# Patient Record
Sex: Female | Born: 1960 | Race: Black or African American | Hispanic: No | Marital: Married | Smoking: Never smoker
Health system: Southern US, Community
[De-identification: ages and names within clinical notes are randomized; demographics above are authoritative.]

## PROBLEM LIST (undated history)

## (undated) DIAGNOSIS — E119 Type 2 diabetes mellitus without complications: Secondary | ICD-10-CM

## (undated) DIAGNOSIS — G4730 Sleep apnea, unspecified: Secondary | ICD-10-CM

## (undated) DIAGNOSIS — F1910 Other psychoactive substance abuse, uncomplicated: Secondary | ICD-10-CM

## (undated) DIAGNOSIS — M797 Fibromyalgia: Secondary | ICD-10-CM

## (undated) DIAGNOSIS — G8929 Other chronic pain: Secondary | ICD-10-CM

## (undated) DIAGNOSIS — M7918 Myalgia, other site: Secondary | ICD-10-CM

## (undated) DIAGNOSIS — R202 Paresthesia of skin: Secondary | ICD-10-CM

## (undated) DIAGNOSIS — M7060 Trochanteric bursitis, unspecified hip: Secondary | ICD-10-CM

## (undated) DIAGNOSIS — I10 Essential (primary) hypertension: Secondary | ICD-10-CM

## (undated) DIAGNOSIS — R51 Headache: Secondary | ICD-10-CM

## (undated) DIAGNOSIS — F419 Anxiety disorder, unspecified: Secondary | ICD-10-CM

## (undated) DIAGNOSIS — M47816 Spondylosis without myelopathy or radiculopathy, lumbar region: Secondary | ICD-10-CM

## (undated) DIAGNOSIS — M461 Sacroiliitis, not elsewhere classified: Secondary | ICD-10-CM

## (undated) DIAGNOSIS — M5417 Radiculopathy, lumbosacral region: Secondary | ICD-10-CM

## (undated) DIAGNOSIS — E7800 Pure hypercholesterolemia, unspecified: Secondary | ICD-10-CM

## (undated) DIAGNOSIS — M1990 Unspecified osteoarthritis, unspecified site: Secondary | ICD-10-CM

## (undated) DIAGNOSIS — T7840XA Allergy, unspecified, initial encounter: Secondary | ICD-10-CM

## (undated) HISTORY — DX: Other chronic pain: G89.29

## (undated) HISTORY — DX: Other psychoactive substance abuse, uncomplicated: F19.10

## (undated) HISTORY — DX: Trochanteric bursitis, unspecified hip: M70.60

## (undated) HISTORY — DX: Allergy, unspecified, initial encounter: T78.40XA

## (undated) HISTORY — PX: COLONOSCOPY: SHX174

## (undated) HISTORY — PX: OVARY SURGERY: SHX727

## (undated) HISTORY — DX: Sacroiliitis, not elsewhere classified: M46.1

## (undated) HISTORY — DX: Pure hypercholesterolemia, unspecified: E78.00

## (undated) HISTORY — PX: ABDOMINAL HYSTERECTOMY: SHX81

## (undated) HISTORY — DX: Radiculopathy, lumbosacral region: M54.17

## (undated) HISTORY — DX: Paresthesia of skin: R20.2

## (undated) HISTORY — PX: JOINT REPLACEMENT: SHX530

## (undated) HISTORY — DX: Spondylosis without myelopathy or radiculopathy, lumbar region: M47.816

## (undated) HISTORY — DX: Myalgia, other site: M79.18

---

## 1995-01-22 HISTORY — PX: ABDOMINAL HYSTERECTOMY: SHX81

## 1998-05-31 ENCOUNTER — Other Ambulatory Visit: Admission: RE | Admit: 1998-05-31 | Discharge: 1998-05-31 | Payer: Self-pay | Admitting: Family Medicine

## 1999-02-25 ENCOUNTER — Encounter: Payer: Self-pay | Admitting: Emergency Medicine

## 1999-02-25 ENCOUNTER — Emergency Department (HOSPITAL_COMMUNITY): Admission: EM | Admit: 1999-02-25 | Discharge: 1999-02-25 | Payer: Self-pay | Admitting: Emergency Medicine

## 2001-03-26 ENCOUNTER — Other Ambulatory Visit: Admission: RE | Admit: 2001-03-26 | Discharge: 2001-03-26 | Payer: Self-pay | Admitting: Family Medicine

## 2001-08-07 ENCOUNTER — Emergency Department (HOSPITAL_COMMUNITY): Admission: EM | Admit: 2001-08-07 | Discharge: 2001-08-07 | Payer: Self-pay | Admitting: Emergency Medicine

## 2001-08-07 ENCOUNTER — Encounter: Payer: Self-pay | Admitting: Emergency Medicine

## 2002-12-20 ENCOUNTER — Encounter: Admission: RE | Admit: 2002-12-20 | Discharge: 2002-12-20 | Payer: Self-pay | Admitting: Family Medicine

## 2004-01-22 HISTORY — PX: GASTRIC BYPASS: SHX52

## 2004-04-18 ENCOUNTER — Ambulatory Visit: Payer: Self-pay | Admitting: Internal Medicine

## 2004-04-20 ENCOUNTER — Ambulatory Visit (HOSPITAL_COMMUNITY): Admission: RE | Admit: 2004-04-20 | Discharge: 2004-04-20 | Payer: Self-pay | Admitting: Internal Medicine

## 2004-05-08 ENCOUNTER — Encounter: Admission: RE | Admit: 2004-05-08 | Discharge: 2004-05-08 | Payer: Self-pay | Admitting: Family Medicine

## 2004-05-17 ENCOUNTER — Ambulatory Visit: Payer: Self-pay | Admitting: Pulmonary Disease

## 2004-05-31 ENCOUNTER — Ambulatory Visit (HOSPITAL_BASED_OUTPATIENT_CLINIC_OR_DEPARTMENT_OTHER): Admission: RE | Admit: 2004-05-31 | Discharge: 2004-05-31 | Payer: Self-pay | Admitting: Pulmonary Disease

## 2004-06-12 ENCOUNTER — Ambulatory Visit: Payer: Self-pay | Admitting: Pulmonary Disease

## 2004-06-19 ENCOUNTER — Ambulatory Visit: Payer: Self-pay | Admitting: Pulmonary Disease

## 2004-09-28 ENCOUNTER — Ambulatory Visit (HOSPITAL_COMMUNITY): Admission: RE | Admit: 2004-09-28 | Discharge: 2004-09-28 | Payer: Self-pay | Admitting: *Deleted

## 2004-10-03 ENCOUNTER — Encounter: Admission: RE | Admit: 2004-10-03 | Discharge: 2005-01-01 | Payer: Self-pay | Admitting: *Deleted

## 2004-10-09 ENCOUNTER — Ambulatory Visit (HOSPITAL_COMMUNITY): Admission: RE | Admit: 2004-10-09 | Discharge: 2004-10-09 | Payer: Self-pay | Admitting: *Deleted

## 2004-10-16 ENCOUNTER — Ambulatory Visit: Payer: Self-pay | Admitting: Pulmonary Disease

## 2004-11-20 ENCOUNTER — Ambulatory Visit (HOSPITAL_COMMUNITY): Admission: RE | Admit: 2004-11-20 | Discharge: 2004-11-20 | Payer: Self-pay | Admitting: *Deleted

## 2005-01-01 ENCOUNTER — Inpatient Hospital Stay (HOSPITAL_COMMUNITY): Admission: RE | Admit: 2005-01-01 | Discharge: 2005-01-04 | Payer: Self-pay | Admitting: *Deleted

## 2005-01-10 ENCOUNTER — Encounter: Admission: RE | Admit: 2005-01-10 | Discharge: 2005-04-10 | Payer: Self-pay | Admitting: *Deleted

## 2005-05-14 ENCOUNTER — Encounter: Admission: RE | Admit: 2005-05-14 | Discharge: 2005-05-14 | Payer: Self-pay | Admitting: Family Medicine

## 2006-05-19 ENCOUNTER — Encounter: Admission: RE | Admit: 2006-05-19 | Discharge: 2006-05-19 | Payer: Self-pay | Admitting: Family Medicine

## 2007-05-20 ENCOUNTER — Encounter: Admission: RE | Admit: 2007-05-20 | Discharge: 2007-05-20 | Payer: Self-pay | Admitting: Family Medicine

## 2007-12-25 ENCOUNTER — Other Ambulatory Visit: Admission: RE | Admit: 2007-12-25 | Discharge: 2007-12-25 | Payer: Self-pay | Admitting: Family Medicine

## 2008-05-23 ENCOUNTER — Encounter: Admission: RE | Admit: 2008-05-23 | Discharge: 2008-05-23 | Payer: Self-pay | Admitting: Family Medicine

## 2009-02-14 ENCOUNTER — Encounter: Admission: RE | Admit: 2009-02-14 | Discharge: 2009-02-14 | Payer: Self-pay | Admitting: Family Medicine

## 2009-06-15 ENCOUNTER — Encounter: Admission: RE | Admit: 2009-06-15 | Discharge: 2009-06-15 | Payer: Self-pay | Admitting: Family Medicine

## 2009-08-03 ENCOUNTER — Encounter: Admission: RE | Admit: 2009-08-03 | Discharge: 2009-08-03 | Payer: Self-pay | Admitting: Surgery

## 2010-01-21 HISTORY — PX: BUNIONECTOMY: SHX129

## 2010-02-10 ENCOUNTER — Encounter: Payer: Self-pay | Admitting: Family Medicine

## 2010-06-08 NOTE — Discharge Summary (Signed)
Kelsey Simmons, Kelsey Simmons                ACCOUNT NO.:  [id number]   MEDICAL RECORD NO.:  [id number]          PATIENT TYPE:  INP   LOCATION:  1510                         FACILITY:  [facility]   PHYSICIAN:  Alfonse Ras, MD   DATE OF BIRTH:  15-Dec-1960   DATE OF ADMISSION:  01/01/2005  DATE OF DISCHARGE:  01/04/2005                                 DISCHARGE SUMMARY   ADMISSION DIAGNOSES:  1.  Morbid obesity.  2.  Sleep apnea.  3.  Fibromyalgia.  4.  Mild hypertension.   PROCEDURES:  Laparoscopic Roux-en-Y gastric bypass.   CONDITION ON DISCHARGE:  Stable, good, and improved.   FOLLOW-UP:  With me 1 week after discharge for staple removal.   DISPOSITION:  Discharged to home.   MEDICATIONS:  1.  Roxicet elixir.  2.  __________ 2 mg a day.  3.  Atenolol 100 mg day.  4.  Imitrex p.r.n..   HOSPITAL COURSE:  The patient was admitted after home bowel prep. She  underwent laparoscopic Roux-en-Y gastric bypass, did well. On postoperative  day #1 she underwent upper GI which showed no evidence of leak. She was  followed in the intensive care unit 1 day for her sleep apnea. She did well,  was transferred to the floor, was started on clear liquids and then to her  protein diet. She did well over the next 2 days and was discharged to home  on postoperative day #3 doing well.      Alfonse Ras, MD  Electronically Signed     KRE/MEDQ  D:  01/23/2005  T:  01/23/2005  Job:  [id number]

## 2010-06-08 NOTE — Procedures (Signed)
NAMEKAMEREN, BAADE NO.:  [id number]   MEDICAL RECORD NO.:  [id number]          PATIENT TYPE:  OUT   LOCATION:  SLEEP CENTER                 FACILITY:  [facility]   PHYSICIAN:  Marcelyn Bruins, M.D. [facility] DATE OF BIRTH:  1960/09/20   DATE OF STUDY:  05/31/2004                              NOCTURNAL POLYSOMNOGRAM   REFERRING PHYSICIAN:  Marcelyn Bruins, M.D.   LOCATION:  Sleep lab.   INDICATION FOR THE STUDY:  Hypersomnia with sleep apnea. Epworth score: 22.   SLEEP ARCHITECTURE:  The patient had a total sleep time of 389 minutes with  a sleep efficiency of 91%. There was very decreased slow wave sleep and  borderline REM quantity. Sleep onset latency was normal and REM onset was  somewhat prolonged.   IMPRESSION:  1.  Mild to moderate obstructive sleep apnea/hypopnea syndrome with a      respiratory disturbance index of 16 events per hour and O2 desaturation      as low as 77%. The events were not positional, but occurred primarily      during REM.  Treatment for this degree of sleep apnea can include weight      loss, upper airway surgery, oral appliance or possibly CPAP. Clinical      correlation is suggested.  2.  Mild to moderate snoring noted throughout.  3.  No clinically significant cardiac arrhythmias.      KC/MEDQ  D:  06/12/2004 16:46:52  T:  06/12/2004 18:58:39  Job:  [id number]

## 2010-06-08 NOTE — Op Note (Signed)
Kelsey Simmons, Kelsey Simmons                ACCOUNT NO.:  [id number]   MEDICAL RECORD NO.:  [id number]          PATIENT TYPE:  INP   LOCATION:  0154                         FAC[facility]   PHYSICIAN:  Vikki Ports, MDDATE OF BIRTH:  December 23, 1960   DATE OF PROCEDURE:  01/01/2005  DATE OF DISCHARGE:                                 OPERATIVE REPORT   PREOPERATIVE DIAGNOSIS:  Morbid obesity.   POSTOPERATIVE DIAGNOSIS:  Morbid obesity.   PROCEDURE:  Laparoscopic Roux-en-Y gastric bypass, left facing roux limb.   SURGEON:  Vikki Ports, MD.   ASSISTANT:  Glenna Fellows, MD.   ANESTHESIA:  General.   DESCRIPTION OF PROCEDURE:  The patient was taken to the operating room,  placed in a supine position and after adequate general anesthesia was  induced using the endotracheal tube, the abdomen was prepped and draped in  the normal sterile fashion. Using a left upper quadrant incision, a 12 mm  Optiview trocar was inserted and using direct vision, peritoneal access was  obtained without difficulty. Pneumoperitoneum was obtained. Under direct  vision, additional 12 mm trocars were placed in the right upper quadrant and  in the mid abdomen. The ligament of Treitz was identified and the small  bowel was transected using a 60 mm echelon white load stapling device 40 cm  distal to the ligament of Treitz. The distal end was marked with a Penrose  drain. Marching 100 cm distal to this, a side-to-side jejunojejunostomy was  performed in the standard fashion using a 45-mm white load GIA stapling  device. The defect was closed with running 2-0 Vicryl sutures. I was  satisfied with the anastomosis. The mesenteric defect was closed with a  running 2-0 silk suture. The anastomosis was inspected and reinforced using  Tisseel. I then turned my attention to the upper abdomen. The patient was  placed in steep head-up position. A Nathanson liver retractor was placed and  the left lateral  segment of liver was retracted anteriorly. The angle of His  was sharply and bluntly dissected. An area on the lesser curve was  identified 4 cm distal to the EG junction. Everything was removed from the  stomach and the lesser sac was entered. The 60 mm echelon stapler was used  to create the stomach pouch with serial firings of the echelon stapler. A  very nice small pouch was created up to the angle of His. The Ewald tube was  placed transorally to ensure patency of the EG junction. After a nice pouch  was created and adequate hemostasis was assured, the remnant was oversewn  with a running interlocking 2-0 silk suture. The Roux limb was then brought  up in a left facing position and laid next to the gastric pouch. The  posterior layer was then run with a 2-0 Vicryl suture. Gastrotomy and  enterotomy were then made and a 45 white load GIA stapler was then inserted  into the stomach and into the small bowel and anastomosis was created. This  laid nicely. The defect was closed with running 2-0 Vicryl suture. The Maricela Curet  was  then placed down through the anastomosis and the anterior serosal layer  was run with a 2-0 Vicryl suture. The anastomosis was inspected after the  Roux limb was clamped with a DeBakey. Dr. Johna Sheriff performed the endoscopy  which showed no evidence of leak and patency of the anastomosis. Peterson's  defect was closed with running 2-0 silk suture. The [facility] liver  retractor was removed. Of note, there  was a little bit of bleeding from the staple line prior to closure of the  gastrojejunostomy and this was controlled with clips. Pneumoperitoneum was  released after the [facility] liver retractor was removed. Trocars __________  incisions were closed with staples. The patient tolerated the procedure well  and went to PACU in good condition.      Vikki Ports, MD  Electronically Signed     KRH/MEDQ  D:  01/01/2005  T:  01/02/2005  Job:  [id number]

## 2010-06-08 NOTE — Op Note (Signed)
NAMEBLASA, RAISCH                ACCOUNT NO.:  [id number]   MEDICAL RECORD NO.:  [id number]          PATIENT TYPE:  INP   LOCATION:  0154                         FACILITY:  [facility]   PHYSICIAN:  Sharlet Salina T. Hoxworth, M.D.DATE OF BIRTH:  Jul 29, 1960   DATE OF PROCEDURE:  01/01/2005  DATE OF DISCHARGE:                                 OPERATIVE REPORT   a   PROCEDURE:  Upper GI endoscopy.   DESCRIPTION OF PROCEDURE:  Upper GI endoscopy is performed at the completion  of laparoscopic roux-en-Y gastric bypass by Dr. Luan Pulling. The Olympus  video endoscope was inserted into the upper esophagus and passed under  direct vision to the EG junction at approximately 40 cm. The small gastric  pouch was entered and was tensely distended with air and with the pouch  being inspected under saline irrigation with the outlet clamped, there was  no evidence of leak. The suture and staple lines were intact without  bleeding. The anastomosis was patent. The pouch measured 4.5 cm in length.  Following this, it was decompressed and the scope withdrawn. The patient  tolerated the procedure well.      Lorne Skeens. Hoxworth, M.D.  Electronically Signed     BTH/MEDQ  D:  01/01/2005  T:  01/02/2005  Job:  [id number]

## 2010-07-02 ENCOUNTER — Other Ambulatory Visit: Payer: Self-pay | Admitting: Family Medicine

## 2010-07-02 DIAGNOSIS — Z1231 Encounter for screening mammogram for malignant neoplasm of breast: Secondary | ICD-10-CM

## 2010-07-09 ENCOUNTER — Ambulatory Visit
Admission: RE | Admit: 2010-07-09 | Discharge: 2010-07-09 | Disposition: A | Discharge status: Home / Self Care | Payer: Self-pay | Source: Ambulatory Visit | Attending: Family Medicine | Admitting: Family Medicine

## 2010-07-09 DIAGNOSIS — Z1231 Encounter for screening mammogram for malignant neoplasm of breast: Secondary | ICD-10-CM

## 2010-07-12 ENCOUNTER — Other Ambulatory Visit: Payer: Self-pay | Admitting: Family Medicine

## 2010-07-12 DIAGNOSIS — R928 Other abnormal and inconclusive findings on diagnostic imaging of breast: Secondary | ICD-10-CM

## 2010-07-20 ENCOUNTER — Ambulatory Visit
Admission: RE | Admit: 2010-07-20 | Discharge: 2010-07-20 | Disposition: A | Discharge status: Home / Self Care | Payer: BC Managed Care – PPO | Source: Ambulatory Visit | Attending: Family Medicine | Admitting: Family Medicine

## 2010-07-20 DIAGNOSIS — R928 Other abnormal and inconclusive findings on diagnostic imaging of breast: Secondary | ICD-10-CM

## 2011-08-02 ENCOUNTER — Other Ambulatory Visit: Payer: Self-pay | Admitting: Family Medicine

## 2011-08-02 DIAGNOSIS — Z1231 Encounter for screening mammogram for malignant neoplasm of breast: Secondary | ICD-10-CM

## 2011-08-13 ENCOUNTER — Ambulatory Visit
Admission: RE | Admit: 2011-08-13 | Discharge: 2011-08-13 | Disposition: A | Discharge status: Home / Self Care | Payer: BC Managed Care – PPO | Source: Ambulatory Visit | Attending: Family Medicine | Admitting: Family Medicine

## 2011-08-13 DIAGNOSIS — Z1231 Encounter for screening mammogram for malignant neoplasm of breast: Secondary | ICD-10-CM

## 2012-07-29 ENCOUNTER — Other Ambulatory Visit: Payer: Self-pay

## 2012-07-29 DIAGNOSIS — Z1231 Encounter for screening mammogram for malignant neoplasm of breast: Secondary | ICD-10-CM

## 2012-08-19 ENCOUNTER — Ambulatory Visit
Admission: RE | Admit: 2012-08-19 | Discharge: 2012-08-19 | Disposition: A | Discharge status: Home / Self Care | Payer: BC Managed Care – PPO | Source: Ambulatory Visit

## 2012-08-19 DIAGNOSIS — Z1231 Encounter for screening mammogram for malignant neoplasm of breast: Secondary | ICD-10-CM

## 2012-11-09 DIAGNOSIS — M461 Sacroiliitis, not elsewhere classified: Secondary | ICD-10-CM | POA: Insufficient documentation

## 2013-07-20 ENCOUNTER — Other Ambulatory Visit: Payer: Self-pay | Admitting: Surgical

## 2013-07-29 ENCOUNTER — Other Ambulatory Visit (HOSPITAL_COMMUNITY): Payer: Self-pay | Admitting: Orthopedic Surgery

## 2013-07-29 DIAGNOSIS — M1711 Unilateral primary osteoarthritis, right knee: Secondary | ICD-10-CM

## 2013-07-30 ENCOUNTER — Ambulatory Visit (HOSPITAL_COMMUNITY)
Admission: RE | Admit: 2013-07-30 | Discharge: 2013-07-30 | Disposition: A | Discharge status: Home / Self Care | Payer: BC Managed Care – PPO | Source: Ambulatory Visit | Attending: Cardiovascular Disease | Admitting: Cardiovascular Disease

## 2013-07-30 DIAGNOSIS — M1710 Unilateral primary osteoarthritis, unspecified knee: Secondary | ICD-10-CM | POA: Insufficient documentation

## 2013-07-30 DIAGNOSIS — M1711 Unilateral primary osteoarthritis, right knee: Secondary | ICD-10-CM

## 2013-07-30 NOTE — Progress Notes (Signed)
Right Lower Extremity Venous Duplex Completed. No evidence for DVT or SVT. °Brianna L Mazza,RVT °

## 2013-08-03 ENCOUNTER — Telehealth (HOSPITAL_COMMUNITY): Payer: Self-pay | Admitting: *Deleted

## 2013-08-03 ENCOUNTER — Other Ambulatory Visit: Payer: Self-pay | Admitting: Surgical

## 2013-08-03 ENCOUNTER — Encounter (HOSPITAL_COMMUNITY): Payer: Self-pay | Admitting: Pharmacy Technician

## 2013-08-04 NOTE — H&P (Signed)
TOTAL KNEE ADMISSION H&P  Patient is being admitted for right total knee arthroplasty.  Subjective:  Chief Complaint:right knee pain.  HPI: Kelsey Simmons, 53 y.o. female, has a history of pain and functional disability in the right knee due to arthritis and has failed non-surgical conservative treatments for greater than 12 weeks to includeNSAID's and/or analgesics, corticosteriod injections, viscosupplementation injections, use of assistive devices, weight reduction as appropriate and activity modification.  Onset of symptoms was gradual, starting 5 years ago with gradually worsening course since that time. The patient noted no past surgery on the right knee(s).  Patient currently rates pain in the right knee(s) at 8 out of 10 with activity. Patient has night pain, worsening of pain with activity and weight bearing, pain that interferes with activities of daily living, pain with passive range of motion, crepitus and joint swelling.  Patient has evidence of periarticular osteophytes and joint space narrowing by imaging studies. There is no active infection.  Past Medical History Migraines Hypertension Sleep Apnea (not currently using CPAP) Varicose Veins Osteoarthritis Fibromyalgia Degenerative Disc Disease, lumbar spine  Past Surgical History Cesarean Delivery Hysterectomy Gastric Bypass Bunionectomy  No Known Allergies    Current outpatient prescriptions: aspirin EC 325 MG tablet, Take 325 mg by mouth 2 (two) times daily., Disp: , Rfl: ;   celecoxib (CELEBREX) 200 MG capsule, Take 200 mg by mouth daily., Disp: , Rfl: ;   DULoxetine (CYMBALTA) 60 MG capsule, Take 60 mg by mouth every evening., Disp: , Rfl: ;   hydrochlorothiazide (HYDRODIURIL) 25 MG tablet, Take 25 mg by mouth every morning., Disp: , Rfl:  SUMAtriptan (IMITREX) 100 MG tablet, Take 100 mg by mouth every 2 (two) hours as needed for migraine or headache. May repeat in 2 hours if headache persists or recurs., Disp: ,  Rfl: ;  traMADol (ULTRAM) 50 MG tablet, Take 50 mg by mouth every 6 (six) hours as needed (Pain)., Disp: , Rfl:   History  Substance Use Topics  . Smoking status: No  . Smokeless tobacco: No  . Alcohol Use: No    Family History Lung cancer Diabetes mellitus type II Lupus Hypertension   Review of Systems  Constitutional: Negative.   HENT: Negative for congestion, ear discharge, ear pain, hearing loss, nosebleeds, sore throat and tinnitus.        History of migraines  Eyes: Negative.   Respiratory: Negative.  Negative for stridor.   Cardiovascular: Negative.   Gastrointestinal: Negative.   Genitourinary: Negative.   Musculoskeletal: Positive for back pain and joint pain. Negative for falls, myalgias and neck pain.       Bilateral knee pain  Skin: Negative.   Neurological: Positive for headaches. Negative for dizziness, tingling, tremors, sensory change, speech change, focal weakness, seizures and loss of consciousness.  Endo/Heme/Allergies: Negative.   Psychiatric/Behavioral: Negative.     Objective:  Physical Exam  Constitutional: She is oriented to person, place, and time. She appears well-developed. No distress.  Obese  HENT:  Head: Normocephalic and atraumatic.  Right Ear: External ear normal.  Left Ear: External ear normal.  Nose: Nose normal.  Mouth/Throat: Oropharynx is clear and moist.  Eyes: Conjunctivae and EOM are normal.  Neck: Normal range of motion. Neck supple.  Cardiovascular: Normal rate, regular rhythm, normal heart sounds and intact distal pulses.   No murmur heard. Respiratory: Effort normal and breath sounds normal. No respiratory distress. She has no wheezes.  GI: Soft. Bowel sounds are normal. She exhibits no distension. There is  no tenderness.  Musculoskeletal:       Right hip: Normal.       Left hip: Normal.       Right knee: She exhibits decreased range of motion and swelling. She exhibits no effusion and no erythema. Tenderness found.  Medial joint line and lateral joint line tenderness noted.       Left knee: She exhibits decreased range of motion and swelling. She exhibits no effusion and no erythema. Tenderness found. Medial joint line and lateral joint line tenderness noted.       Right lower leg: She exhibits no tenderness and no swelling.       Left lower leg: She exhibits no tenderness and no swelling.  Neurological: She is alert and oriented to person, place, and time. She has normal strength and normal reflexes. No sensory deficit.  Skin: No rash noted. No erythema.  Psychiatric: She has a normal mood and affect. Her behavior is normal.   Vitals  Weight: 242 lb Height: 61in Body Surface Area: 2.17 m Body Mass Index: 45.73 kg/m Pulse: 76 (Regular)  BP: 142/88 (Sitting, Left Arm, Standard)  Imaging Review Plain radiographs demonstrate severe degenerative joint disease of the right knee(s). The overall alignment ismild varus. The bone quality appears to be good for age and reported activity level.  Assessment/Plan:  End stage arthritis, right knee   The patient history, physical examination, clinical judgment of the provider and imaging studies are consistent with end stage degenerative joint disease of the right knee(s) and total knee arthroplasty is deemed medically necessary. The treatment options including medical management, injection therapy arthroscopy and arthroplasty were discussed at length. The risks and benefits of total knee arthroplasty were presented and reviewed. The risks due to aseptic loosening, infection, stiffness, patella tracking problems, thromboembolic complications and other imponderables were discussed. The patient acknowledged the explanation, agreed to proceed with the plan and consent was signed. Patient is being admitted for inpatient treatment for surgery, pain control, PT, OT, prophylactic antibiotics, VTE prophylaxis, progressive ambulation and ADL's and discharge planning.  The patient is planning to be discharged home with home health services    North HartlandAmber Hanna Aultman, [place] JerseyPA-C

## 2013-08-05 ENCOUNTER — Other Ambulatory Visit (HOSPITAL_COMMUNITY): Payer: Self-pay | Admitting: *Deleted

## 2013-08-06 ENCOUNTER — Ambulatory Visit (HOSPITAL_COMMUNITY)
Admission: RE | Admit: 2013-08-06 | Discharge: 2013-08-06 | Disposition: A | Discharge status: Home / Self Care | Payer: BC Managed Care – PPO | Source: Ambulatory Visit | Attending: Surgical | Admitting: Surgical

## 2013-08-06 ENCOUNTER — Encounter (HOSPITAL_COMMUNITY): Payer: Self-pay

## 2013-08-06 ENCOUNTER — Encounter (INDEPENDENT_AMBULATORY_CARE_PROVIDER_SITE_OTHER): Payer: Self-pay

## 2013-08-06 ENCOUNTER — Encounter (HOSPITAL_COMMUNITY)
Admission: RE | Admit: 2013-08-06 | Discharge: 2013-08-06 | Disposition: A | Discharge status: Home / Self Care | Payer: BC Managed Care – PPO | Source: Ambulatory Visit | Attending: Orthopedic Surgery | Admitting: Orthopedic Surgery

## 2013-08-06 DIAGNOSIS — I10 Essential (primary) hypertension: Secondary | ICD-10-CM | POA: Insufficient documentation

## 2013-08-06 DIAGNOSIS — M47814 Spondylosis without myelopathy or radiculopathy, thoracic region: Secondary | ICD-10-CM | POA: Insufficient documentation

## 2013-08-06 DIAGNOSIS — Z01810 Encounter for preprocedural cardiovascular examination: Secondary | ICD-10-CM | POA: Insufficient documentation

## 2013-08-06 DIAGNOSIS — Z01812 Encounter for preprocedural laboratory examination: Secondary | ICD-10-CM | POA: Insufficient documentation

## 2013-08-06 DIAGNOSIS — Z01818 Encounter for other preprocedural examination: Secondary | ICD-10-CM | POA: Insufficient documentation

## 2013-08-06 HISTORY — DX: Headache: R51

## 2013-08-06 HISTORY — DX: Fibromyalgia: M79.7

## 2013-08-06 HISTORY — DX: Essential (primary) hypertension: I10

## 2013-08-06 HISTORY — DX: Unspecified osteoarthritis, unspecified site: M19.90

## 2013-08-06 HISTORY — DX: Sleep apnea, unspecified: G47.30

## 2013-08-06 LAB — URINALYSIS, ROUTINE W REFLEX MICROSCOPIC
Bilirubin Urine: NEGATIVE
Glucose, UA: NEGATIVE mg/dL
Hgb urine dipstick: NEGATIVE
Ketones, ur: NEGATIVE mg/dL
Leukocytes, UA: NEGATIVE
Nitrite: NEGATIVE
Protein, ur: NEGATIVE mg/dL
Specific Gravity, Urine: 1.023 (ref 1.005–1.030)
Urobilinogen, UA: 1 mg/dL (ref 0.0–1.0)
pH: 5.5 (ref 5.0–8.0)

## 2013-08-06 LAB — COMPREHENSIVE METABOLIC PANEL
ALT: 13 U/L (ref 0–35)
AST: 24 U/L (ref 0–37)
Albumin: 3.5 g/dL (ref 3.5–5.2)
Alkaline Phosphatase: 82 U/L (ref 39–117)
Anion gap: 13 (ref 5–15)
BUN: 23 mg/dL (ref 6–23)
CO2: 29 mEq/L (ref 19–32)
Calcium: 10 mg/dL (ref 8.4–10.5)
Chloride: 98 mEq/L (ref 96–112)
Creatinine, Ser: 0.97 mg/dL (ref 0.50–1.10)
GFR calc Af Amer: 77 mL/min — ABNORMAL LOW (ref >90)
GFR calc non Af Amer: 66 mL/min — ABNORMAL LOW (ref >90)
Glucose, Bld: 95 mg/dL (ref 70–99)
Potassium: 4.5 mEq/L (ref 3.7–5.3)
Sodium: 140 mEq/L (ref 137–147)
Total Bilirubin: <0.2 mg/dL — ABNORMAL LOW (ref 0.3–1.2)
Total Protein: 8.1 g/dL (ref 6.0–8.3)

## 2013-08-06 LAB — SURGICAL PCR SCREEN
MRSA, PCR: NEGATIVE
STAPHYLOCOCCUS AUREUS: NEGATIVE

## 2013-08-06 LAB — CBC
HCT: 39.8 % (ref 36.0–46.0)
HEMOGLOBIN: 12.8 g/dL (ref 12.0–15.0)
MCH: 27.8 pg (ref 26.0–34.0)
MCHC: 32.2 g/dL (ref 30.0–36.0)
MCV: 86.5 fL (ref 78.0–100.0)
Platelets: 288 10*3/uL (ref 150–400)
RBC: 4.6 MIL/uL (ref 3.87–5.11)
RDW: 15.3 % (ref 11.5–15.5)
WBC: 9.9 10*3/uL (ref 4.0–10.5)

## 2013-08-06 LAB — PROTIME-INR
INR: 1.07 (ref 0.00–1.49)
Prothrombin Time: 13.9 seconds (ref 11.6–15.2)

## 2013-08-06 LAB — APTT: aPTT: 31 seconds (ref 24–37)

## 2013-08-06 NOTE — Patient Instructions (Addendum)
Kelsey Simmons  08/06/2013                           YOUR PROCEDURE IS SCHEDULED ON: 08/18/13 AT 10:00 AM               ENTER THRU [facility] MAIN [facility]ITAL ENTRANCE AND                            FOLLOW  SIGNS TO SHORT STAY CENTER                 ARRIVE AT SHORT STAY AT: 8:00 AM               CALL THIS NUMBER IF ANY PROBLEMS THE DAY OF SURGERY :               [phone]                                REMEMBER:   Do not eat food or drink liquids AFTER MIDNIGHT                 Take these medicines the morning of surgery with               A SIPS OF WATER :  ULTRAM IF NEEDED       Do not wear jewelry, make-up   Do not wear lotions, powders, or perfumes.   Do not shave legs or underarms 12 hrs. before surgery (men may shave face)  Do not bring valuables to the hospital.  Contacts, dentures or bridgework may not be worn into surgery.  Leave suitcase in the car. After surgery it may be brought to your room.  For patients admitted to the hospital more than one night, checkout time is            11:00 AM                                                       ________________________________________________________________________                                                                        [place] - PREPARING FOR SURGERY  Before surgery, you can play an important role.  Because skin is not sterile, your skin needs to be as free of germs as possible.  You can reduce the number of germs on your skin by washing with CHG (chlorahexidine gluconate) soap before surgery.  CHG is an antiseptic cleaner which kills germs and bonds with the skin to continue killing germs even after washing. Please DO NOT use if you have an allergy to CHG or antibacterial soaps.  If your skin becomes reddened/irritated stop using the CHG and inform your nurse when you arrive at Short Stay. Do not shave (including legs and underarms) for at least 48 hours prior to the first CHG shower.   You  may shave your face. Please follow these instructions carefully:   1.  Shower with CHG Soap the night before surgery and the  morning of Surgery.   2.  If you choose to wash your hair, wash your hair first as usual with your  normal  Shampoo.   3.  After you shampoo, rinse your hair and body thoroughly to remove the  shampoo.                                         4.  Use CHG as you would any other liquid soap.  You can apply chg directly  to the skin and wash . Gently wash with scrungie or clean wascloth    5.  Apply the CHG Soap to your body ONLY FROM THE NECK DOWN.   Do not use on open                           Wound or open sores. Avoid contact with eyes, ears mouth and genitals (private parts).                        Genitals (private parts) with your normal soap.              6.  Wash thoroughly, paying special attention to the area where your surgery  will be performed.   7.  Thoroughly rinse your body with warm water from the neck down.   8.  DO NOT shower/wash with your normal soap after using and rinsing off  the CHG Soap .                9.  Pat yourself dry with a clean towel.             10.  Wear clean pajamas.             11.  Place clean sheets on your bed the night of your first shower and do not  sleep with pets.  Day of Surgery : Do not apply any lotions/deodorants the morning of surgery.  Please wear clean clothes to the hospital/surgery center.  FAILURE TO FOLLOW THESE INSTRUCTIONS MAY RESULT IN THE CANCELLATION OF YOUR SURGERY    PATIENT SIGNATURE_________________________________  ______________________________________________________________________     [name]  An incentive spirometer is a tool that can help keep your lungs clear and active. This tool measures how well you are filling your lungs with each breath. Taking long deep breaths may help reverse or decrease the chance of developing breathing (pulmonary) problems  (especially infection) following:  A long period of time when you are unable to move or be active. BEFORE THE PROCEDURE   If the spirometer includes an indicator to show your best effort, your nurse or respiratory therapist will set it to a desired goal.  If possible, sit up straight or lean slightly forward. Try not to slouch.  Hold the incentive spirometer in an upright position. INSTRUCTIONS FOR USE  1. Sit on the edge of your bed if possible, or sit up as far as you can in bed or on a chair. 2. Hold the incentive spirometer in an upright position. 3. Breathe out normally. 4. Place the mouthpiece in your mouth and seal your lips tightly around it. 5. Breathe in slowly and as deeply  as possible, raising the piston or the ball toward the top of the column. 6. Hold your breath for 3-5 seconds or for as long as possible. Allow the piston or ball to fall to the bottom of the column. 7. Remove the mouthpiece from your mouth and breathe out normally. 8. Rest for a few seconds and repeat Steps 1 through 7 at least 10 times every 1-2 hours when you are awake. Take your time and take a few normal breaths between deep breaths. 9. The spirometer may include an indicator to show your best effort. Use the indicator as a goal to work toward during each repetition. 10. After each set of 10 deep breaths, practice coughing to be sure your lungs are clear. If you have an incision (the cut made at the time of surgery), support your incision when coughing by placing a pillow or rolled up towels firmly against it. Once you are able to get out of bed, walk around indoors and cough well. You may stop using the incentive spirometer when instructed by your caregiver.  RISKS AND COMPLICATIONS  Take your time so you do not get dizzy or light-headed.  If you are in pain, you may need to take or ask for pain medication before doing incentive spirometry. It is harder to take a deep breath if you are having  pain. AFTER USE  Rest and breathe slowly and easily.  It can be helpful to keep track of a log of your progress. Your caregiver can provide you with a simple table to help with this. If you are using the spirometer at home, follow these instructions: SEEK MEDICAL CARE IF:   You are having difficultly using the spirometer.  You have trouble using the spirometer as often as instructed.  Your pain medication is not giving enough relief while using the spirometer.  You develop fever of 100.5 F (38.1 C) or higher. SEEK IMMEDIATE MEDICAL CARE IF:   You cough up bloody sputum that had not been present before.  You develop fever of 102 F (38.9 C) or greater.  You develop worsening pain at or near the incision site. MAKE SURE YOU:   Understand these instructions.  Will watch your condition.  Will get help right away if you are not doing well or get worse. Document Released: 05/20/2006 Document Revised: 04/01/2011 Document Reviewed: 07/21/2006 ExitCare Patient Information 2014 ExitCare, [place].   ________________________________________________________________________  WHAT IS A BLOOD TRANSFUSION? Blood Transfusion Information  A transfusion is the replacement of blood or some of its parts. Blood is made up of multiple cells which provide different functions.  Red blood cells carry oxygen and are used for blood loss replacement.  White blood cells fight against infection.  Platelets control bleeding.  Plasma helps clot blood.  Other blood products are available for specialized needs, such as hemophilia or other clotting disorders. BEFORE THE TRANSFUSION  Who gives blood for transfusions?   Healthy volunteers who are fully evaluated to make sure their blood is safe. This is blood bank blood. Transfusion therapy is the safest it has ever been in the practice of medicine. Before blood is taken from a donor, a complete history is taken to make sure that person has no history  of diseases nor engages in risky social behavior (examples are intravenous drug use or sexual activity with multiple partners). The donor's travel history is screened to minimize risk of transmitting infections, such as malaria. The donated blood is tested for signs of infectious diseases,  such as HIV and hepatitis. The blood is then tested to be sure it is compatible with you in order to minimize the chance of a transfusion reaction. If you or a relative donates blood, this is often done in anticipation of surgery and is not appropriate for emergency situations. It takes many days to process the donated blood. RISKS AND COMPLICATIONS Although transfusion therapy is very safe and saves many lives, the main dangers of transfusion include:   Getting an infectious disease.  Developing a transfusion reaction. This is an allergic reaction to something in the blood you were given. Every precaution is taken to prevent this. The decision to have a blood transfusion has been considered carefully by your caregiver before blood is given. Blood is not given unless the benefits outweigh the risks. AFTER THE TRANSFUSION  Right after receiving a blood transfusion, you will usually feel much better and more energetic. This is especially true if your red blood cells have gotten low (anemic). The transfusion raises the level of the red blood cells which carry oxygen, and this usually causes an energy increase.  The nurse administering the transfusion will monitor you carefully for complications. HOME CARE INSTRUCTIONS  No special instructions are needed after a transfusion. You may find your energy is better. Speak with your caregiver about any limitations on activity for underlying diseases you may have. SEEK MEDICAL CARE IF:   Your condition is not improving after your transfusion.  You develop redness or irritation at the intravenous (IV) site. SEEK IMMEDIATE MEDICAL CARE IF:  Any of the following symptoms  occur over the next 12 hours:  Shaking chills.  You have a temperature by mouth above 102 F (38.9 C), not controlled by medicine.  Chest, back, or muscle pain.  People around you feel you are not acting correctly or are confused.  Shortness of breath or difficulty breathing.  Dizziness and fainting.  You get a rash or develop hives.  You have a decrease in urine output.  Your urine turns a dark color or changes to pink, red, or brown. Any of the following symptoms occur over the next 10 days:  You have a temperature by mouth above 102 F (38.9 C), not controlled by medicine.  Shortness of breath.  Weakness after normal activity.  The white part of the eye turns yellow (jaundice).  You have a decrease in the amount of urine or are urinating less often.  Your urine turns a dark color or changes to pink, red, or brown. Document Released: 01/05/2000 Document Revised: 04/01/2011 Document Reviewed: 08/24/2007 [facility] Patient Information 2014 [place], [place].  _______________________________________________________________________

## 2013-08-06 NOTE — Progress Notes (Signed)
08/06/13 1405  OBSTRUCTIVE SLEEP APNEA  Have you ever been diagnosed with sleep apnea through a sleep study? Yes  If yes, do you have and use a CPAP or BPAP machine every night? 0 (stopped using c pap 2007 after wt loss)  Do you snore loudly (loud enough to be heard through closed doors)?  1  Do you often feel tired, fatigued, or sleepy during the daytime? 1  Has anyone observed you stop breathing during your sleep? 1  Do you have, or are you being treated for high blood pressure? 1  BMI more than 35 kg/m2? 1  Age over 52 years old? 1  Neck circumference greater than 40 cm/16 inches? 0  Gender: 0  Obstructive Sleep Apnea Score 6

## 2013-08-18 ENCOUNTER — Encounter (HOSPITAL_COMMUNITY)
Admission: RE | Disposition: A | Discharge status: Home Health | Payer: Self-pay | Source: Ambulatory Visit | Attending: Orthopedic Surgery

## 2013-08-18 ENCOUNTER — Inpatient Hospital Stay (HOSPITAL_COMMUNITY): Payer: BC Managed Care – PPO

## 2013-08-18 ENCOUNTER — Encounter (HOSPITAL_COMMUNITY): Payer: BC Managed Care – PPO | Admitting: Anesthesiology

## 2013-08-18 ENCOUNTER — Inpatient Hospital Stay (HOSPITAL_COMMUNITY): Payer: BC Managed Care – PPO | Admitting: Anesthesiology

## 2013-08-18 ENCOUNTER — Encounter (HOSPITAL_COMMUNITY): Payer: Self-pay | Admitting: *Deleted

## 2013-08-18 ENCOUNTER — Inpatient Hospital Stay (HOSPITAL_COMMUNITY)
Admission: RE | Admit: 2013-08-18 | Discharge: 2013-08-21 | DRG: 470 | Disposition: A | Discharge status: Home Health | Payer: BC Managed Care – PPO | Source: Ambulatory Visit | Attending: Orthopedic Surgery | Admitting: Orthopedic Surgery

## 2013-08-18 DIAGNOSIS — M1711 Unilateral primary osteoarthritis, right knee: Secondary | ICD-10-CM | POA: Diagnosis present

## 2013-08-18 DIAGNOSIS — M51379 Other intervertebral disc degeneration, lumbosacral region without mention of lumbar back pain or lower extremity pain: Secondary | ICD-10-CM | POA: Diagnosis present

## 2013-08-18 DIAGNOSIS — M24569 Contracture, unspecified knee: Secondary | ICD-10-CM | POA: Diagnosis present

## 2013-08-18 DIAGNOSIS — Z833 Family history of diabetes mellitus: Secondary | ICD-10-CM

## 2013-08-18 DIAGNOSIS — M1710 Unilateral primary osteoarthritis, unspecified knee: Principal | ICD-10-CM | POA: Diagnosis present

## 2013-08-18 DIAGNOSIS — Z96651 Presence of right artificial knee joint: Secondary | ICD-10-CM

## 2013-08-18 DIAGNOSIS — I10 Essential (primary) hypertension: Secondary | ICD-10-CM | POA: Diagnosis present

## 2013-08-18 DIAGNOSIS — Z801 Family history of malignant neoplasm of trachea, bronchus and lung: Secondary | ICD-10-CM

## 2013-08-18 DIAGNOSIS — Z79899 Other long term (current) drug therapy: Secondary | ICD-10-CM

## 2013-08-18 DIAGNOSIS — Z6841 Body Mass Index (BMI) 40.0 and over, adult: Secondary | ICD-10-CM

## 2013-08-18 DIAGNOSIS — Z9884 Bariatric surgery status: Secondary | ICD-10-CM

## 2013-08-18 DIAGNOSIS — G4730 Sleep apnea, unspecified: Secondary | ICD-10-CM | POA: Diagnosis present

## 2013-08-18 DIAGNOSIS — Z96659 Presence of unspecified artificial knee joint: Secondary | ICD-10-CM

## 2013-08-18 DIAGNOSIS — M5137 Other intervertebral disc degeneration, lumbosacral region: Secondary | ICD-10-CM | POA: Diagnosis present

## 2013-08-18 DIAGNOSIS — IMO0001 Reserved for inherently not codable concepts without codable children: Secondary | ICD-10-CM | POA: Diagnosis present

## 2013-08-18 DIAGNOSIS — Z7982 Long term (current) use of aspirin: Secondary | ICD-10-CM

## 2013-08-18 HISTORY — PX: TOTAL KNEE ARTHROPLASTY: SHX125

## 2013-08-18 LAB — ABO/RH: ABO/RH(D): B POS

## 2013-08-18 LAB — TYPE AND SCREEN
ABO/RH(D): B POS
Antibody Screen: NEGATIVE

## 2013-08-18 SURGERY — ARTHROPLASTY, KNEE, TOTAL
Anesthesia: General | Site: Knee | Laterality: Right

## 2013-08-18 MED ORDER — CHLORHEXIDINE GLUCONATE 4 % EX LIQD: 60.0000 mL | Freq: Once | CUTANEOUS | Status: DC

## 2013-08-18 MED ORDER — CEFAZOLIN SODIUM 1-5 GM-% IV SOLN
1.0000 g | Freq: Four times a day (QID) | INTRAVENOUS | Status: AC
  Administered 2013-08-18 (×2): 1 g via INTRAVENOUS
  Filled 2013-08-18 (×2): qty 50

## 2013-08-18 MED ORDER — MIDAZOLAM HCL 5 MG/5ML IJ SOLN
INTRAMUSCULAR | Status: DC | PRN
  Administered 2013-08-18 (×2): 1 mg via INTRAVENOUS

## 2013-08-18 MED ORDER — CEFAZOLIN SODIUM-DEXTROSE 2-3 GM-% IV SOLR
INTRAVENOUS | Status: AC
  Filled 2013-08-18: qty 50

## 2013-08-18 MED ORDER — VERAPAMIL HCL ER 180 MG PO TBCR
180.0000 mg | EXTENDED_RELEASE_TABLET | Freq: Every day | ORAL | Status: DC
  Administered 2013-08-18 – 2013-08-20 (×3): 180 mg via ORAL
  Filled 2013-08-18 (×4): qty 1

## 2013-08-18 MED ORDER — METHOCARBAMOL 1000 MG/10ML IJ SOLN
500.0000 mg | Freq: Four times a day (QID) | INTRAMUSCULAR | Status: DC | PRN
  Administered 2013-08-18: 500 mg via INTRAVENOUS
  Filled 2013-08-18: qty 5

## 2013-08-18 MED ORDER — EPHEDRINE SULFATE 50 MG/ML IJ SOLN
INTRAMUSCULAR | Status: DC | PRN
  Administered 2013-08-18 (×5): 10 mg via INTRAVENOUS

## 2013-08-18 MED ORDER — FLEET ENEMA 7-19 GM/118ML RE ENEM: 1.0000 | ENEMA | Freq: Once | RECTAL | Status: AC | PRN

## 2013-08-18 MED ORDER — LACTATED RINGERS IV SOLN
INTRAVENOUS | Status: DC
  Administered 2013-08-18 – 2013-08-19 (×2): via INTRAVENOUS

## 2013-08-18 MED ORDER — HYDROMORPHONE HCL PF 1 MG/ML IJ SOLN
1.0000 mg | INTRAMUSCULAR | Status: DC | PRN
  Administered 2013-08-18: 0.5 mg via INTRAVENOUS
  Administered 2013-08-20: 1 mg via INTRAVENOUS
  Filled 2013-08-18 (×2): qty 1

## 2013-08-18 MED ORDER — THROMBIN 5000 UNITS EX SOLR
CUTANEOUS | Status: DC | PRN
  Administered 2013-08-18: 10000 [IU] via TOPICAL

## 2013-08-18 MED ORDER — ACETAMINOPHEN 325 MG PO TABS: 650.0000 mg | ORAL_TABLET | Freq: Four times a day (QID) | ORAL | Status: DC | PRN

## 2013-08-18 MED ORDER — GLYCOPYRROLATE 0.2 MG/ML IJ SOLN
INTRAMUSCULAR | Status: DC | PRN
  Administered 2013-08-18 (×2): .2 mg via INTRAVENOUS

## 2013-08-18 MED ORDER — ESMOLOL HCL 10 MG/ML IV SOLN
INTRAVENOUS | Status: DC | PRN
  Administered 2013-08-18: 20 mg via INTRAVENOUS

## 2013-08-18 MED ORDER — LACTATED RINGERS IV SOLN
INTRAVENOUS | Status: DC
  Administered 2013-08-18 (×3): via INTRAVENOUS

## 2013-08-18 MED ORDER — DEXAMETHASONE SODIUM PHOSPHATE 10 MG/ML IJ SOLN
INTRAMUSCULAR | Status: AC
  Filled 2013-08-18: qty 1

## 2013-08-18 MED ORDER — HYDROMORPHONE HCL PF 2 MG/ML IJ SOLN
INTRAMUSCULAR | Status: AC
Start: 2013-08-18 — End: 2013-08-18
  Filled 2013-08-18: qty 1

## 2013-08-18 MED ORDER — CISATRACURIUM BESYLATE (PF) 10 MG/5ML IV SOLN
INTRAVENOUS | Status: DC | PRN
  Administered 2013-08-18: 6 mg via INTRAVENOUS

## 2013-08-18 MED ORDER — FENTANYL CITRATE 0.05 MG/ML IJ SOLN
INTRAMUSCULAR | Status: AC
  Filled 2013-08-18: qty 5

## 2013-08-18 MED ORDER — MENTHOL 3 MG MT LOZG
1.0000 | LOZENGE | OROMUCOSAL | Status: DC | PRN
  Filled 2013-08-18: qty 9

## 2013-08-18 MED ORDER — HYDROCODONE-ACETAMINOPHEN 5-325 MG PO TABS
1.0000 | ORAL_TABLET | ORAL | Status: DC | PRN
  Administered 2013-08-20 (×2): 2 via ORAL
  Filled 2013-08-18 (×3): qty 2

## 2013-08-18 MED ORDER — THROMBIN 5000 UNITS EX SOLR
CUTANEOUS | Status: AC
  Filled 2013-08-18: qty 10000

## 2013-08-18 MED ORDER — ACETAMINOPHEN 650 MG RE SUPP: 650.0000 mg | Freq: Four times a day (QID) | RECTAL | Status: DC | PRN

## 2013-08-18 MED ORDER — FERROUS SULFATE 325 (65 FE) MG PO TABS
325.0000 mg | ORAL_TABLET | Freq: Three times a day (TID) | ORAL | Status: DC
  Administered 2013-08-18 – 2013-08-21 (×8): 325 mg via ORAL
  Filled 2013-08-18 (×11): qty 1

## 2013-08-18 MED ORDER — SUMATRIPTAN SUCCINATE 100 MG PO TABS
100.0000 mg | ORAL_TABLET | ORAL | Status: DC | PRN
  Administered 2013-08-18 – 2013-08-20 (×3): 100 mg via ORAL
  Filled 2013-08-18 (×3): qty 1

## 2013-08-18 MED ORDER — HYDROMORPHONE HCL PF 1 MG/ML IJ SOLN
INTRAMUSCULAR | Status: AC
  Filled 2013-08-18: qty 1

## 2013-08-18 MED ORDER — ACETAMINOPHEN 10 MG/ML IV SOLN
1000.0000 mg | Freq: Once | INTRAVENOUS | Status: AC
  Administered 2013-08-18: 1000 mg via INTRAVENOUS
  Filled 2013-08-18: qty 100

## 2013-08-18 MED ORDER — CELECOXIB 200 MG PO CAPS
200.0000 mg | ORAL_CAPSULE | Freq: Two times a day (BID) | ORAL | Status: DC
  Administered 2013-08-18 – 2013-08-21 (×7): 200 mg via ORAL
  Filled 2013-08-18 (×8): qty 1

## 2013-08-18 MED ORDER — ONDANSETRON HCL 4 MG/2ML IJ SOLN: 4.0000 mg | Freq: Four times a day (QID) | INTRAMUSCULAR | Status: DC | PRN

## 2013-08-18 MED ORDER — DULOXETINE HCL 60 MG PO CPEP
60.0000 mg | ORAL_CAPSULE | Freq: Every evening | ORAL | Status: DC
  Administered 2013-08-18 – 2013-08-20 (×3): 60 mg via ORAL
  Filled 2013-08-18 (×4): qty 1

## 2013-08-18 MED ORDER — ONDANSETRON HCL 4 MG/2ML IJ SOLN
INTRAMUSCULAR | Status: DC | PRN
  Administered 2013-08-18: 2 mg via INTRAVENOUS

## 2013-08-18 MED ORDER — SODIUM CHLORIDE 0.9 % IJ SOLN
INTRAMUSCULAR | Status: AC
  Filled 2013-08-18: qty 10

## 2013-08-18 MED ORDER — BUPIVACAINE LIPOSOME 1.3 % IJ SUSP
20.0000 mL | Freq: Once | INTRAMUSCULAR | Status: AC
  Administered 2013-08-18: 20 mL
  Filled 2013-08-18: qty 20

## 2013-08-18 MED ORDER — RIVAROXABAN 10 MG PO TABS
10.0000 mg | ORAL_TABLET | Freq: Every day | ORAL | Status: DC
  Administered 2013-08-19 – 2013-08-21 (×3): 10 mg via ORAL
  Filled 2013-08-18 (×4): qty 1

## 2013-08-18 MED ORDER — PROPOFOL 10 MG/ML IV BOLUS
INTRAVENOUS | Status: AC
Start: 2013-08-18 — End: 2013-08-18
  Filled 2013-08-18: qty 20

## 2013-08-18 MED ORDER — PHENYLEPHRINE HCL 10 MG/ML IJ SOLN
10.0000 mg | INTRAVENOUS | Status: DC | PRN
  Administered 2013-08-18: 10 ug/min via INTRAVENOUS

## 2013-08-18 MED ORDER — SUCCINYLCHOLINE CHLORIDE 20 MG/ML IJ SOLN
INTRAMUSCULAR | Status: DC | PRN
  Administered 2013-08-18: 140 mg via INTRAVENOUS

## 2013-08-18 MED ORDER — LIDOCAINE HCL (CARDIAC) 20 MG/ML IV SOLN
INTRAVENOUS | Status: DC | PRN
  Administered 2013-08-18: 75 mg via INTRAVENOUS

## 2013-08-18 MED ORDER — LIDOCAINE HCL (CARDIAC) 20 MG/ML IV SOLN
INTRAVENOUS | Status: AC
  Filled 2013-08-18: qty 5

## 2013-08-18 MED ORDER — CISATRACURIUM BESYLATE 20 MG/10ML IV SOLN
INTRAVENOUS | Status: AC
  Filled 2013-08-18: qty 10

## 2013-08-18 MED ORDER — POLYETHYLENE GLYCOL 3350 17 G PO PACK
17.0000 g | PACK | Freq: Every day | ORAL | Status: DC | PRN
  Administered 2013-08-20: 17 g via ORAL

## 2013-08-18 MED ORDER — PHENYLEPHRINE HCL 10 MG/ML IJ SOLN
INTRAMUSCULAR | Status: AC
  Filled 2013-08-18: qty 1

## 2013-08-18 MED ORDER — HYDROCHLOROTHIAZIDE 25 MG PO TABS
25.0000 mg | ORAL_TABLET | Freq: Every morning | ORAL | Status: DC
  Administered 2013-08-18 – 2013-08-21 (×4): 25 mg via ORAL
  Filled 2013-08-18 (×4): qty 1

## 2013-08-18 MED ORDER — METHOCARBAMOL 500 MG PO TABS
500.0000 mg | ORAL_TABLET | Freq: Four times a day (QID) | ORAL | Status: DC | PRN
  Administered 2013-08-19: 500 mg via ORAL
  Filled 2013-08-18: qty 1

## 2013-08-18 MED ORDER — CEFAZOLIN SODIUM-DEXTROSE 2-3 GM-% IV SOLR
2.0000 g | INTRAVENOUS | Status: AC
  Administered 2013-08-18: 2 g via INTRAVENOUS

## 2013-08-18 MED ORDER — PROPOFOL 10 MG/ML IV BOLUS
INTRAVENOUS | Status: DC | PRN
  Administered 2013-08-18: 175 mg via INTRAVENOUS

## 2013-08-18 MED ORDER — ONDANSETRON HCL 4 MG PO TABS: 4.0000 mg | ORAL_TABLET | Freq: Four times a day (QID) | ORAL | Status: DC | PRN

## 2013-08-18 MED ORDER — SODIUM CHLORIDE 0.9 % IJ SOLN
INTRAMUSCULAR | Status: AC
  Filled 2013-08-18: qty 50

## 2013-08-18 MED ORDER — SUFENTANIL CITRATE 50 MCG/ML IV SOLN
INTRAVENOUS | Status: AC
  Filled 2013-08-18: qty 1

## 2013-08-18 MED ORDER — NEOSTIGMINE METHYLSULFATE 10 MG/10ML IV SOLN
INTRAVENOUS | Status: DC | PRN
  Administered 2013-08-18: 1 mg via INTRAVENOUS

## 2013-08-18 MED ORDER — SODIUM CHLORIDE 0.9 % IR SOLN
Status: DC | PRN
  Administered 2013-08-18: 10:00:00

## 2013-08-18 MED ORDER — PHENYLEPHRINE HCL 10 MG/ML IJ SOLN
INTRAMUSCULAR | Status: DC | PRN
  Administered 2013-08-18: 40 ug via INTRAVENOUS

## 2013-08-18 MED ORDER — OXYCODONE-ACETAMINOPHEN 5-325 MG PO TABS
2.0000 | ORAL_TABLET | ORAL | Status: DC | PRN
  Administered 2013-08-18 (×2): 2 via ORAL
  Administered 2013-08-18 – 2013-08-19 (×2): 1 via ORAL
  Administered 2013-08-19: 2 via ORAL
  Administered 2013-08-19: 1 via ORAL
  Administered 2013-08-19 (×4): 2 via ORAL
  Filled 2013-08-18 (×10): qty 2

## 2013-08-18 MED ORDER — ALUM & MAG HYDROXIDE-SIMETH 200-200-20 MG/5ML PO SUSP
30.0000 mL | ORAL | Status: DC | PRN
  Filled 2013-08-18: qty 30

## 2013-08-18 MED ORDER — PROMETHAZINE HCL 25 MG/ML IJ SOLN: 6.2500 mg | INTRAMUSCULAR | Status: DC | PRN

## 2013-08-18 MED ORDER — PHENOL 1.4 % MT LIQD
1.0000 | OROMUCOSAL | Status: DC | PRN
  Filled 2013-08-18: qty 177

## 2013-08-18 MED ORDER — SUFENTANIL CITRATE 50 MCG/ML IV SOLN
INTRAVENOUS | Status: DC | PRN
  Administered 2013-08-18: 10 ug via INTRAVENOUS
  Administered 2013-08-18: 40 ug via INTRAVENOUS
  Administered 2013-08-18 (×5): 10 ug via INTRAVENOUS

## 2013-08-18 MED ORDER — MIDAZOLAM HCL 2 MG/2ML IJ SOLN
INTRAMUSCULAR | Status: AC
  Filled 2013-08-18: qty 2

## 2013-08-18 MED ORDER — DEXAMETHASONE SODIUM PHOSPHATE 10 MG/ML IJ SOLN
INTRAMUSCULAR | Status: DC | PRN
  Administered 2013-08-18: 10 mg via INTRAVENOUS

## 2013-08-18 MED ORDER — SODIUM CHLORIDE 0.9 % IJ SOLN
INTRAMUSCULAR | Status: DC | PRN
  Administered 2013-08-18: 20 mL

## 2013-08-18 MED ORDER — HYDROMORPHONE HCL PF 1 MG/ML IJ SOLN
0.2500 mg | INTRAMUSCULAR | Status: DC | PRN
  Administered 2013-08-18 (×2): 0.25 mg via INTRAVENOUS

## 2013-08-18 MED ORDER — BISACODYL 5 MG PO TBEC: 5.0000 mg | DELAYED_RELEASE_TABLET | Freq: Every day | ORAL | Status: DC | PRN

## 2013-08-18 SURGICAL SUPPLY — 74 items
ADH SKN CLS APL DERMABOND .7 (GAUZE/BANDAGES/DRESSINGS)
BAG SPEC THK2 15X12 ZIP CLS (MISCELLANEOUS)
BAG ZIPLOCK 12X15 (MISCELLANEOUS) IMPLANT
BANDAGE ELASTIC 4 VELCRO ST LF (GAUZE/BANDAGES/DRESSINGS) ×2 IMPLANT
BANDAGE ELASTIC 6 VELCRO ST LF (GAUZE/BANDAGES/DRESSINGS) ×2 IMPLANT
BANDAGE ESMARK 6X9 LF (GAUZE/BANDAGES/DRESSINGS) ×1 IMPLANT
BLADE SAG 18X100X1.27 (BLADE) ×2 IMPLANT
BLADE SAW SGTL 11.0X1.19X90.0M (BLADE) ×2 IMPLANT
BNDG CMPR 9X6 STRL LF SNTH (GAUZE/BANDAGES/DRESSINGS) ×1
BNDG ESMARK 6X9 LF (GAUZE/BANDAGES/DRESSINGS) ×2
BONE CEMENT GENTAMICIN (Cement) ×4 IMPLANT
CAP UPCHARGE REVISION TRAY ×1 IMPLANT
CAPT RP KNEE ×1 IMPLANT
CEMENT BONE GENTAMICIN 40 (Cement) ×2 IMPLANT
CUFF TOURN SGL QUICK 34 (TOURNIQUET CUFF) ×2
CUFF TRNQT CYL 34X4X40X1 (TOURNIQUET CUFF) ×1 IMPLANT
DERMABOND ADVANCED (GAUZE/BANDAGES/DRESSINGS)
DERMABOND ADVANCED .7 DNX12 (GAUZE/BANDAGES/DRESSINGS) IMPLANT
DRAPE EXTREMITY T 121X128X90 (DRAPE) ×2 IMPLANT
DRAPE INCISE IOBAN 66X45 STRL (DRAPES) ×2 IMPLANT
DRAPE LG THREE QUARTER DISP (DRAPES) ×2 IMPLANT
DRAPE POUCH INSTRU U-SHP 10X18 (DRAPES) ×2 IMPLANT
DRAPE U-SHAPE 47X51 STRL (DRAPES) ×2 IMPLANT
DRSG ADAPTIC 3X8 NADH LF (GAUZE/BANDAGES/DRESSINGS) ×1 IMPLANT
DRSG AQUACEL AG ADV 3.5X10 (GAUZE/BANDAGES/DRESSINGS) ×1 IMPLANT
DRSG AQUACEL AG ADV 3.5X14 (GAUZE/BANDAGES/DRESSINGS) ×1 IMPLANT
DRSG PAD ABDOMINAL 8X10 ST (GAUZE/BANDAGES/DRESSINGS) IMPLANT
DRSG TEGADERM 4X4.75 (GAUZE/BANDAGES/DRESSINGS) ×1 IMPLANT
DURAPREP 26ML APPLICATOR (WOUND CARE) ×2 IMPLANT
ELECT REM PT RETURN 9FT ADLT (ELECTROSURGICAL) ×2
ELECTRODE REM PT RTRN 9FT ADLT (ELECTROSURGICAL) ×1 IMPLANT
EVACUATOR 1/8 PVC DRAIN (DRAIN) ×2 IMPLANT
FACESHIELD WRAPAROUND (MASK) ×10 IMPLANT
FACESHIELD WRAPAROUND OR TEAM (MASK) ×5 IMPLANT
GAUZE SPONGE 2X2 8PLY STRL LF (GAUZE/BANDAGES/DRESSINGS) IMPLANT
GLOVE BIOGEL PI IND STRL 6.5 (GLOVE) ×1 IMPLANT
GLOVE BIOGEL PI IND STRL 8 (GLOVE) ×1 IMPLANT
GLOVE BIOGEL PI INDICATOR 6.5 (GLOVE) ×1
GLOVE BIOGEL PI INDICATOR 8 (GLOVE) ×1
GLOVE ECLIPSE 8.0 STRL XLNG CF (GLOVE) ×4 IMPLANT
GLOVE SURG SS PI 6.5 STRL IVOR (GLOVE) ×2 IMPLANT
GOWN STRL REUS W/TWL LRG LVL3 (GOWN DISPOSABLE) ×2 IMPLANT
GOWN STRL REUS W/TWL XL LVL3 (GOWN DISPOSABLE) ×2 IMPLANT
HANDPIECE INTERPULSE COAX TIP (DISPOSABLE) ×2
IMMOBILIZER KNEE 20 (SOFTGOODS) ×3 IMPLANT
IMMOBILIZER KNEE 20 THIGH 36 (SOFTGOODS) ×1 IMPLANT
KIT BASIN OR (CUSTOM PROCEDURE TRAY) ×2 IMPLANT
MANIFOLD NEPTUNE II (INSTRUMENTS) ×2 IMPLANT
NEEDLE HYPO 22GX1.5 SAFETY (NEEDLE) ×2 IMPLANT
NS IRRIG 1000ML POUR BTL (IV SOLUTION) IMPLANT
PACK TOTAL JOINT (CUSTOM PROCEDURE TRAY) ×2 IMPLANT
PADDING CAST COTTON 6X4 STRL (CAST SUPPLIES) IMPLANT
POSITIONER SURGICAL ARM (MISCELLANEOUS) ×2 IMPLANT
SET HNDPC FAN SPRY TIP SCT (DISPOSABLE) ×1 IMPLANT
SET PAD KNEE POSITIONER (MISCELLANEOUS) ×2 IMPLANT
SPONGE GAUZE 2X2 STER 10/PKG (GAUZE/BANDAGES/DRESSINGS) ×1
SPONGE LAP 18X18 X RAY DECT (DISPOSABLE) IMPLANT
SPONGE SURGIFOAM ABS GEL 100 (HEMOSTASIS) ×2 IMPLANT
STAPLER VISISTAT 35W (STAPLE) IMPLANT
SUCTION FRAZIER 12FR DISP (SUCTIONS) ×2 IMPLANT
SUT BONE WAX W31G (SUTURE) ×2 IMPLANT
SUT MNCRL AB 4-0 PS2 18 (SUTURE) ×2 IMPLANT
SUT VIC AB 1 CT1 27 (SUTURE) ×4
SUT VIC AB 1 CT1 27XBRD ANTBC (SUTURE) ×2 IMPLANT
SUT VIC AB 2-0 CT1 27 (SUTURE) ×8
SUT VIC AB 2-0 CT1 TAPERPNT 27 (SUTURE) ×3 IMPLANT
SUT VLOC 180 0 24IN GS25 (SUTURE) ×2 IMPLANT
SYRINGE 20CC LL (MISCELLANEOUS) ×2 IMPLANT
TOWEL OR 17X26 10 PK STRL BLUE (TOWEL DISPOSABLE) ×2 IMPLANT
TOWEL OR NON WOVEN STRL DISP B (DISPOSABLE) IMPLANT
TOWER CARTRIDGE SMART MIX (DISPOSABLE) ×2 IMPLANT
TRAY FOLEY CATH 14FRSI W/METER (CATHETERS) ×2 IMPLANT
WATER STERILE IRR 1500ML POUR (IV SOLUTION) ×2 IMPLANT
WRAP KNEE MAXI GEL POST OP (GAUZE/BANDAGES/DRESSINGS) ×2 IMPLANT

## 2013-08-18 NOTE — Plan of Care (Signed)
Problem: Consults Goal: Diagnosis- Total Joint Replacement Right total knee     

## 2013-08-18 NOTE — Interval H&P Note (Signed)
History and Physical Interval Note:  08/18/2013 9:27 AM  Kelsey Simmons  has presented today for surgery, with the diagnosis of right knee osteoarthritis  The various methods of treatment have been discussed with the patient and family. After consideration of risks, benefits and other options for treatment, the patient has consented to  Procedure(s): RIGHT TOTAL KNEE ARTHROPLASTY (Right) as a surgical intervention .  The patient's history has been reviewed, patient examined, no change in status, stable for surgery.  I have reviewed the patient's chart and labs.  Questions were answered to the patient's satisfaction.     Kosta Schnitzler A

## 2013-08-18 NOTE — Anesthesia Preprocedure Evaluation (Addendum)
Anesthesia Evaluation  Patient identified by MRN, date of birth, ID band Patient awake    Reviewed: Allergy & Precautions, H&P , NPO status , Patient's Chart, lab work & pertinent test results  Airway Mallampati: II TM Distance: <3 FB Neck ROM: Full    Dental no notable dental hx.    Pulmonary sleep apnea ,  breath sounds clear to auscultation  Pulmonary exam normal       Cardiovascular hypertension, Pt. on medications Rhythm:Regular Rate:Normal     Neuro/Psych negative neurological ROS  negative psych ROS   GI/Hepatic negative GI ROS, Neg liver ROS,   Endo/Other  Morbid obesity  Renal/GU negative Renal ROS  negative genitourinary   Musculoskeletal  (+) Fibromyalgia -  Abdominal   Peds negative pediatric ROS (+)  Hematology negative hematology ROS (+)   Anesthesia Other Findings   Reproductive/Obstetrics negative OB ROS                         Anesthesia Physical Anesthesia Plan  ASA: III  Anesthesia Plan: General   Post-op Pain Management:    Induction: Intravenous  Airway Management Planned: Oral ETT  Additional Equipment:   Intra-op Plan:   Post-operative Plan: Extubation in OR  Informed Consent: I have reviewed the patients History and Physical, chart, labs and discussed the procedure including the risks, benefits and alternatives for the proposed anesthesia with the patient or authorized representative who has indicated his/her understanding and acceptance.   Dental advisory given  Plan Discussed with: CRNA and Surgeon  Anesthesia Plan Comments:         Anesthesia Quick Evaluation

## 2013-08-18 NOTE — Progress Notes (Signed)
PT Cancellation Note  Patient Details Name: Kelsey LemonsDorothy R Simmons MRN: [id number] DOB: 29-Aug-1960   Cancelled Treatment:     POD 0 eval deferred - pt with elevated HR.  Will follow in am   Ebony Rickel 08/18/2013, 4:48 PM

## 2013-08-18 NOTE — Brief Op Note (Signed)
08/18/2013  11:28 AM  PATIENT:  Kelsey LemonsDorothy R Simmons  53 y.o. female  PRE-OPERATIVE DIAGNOSIS:  right knee osteoarthritis with Contracture and Morbid Obesity  POST-OPERATIVE DIAGNOSIS:  right knee osteoarthritis with Contracture and Morbid Obesity.  PROCEDURE:  Procedure(s): RIGHT TOTAL KNEE ARTHROPLASTY (Right) and release of Contracture.  SURGEON:  Surgeon(s) and Role:    * Jacki Conesonald A Murielle Stang, MD - Primary  PHYSICIAN ASSISTANT: Dimitri PedAmber Constable PA  ASSISTANTS: Dimitri PedAmber Constable PA  ANESTHESIA:   general  EBL:  Total I/O In: 1000 [I.V.:1000] Out: -   BLOOD ADMINISTERED:none  DRAINS: (one) Hemovact drain(s) in the Right Knee with  Suction Open   LOCAL MEDICATIONS USED:  BUPIVICAINE 20cc mixed with 20cc of Normal Saline  SPECIMEN:  No Specimen  DISPOSITION OF SPECIMEN:  N/A  COUNTS:  YES  TOURNIQUET:  * Missing tourniquet times found for documented tourniquets in log:  [id number] *  DICTATION: .Other Dictation: Dictation Number [id number]  PLAN OF CARE: Admit to inpatient   PATIENT DISPOSITION:  Stable in OR   Delay start of Pharmacological VTE agent (>24hrs) due to surgical blood loss or risk of bleeding: yes

## 2013-08-18 NOTE — Anesthesia Postprocedure Evaluation (Signed)
  Anesthesia Post-op Note  Patient: Kelsey Simmons  Procedure(s) Performed: Procedure(s) (LRB): RIGHT TOTAL KNEE ARTHROPLASTY (Right)  Patient Location: PACU  Anesthesia Type: General  Level of Consciousness: awake and alert   Airway and Oxygen Therapy: Patient Spontanous Breathing  Post-op Pain: mild  Post-op Assessment: Post-op Vital signs reviewed, Patient's Cardiovascular Status Stable, Respiratory Function Stable, Patent Airway and No signs of Nausea or vomiting  Last Vitals:  Filed Vitals:   08/18/13 1216  BP: 145/78  Pulse: 107  Temp: 36.8 C  Resp: 20    Post-op Vital Signs: stable   Complications: No apparent anesthesia complications

## 2013-08-18 NOTE — Anesthesia Procedure Notes (Signed)
Procedure Name: Intubation Date/Time: 08/18/2013 9:44 AM Performed by: Edison PaceGRAY, Seyon Strader E Pre-anesthesia Checklist: Patient identified, Timeout performed, Emergency Drugs available, Suction available and Patient being monitored Patient Re-evaluated:Patient Re-evaluated prior to inductionOxygen Delivery Method: Circle system utilized Preoxygenation: Pre-oxygenation with 100% oxygen Intubation Type: IV induction Ventilation: Mask ventilation without difficulty Laryngoscope Size: Mac and 4 Grade View: Grade II Tube type: Oral Tube size: 7.5 mm Number of attempts: 1 Airway Equipment and Method: Stylet Placement Confirmation: ETT inserted through vocal cords under direct vision,  breath sounds checked- equal and bilateral and positive ETCO2 Secured at: 21 cm Tube secured with: Tape Dental Injury: Teeth and Oropharynx as per pre-operative assessment

## 2013-08-18 NOTE — Transfer of Care (Signed)
Immediate Anesthesia Transfer of Care Note  Patient: Kelsey LemonsDorothy R Simmons  Procedure(s) Performed: Procedure(s): RIGHT TOTAL KNEE ARTHROPLASTY (Right)  Patient Location: PACU  Anesthesia Type:General  Level of Consciousness: awake, alert , oriented, patient cooperative and responds to stimulation  Airway & Oxygen Therapy: Patient Spontanous Breathing and Patient connected to face mask oxygen  Post-op Assessment: Report given to PACU RN, Post -op Vital signs reviewed and stable and Patient moving all extremities  Post vital signs: Reviewed and stable  Complications: No apparent anesthesia complications

## 2013-08-19 LAB — CBC
HEMATOCRIT: 35.8 % — AB (ref 36.0–46.0)
HEMOGLOBIN: 11.7 g/dL — AB (ref 12.0–15.0)
MCH: 27.9 pg (ref 26.0–34.0)
MCHC: 32.7 g/dL (ref 30.0–36.0)
MCV: 85.4 fL (ref 78.0–100.0)
Platelets: 255 10*3/uL (ref 150–400)
RBC: 4.19 MIL/uL (ref 3.87–5.11)
RDW: 15.4 % (ref 11.5–15.5)
WBC: 16 10*3/uL — ABNORMAL HIGH (ref 4.0–10.5)

## 2013-08-19 LAB — BASIC METABOLIC PANEL
Anion gap: 11 (ref 5–15)
BUN: 11 mg/dL (ref 6–23)
CALCIUM: 9.5 mg/dL (ref 8.4–10.5)
CO2: 27 mEq/L (ref 19–32)
Chloride: 96 mEq/L (ref 96–112)
Creatinine, Ser: 0.76 mg/dL (ref 0.50–1.10)
GFR calc non Af Amer: >90 mL/min (ref >90)
GLUCOSE: 143 mg/dL — AB (ref 70–99)
POTASSIUM: 4.5 meq/L (ref 3.7–5.3)
Sodium: 134 mEq/L — ABNORMAL LOW (ref 137–147)

## 2013-08-19 NOTE — Progress Notes (Addendum)
[facility] the [facility]: RW and commode  If patient discharges after hours, please call [phone].   Kelsey HamperLecretia Williamson 08/19/2013, 4:05 PM

## 2013-08-19 NOTE — Evaluation (Signed)
Physical Therapy Evaluation Patient Details Name: Kelsey Simmons MRN: [id number] DOB: 1960/06/03 Today's Date: 08/19/2013   History of Present Illness     Clinical Impression  Pt s/p R TKR presents with decreased R LE strength/ROM and post op pain limiting functional mobility.  Pt should progress well to d/c home with family assist and follow up HHPT    Follow Up Recommendations Home health PT    Equipment Recommendations  None recommended by PT    Recommendations for Other Services OT consult     Precautions / Restrictions Precautions Precautions: Knee;Fall Required Braces or Orthoses: Knee Immobilizer - Right Knee Immobilizer - Right: Discontinue once straight leg raise with < 10 degree lag Restrictions Weight Bearing Restrictions: No Other Position/Activity Restrictions: WBAT      Mobility  Bed Mobility Overal bed mobility: Needs Assistance Bed Mobility: Supine to Sit     Supine to sit: Mod assist     General bed mobility comments: cues for sequence and use of L LE to self assist; Pt utilizing bed rail to bring trunk to upright  Transfers Overall transfer level: Needs assistance Equipment used: Rolling walker (2 wheeled) Transfers: Sit to/from Stand Sit to Stand: Mod assist         General transfer comment: cues for LE management and use of UEs to self assist  Ambulation/Gait Ambulation/Gait assistance: Min assist;Mod assist Ambulation Distance (Feet): 29 Feet Assistive device: Rolling walker (2 wheeled) Gait Pattern/deviations: Step-to pattern;Decreased step length - right;Decreased step length - left;Shuffle;Trunk flexed Gait velocity: decr   General Gait Details: Cues for posture, sequence, position from [employer]            Wheelchair Mobility    Modified Rankin (Stroke Patients Only)       Balance                                             Pertinent Vitals/Pain 5/10; premed,ice packs provided    Home  Living Family/patient expects to be discharged to:: Private residence Living Arrangements: Spouse/significant other Available Help at Discharge: Family Type of Home: House Home Access: Stairs to enter Entrance Stairs-Rails: None Entrance Stairs-Number of Steps: 1 Home Layout: Able to live on main level with bedroom/bathroom Home Equipment: Walker - 2 wheels;Walker - 4 wheels      Prior Function Level of Independence: Independent with assistive device(s);Needs assistance         Comments: Husband has been doing majority of cooking and cleaning     Hand Dominance        Extremity/Trunk Assessment   Upper Extremity Assessment: Overall WFL for tasks assessed           Lower Extremity Assessment: RLE deficits/detail RLE Deficits / Details: 2+/5 quads with AAROM at knee -10 - 45    Cervical / Trunk Assessment: Normal  Communication   Communication: No difficulties  Cognition Arousal/Alertness: Awake/alert Behavior During Therapy: WFL for tasks assessed/performed Overall Cognitive Status: Within Functional Limits for tasks assessed                      General Comments      Exercises Total Joint Exercises Ankle Circles/Pumps: AROM;Both;15 reps;Supine Quad Sets: AROM;Both;10 reps;Supine Heel Slides: AAROM;15 reps;Supine;Right Straight Leg Raises: AAROM;Right;10 reps;Supine      Assessment/Plan    PT Assessment Patient needs continued PT  services  PT Diagnosis Difficulty walking   PT Problem List Decreased strength;Decreased range of motion;Decreased activity tolerance;Decreased mobility;Decreased knowledge of use of DME;Obesity;Pain;Decreased knowledge of precautions  PT Treatment Interventions DME instruction;Gait training;Stair training;Functional mobility training;Therapeutic activities;Therapeutic exercise;Patient/family education   PT Goals (Current goals can be found in the Care Plan section) Acute Rehab PT Goals Patient Stated Goal: Resume  previous lifestyle with decreased pain PT Goal Formulation: With patient Time For Goal Achievement: 08/27/13 Potential to Achieve Goals: Good    Frequency 7X/week   Barriers to discharge        Co-evaluation               End of Session Equipment Utilized During Treatment: Gait belt;Right knee immobilizer Activity Tolerance: Patient tolerated treatment well Patient left: in chair;with call bell/phone within reach Nurse Communication: Mobility status         Time: 1110-1145 PT Time Calculation (min): 35 min   Charges:   PT Evaluation $Initial PT Evaluation Tier I: 1 Procedure PT Treatments $Gait Training: 8-22 mins $Therapeutic Exercise: 8-22 mins   PT G Codes:          Lariza Cothron 08/19/2013, 1:02 PM

## 2013-08-19 NOTE — Progress Notes (Signed)
Subjective: 1 Day Post-Op Procedure(s) (LRB): RIGHT TOTAL KNEE ARTHROPLASTY (Right) Patient reports pain as 3 on 0-10 scale. Doing well. Hemovac DCd.   Objective: Vital signs in last 24 hours: Temp:  [97.6 F (36.4 C)-98.3 F (36.8 C)] 97.9 F (36.6 C) (07/30 0559) Pulse Rate:  [74-124] 78 (07/30 0559) Resp:  [16-20] 16 (07/30 0559) BP: (124-156)/(59-96) 129/88 mmHg (07/30 0559) SpO2:  [96 %-100 %] 96 % (07/30 0559) Weight:  [113.399 kg (250 lb)] 113.399 kg (250 lb) (07/29 1330)  Intake/Output from previous day: 07/29 0701 - 07/30 0700 In: 3741.7 [P.O.:480; I.V.:3211.7; IV Piggyback:50] Out: 4225 [Urine:3760; Drains:465] Intake/Output this shift:     Recent Labs  08/19/13 0453  HGB 11.7*    Recent Labs  08/19/13 0453  WBC 16.0*  RBC 4.19  HCT 35.8*  PLT 255    Recent Labs  08/19/13 0453  NA 134*  K 4.5  CL 96  CO2 27  BUN 11  CREATININE 0.76  GLUCOSE 143*  CALCIUM 9.5   No results found for this basename: LABPT, INR,  in the last 72 hours  Neurologically intact  Assessment/Plan: 1 Day Post-Op Procedure(s) (LRB): RIGHT TOTAL KNEE ARTHROPLASTY (Right) Up with therapy  Jakalyn Kratky A 08/19/2013, 7:13 AM

## 2013-08-19 NOTE — Progress Notes (Signed)
OT Cancellation Note  Patient Details Name: Kelsey Simmons MRN: [id number] DOB: December 10, 1960   Cancelled Treatment:    Reason Eval/Treat Not Completed: Other (comment)   Pt just finished with PT and got queasy.  Will check back later today if schedule permits.    Chaim Gatley 08/19/2013, 12:01 PM Marica OtterMaryellen Oluwanifemi Susman, OTR/L [phone] 08/19/2013

## 2013-08-19 NOTE — Progress Notes (Signed)
Physical Therapy Treatment Patient Details Name: Kelsey LemonsDorothy R Simmons MRN: [id number] DOB: 04-11-1960 Today's Date: 08/19/2013    History of Present Illness      PT Comments    Progressing well and very motivated  Follow Up Recommendations  Home health PT     Equipment Recommendations  Rolling walker with 5" wheels (wide RW)    Recommendations for Other Services OT consult     Precautions / Restrictions Precautions Precautions: Knee;Fall Required Braces or Orthoses: Knee Immobilizer - Right Knee Immobilizer - Right: Discontinue once straight leg raise with < 10 degree lag Restrictions Weight Bearing Restrictions: No Other Position/Activity Restrictions: WBAT    Mobility  Bed Mobility Overal bed mobility: Needs Assistance Bed Mobility: Sit to Supine       Sit to supine: Min assist;Mod assist   General bed mobility comments: cues for sequence and use of L LE to self assist; Pt utilizing bed rail to bring trunk to upright  Transfers Overall transfer level: Needs assistance Equipment used: Rolling walker (2 wheeled) Transfers: Sit to/from Stand Sit to Stand: Min assist;Mod assist         General transfer comment: cues for LE management and use of UEs to self assist  Ambulation/Gait Ambulation/Gait assistance: Min assist Ambulation Distance (Feet): 65 Feet (and 5' from chair to bed) Assistive device: Rolling walker (2 wheeled) Gait Pattern/deviations: Step-to pattern;Decreased step length - right;Decreased step length - left;Shuffle;Trunk flexed Gait velocity: decr   General Gait Details: Cues for posture, sequence, position from Rohm [employer] HaasW   Stairs            Wheelchair Mobility    Modified Rankin (Stroke Patients Only)       Balance                                    Cognition Arousal/Alertness: Awake/alert Behavior During Therapy: WFL for tasks assessed/performed Overall Cognitive Status: Within Functional Limits for tasks assessed                       Exercises      General Comments        Pertinent Vitals/Pain 5/10; premed, ice packs provided    Home Living                      Prior Function            PT Goals (current goals can now be found in the care plan section) Acute Rehab PT Goals Patient Stated Goal: Resume previous lifestyle with decreased pain PT Goal Formulation: With patient Time For Goal Achievement: 08/27/13 Potential to Achieve Goals: Good Progress towards PT goals: Progressing toward goals    Frequency  7X/week    PT Plan Current plan remains appropriate    Co-evaluation             End of Session Equipment Utilized During Treatment: Gait belt;Right knee immobilizer Activity Tolerance: Patient tolerated treatment well Patient left: in bed;with call bell/phone within reach;with family/visitor present     Time: [id number] PT Time Calculation (min): 26 min  Charges:  $Gait Training: 23-37 mins                    G Codes:      Keiandra Sullenger 08/19/2013, 3:31 PM

## 2013-08-19 NOTE — Op Note (Signed)
Kelsey Simmons, Kelsey Simmons NO.:  [id number]  MEDICAL RECORD NO.:  [id number]  LOCATION:  1607                         FACILITY:  [facility]  PHYSICIAN:  Georges Lynch. Kong Packett, M.D.DATE OF BIRTH:  04-29-1960  DATE OF PROCEDURE:  08/18/2013 DATE OF DISCHARGE:                              OPERATIVE REPORT   SURGEON:  Georges Lynch. Darrelyn Hillock, M.D.  ASSISTANT:  Dimitri Ped, [place].  PREOPERATIVE DIAGNOSES: 1. Morbid obesity. 2. 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 3. Flexion contracture, right knee.  POSTOPERATIVE DIAGNOSES: 1. Morbid obesity. 2. 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 3. Flexion contracture, right knee.  OPERATIONS: 1. A right total knee arthroplasty utilizing DePuy system. 2. We did a release of flexion contractures. 3. We did use a revision DePuy tray, the size of the tray was a size 2-     mm tray with the femoral component being the size 2, the insert was     a size 2, 15-mm thickness.  DESCRIPTION OF PROCEDURE:  Under general anesthesia, routine orthopedic prepping and draping of the right lower extremity was carried out. Note, her leg was extremely large.  We did the appropriate time-out first, also marked the appropriate right leg in the holding area.  The leg was exsanguinated and Esmarch tourniquet was elevated to 350 mmHg. The leg was placed in a [facility].  At that time, incision was made over the anterior aspect of the right knee.  Bleeders were identified and cauterized.  Two flaps were created.  I then went down and carried out a median parapatellar incision, reflecting the patella laterally.  Note, her knee was extremely tight and extremely arthritic. We did a synovectomy.  We did medial and lateral meniscectomies and excised the anterior-posterior cruciate ligaments.  At this time, initial drill holes were made in the distal femur.  I removed 14-mm thickness of the distal femur because of  the contracture and her size. My goal was to get a 15-mm thickness insert in.  At this particular time, we then measured the femur to be a size 2.  We did the appropriate femoral cut.  Following that, the attention was paid to the tibial plateau.  We did the appropriate tibial cut for size 2.  We did utilize the intramedullary guide.  We then inserted the lamina spreaders to make sure there were no posterior spurs, there were none.  We had nice releases.  I released the medial corner very nicely as well.  Following that, I went on and then inserted my knee distractors in flexion and extension, and we had excellent stability for a 15-mm thickness. Following that, we then cut our keel cut and our appropriate cut into the tibial canal for the revision stem, we did utilize the revision stem.  I did this because of her body weight.  After that, I then cut my notch cut out of the distal femur.  We went through the various trials and selected a 15-mm thickness insert.  Following that, all trials were removed.  After we did our resurfacing procedure on the patella for a 38- mm patella, three drill holes  were made in the patella.  At this time, we thoroughly water picked out the knee and cemented all three components in simultaneously.  The loose cement then was removed and then water picked the knee out again and then finally inserted the 15-mm thickness size 2 rotating platform insert, reduced the knee and had excellent stability.  We then reattached our medial corner in the usual fashion, and closed the remaining part of the wound over a Hemovac drain.  She had 2 g of IV Ancef.  Sterile dressings were applied.          ______________________________ Georges Lynchonald A. Darrelyn HillockGioffre, M.D.     RAG/MEDQ  D:  08/18/2013  T:  08/18/2013  Job:  [id number]

## 2013-08-19 NOTE — Discharge Instructions (Addendum)
Information on my medicine - XARELTO® (Rivaroxaban) ° °This medication education was reviewed with me or my healthcare representative as part of my discharge preparation.  The pharmacist that spoke with me during my hospital stay was:  Jackson, Rachel E, RPH ° °Why was Xarelto® prescribed for you? °Xarelto® was prescribed for you to reduce the risk of blood clots forming after orthopedic surgery. The medical term for these abnormal blood clots is venous thromboembolism (VTE). ° °What do you need to know about xarelto® ? °Take your Xarelto® ONCE DAILY at the same time every day. °You may take it either with or without food. ° °If you have difficulty swallowing the tablet whole, you may crush it and mix in applesauce just prior to taking your dose. ° °Take Xarelto® exactly as prescribed by your doctor and DO NOT stop taking Xarelto® without talking to the doctor who prescribed the medication.  Stopping without other VTE prevention medication to take the place of Xarelto® may increase your risk of developing a clot. ° °After discharge, you should have regular check-up appointments with your healthcare provider that is prescribing your Xarelto®.   ° °What do you do if you miss a dose? °If you miss a dose, take it as soon as you remember on the same day then continue your regularly scheduled once daily regimen the next day. Do not take two doses of Xarelto® on the same day.  ° °Important Safety Information °A possible side effect of Xarelto® is bleeding. You should call your healthcare provider right away if you experience any of the following: °  Bleeding from an injury or your nose that does not stop. °  Unusual colored urine (red or dark brown) or unusual colored stools (red or black). °  Unusual bruising for unknown reasons. °  A serious fall or if you hit your head (even if there is no bleeding). ° °Some medicines may interact with Xarelto® and might increase your risk of bleeding while on Xarelto®. To help avoid  this, consult your healthcare provider or pharmacist prior to using any new prescription or non-prescription medications, including herbals, vitamins, non-steroidal anti-inflammatory drugs (NSAIDs) and supplements. ° °This website has more information on Xarelto®: www.xarelto.com. ° °Walk with your walker. °Weight bearing as tolerated °Home Health Agency will follow you at home for your therapy °Do not change the dressing over the incision unless there is excess drainage. °Change dressing over drain site as needed.  °Shower only, no tub bath. May start showering once you return home.  °Call if any temperatures greater than 101 or any wound complications: [phone] during the day and ask for Dr. Gioffre's nurse, Tammy Johnson. °

## 2013-08-20 LAB — CBC
HCT: 32.3 % — ABNORMAL LOW (ref 36.0–46.0)
Hemoglobin: 10.4 g/dL — ABNORMAL LOW (ref 12.0–15.0)
MCH: 27.3 pg (ref 26.0–34.0)
MCHC: 32.2 g/dL (ref 30.0–36.0)
MCV: 84.8 fL (ref 78.0–100.0)
PLATELETS: 243 10*3/uL (ref 150–400)
RBC: 3.81 MIL/uL — ABNORMAL LOW (ref 3.87–5.11)
RDW: 15.4 % (ref 11.5–15.5)
WBC: 14 10*3/uL — ABNORMAL HIGH (ref 4.0–10.5)

## 2013-08-20 LAB — BASIC METABOLIC PANEL
ANION GAP: 12 (ref 5–15)
BUN: 20 mg/dL (ref 6–23)
CALCIUM: 9.1 mg/dL (ref 8.4–10.5)
CO2: 27 mEq/L (ref 19–32)
CREATININE: 1.16 mg/dL — AB (ref 0.50–1.10)
Chloride: 93 mEq/L — ABNORMAL LOW (ref 96–112)
GFR, EST AFRICAN AMERICAN: 62 mL/min — AB (ref >90)
GFR, EST NON AFRICAN AMERICAN: 54 mL/min — AB (ref >90)
Glucose, Bld: 119 mg/dL — ABNORMAL HIGH (ref 70–99)
Potassium: 4.2 mEq/L (ref 3.7–5.3)
SODIUM: 132 meq/L — AB (ref 137–147)

## 2013-08-20 MED ORDER — RIVAROXABAN (XARELTO) VTE STARTER PACK (15 & 20 MG): 20.0000 mg | ORAL_TABLET | Freq: Every day | ORAL | Status: DC

## 2013-08-20 MED ORDER — FERROUS SULFATE 325 (65 FE) MG PO TABS: 325.0000 mg | ORAL_TABLET | Freq: Three times a day (TID) | ORAL | Status: DC

## 2013-08-20 MED ORDER — OXYCODONE-ACETAMINOPHEN 5-325 MG PO TABS
1.0000 | ORAL_TABLET | ORAL | Status: DC | PRN
  Administered 2013-08-20 – 2013-08-21 (×5): 2 via ORAL
  Filled 2013-08-20 (×5): qty 2

## 2013-08-20 MED ORDER — OXYCODONE-ACETAMINOPHEN 5-325 MG PO TABS: 1.0000 | ORAL_TABLET | ORAL | Status: DC | PRN

## 2013-08-20 MED ORDER — METHOCARBAMOL 500 MG PO TABS: 500.0000 mg | ORAL_TABLET | Freq: Four times a day (QID) | ORAL | Status: DC | PRN

## 2013-08-20 NOTE — Progress Notes (Signed)
Subjective: 2 Days Post-Op Procedure(s) (LRB): RIGHT TOTAL KNEE ARTHROPLASTY (Right) Patient reports pain as 2 on 0-10 scale. Doing well today. Will DC tomorrow.   Objective: Vital signs in last 24 hours: Temp:  [97.8 F (36.6 C)-99.2 F (37.3 C)] 99.2 F (37.3 C) (07/31 0555) Pulse Rate:  [77-99] 85 (07/31 0555) Resp:  [16-20] 16 (07/31 0555) BP: (111-135)/(52-71) 111/52 mmHg (07/31 0555) SpO2:  [96 %-100 %] 97 % (07/31 0555)  Intake/Output from previous day: 07/30 0701 - 07/31 0700 In: 1001.7 [P.O.:600; I.V.:401.7] Out: 1175 [Urine:1175] Intake/Output this shift:     Recent Labs  08/19/13 0453 08/20/13 0500  HGB 11.7* 10.4*    Recent Labs  08/19/13 0453 08/20/13 0500  WBC 16.0* 14.0*  RBC 4.19 3.81*  HCT 35.8* 32.3*  PLT 255 243    Recent Labs  08/19/13 0453 08/20/13 0500  NA 134* 132*  K 4.5 4.2  CL 96 93*  CO2 27 27  BUN 11 20  CREATININE 0.76 1.16*  GLUCOSE 143* 119*  CALCIUM 9.5 9.1   No results found for this basename: LABPT, INR,  in the last 72 hours  Dorsiflexion/Plantar flexion intact  Assessment/Plan: 2 Days Post-Op Procedure(s) (LRB): RIGHT TOTAL KNEE ARTHROPLASTY (Right) Up with therapy discontinued Saturday.  Belisa Eichholz A 08/20/2013, 7:15 AM

## 2013-08-20 NOTE — Progress Notes (Signed)
Physical Therapy Treatment Patient Details Name: Kelsey LemonsDorothy R Rybicki MRN: [id number] DOB: 1960-04-07 Today's Date: 08/20/2013    History of Present Illness Pt is s/p R TKA    PT Comments    Progressing steadily  Follow Up Recommendations  Home health PT     Equipment Recommendations  Rolling walker with 5" wheels    Recommendations for Other Services OT consult     Precautions / Restrictions Precautions Precautions: Knee;Fall Required Braces or Orthoses: Knee Immobilizer - Right Knee Immobilizer - Right: Discontinue once straight leg raise with < 10 degree lag Restrictions Weight Bearing Restrictions: No Other Position/Activity Restrictions: WBAT    Mobility  Bed Mobility Overal bed mobility: Needs Assistance Bed Mobility: Supine to Sit     Supine to sit: Min assist Sit to supine: Min assist   General bed mobility comments:  (assist for R LE off the bed.)  Transfers Overall transfer level: Needs assistance Equipment used: Rolling walker (2 wheeled) Transfers: Sit to/from Stand Sit to Stand: Min assist         General transfer comment: verbal cues for hand placement and LE management.  Ambulation/Gait Ambulation/Gait assistance: Min assist Ambulation Distance (Feet): 75 Feet Assistive device: Rolling walker (2 wheeled) Gait Pattern/deviations: Step-to pattern;Decreased step length - right;Decreased step length - left;Shuffle;Trunk flexed Gait velocity: decr   General Gait Details: Cues for posture, sequence, position from [employer]W   Stairs            Wheelchair Mobility    Modified Rankin (Stroke Patients Only)       Balance                                    Cognition Arousal/Alertness: Awake/alert Behavior During Therapy: WFL for tasks assessed/performed Overall Cognitive Status: Within Functional Limits for tasks assessed                      Exercises Total Joint Exercises Ankle Circles/Pumps: AROM;Both;15  reps;Supine Quad Sets: AROM;Both;Supine;15 reps Heel Slides: AAROM;15 reps;Supine;Right Straight Leg Raises: AAROM;Right;Supine;15 reps Goniometric ROM: AAROM at R knee -5 - 50    General Comments        Pertinent Vitals/Pain 5/10; premed, ice pack provided    Home Living Family/patient expects to be discharged to:: Private residence Living Arrangements: Spouse/significant other Available Help at Discharge: Family Type of Home: House Home Access: Stairs to enter Entrance Stairs-Rails: None Home Layout: Able to live on main level with bedroom/bathroom Home Equipment: Environmental consultantWalker - 2 wheels;Walker - 4 wheels;Toilet riser;Shower seat      Prior Function Level of Independence: Independent with assistive device(s);Needs assistance      Comments: Husband has been doing majority of cooking and cleaning   PT Goals (current goals can now be found in the care plan section) Acute Rehab PT Goals Patient Stated Goal: be able to work out at the gym PT Goal Formulation: With patient Time For Goal Achievement: 08/27/13 Potential to Achieve Goals: Good Progress towards PT goals: Progressing toward goals    Frequency  7X/week    PT Plan Current plan remains appropriate    Co-evaluation             End of Session Equipment Utilized During Treatment: Gait belt;Right knee immobilizer Activity Tolerance: Patient tolerated treatment well Patient left: in bed;with call bell/phone within reach;with family/visitor present     Time: 0930-1008 PT Time Calculation (min):  38 min  Charges:  $Gait Training: 23-37 mins $Therapeutic Exercise: 8-22 mins                    G Codes:      Cheron Pasquarelli Aug 27, 2013, 12:23 PM

## 2013-08-20 NOTE — Evaluation (Addendum)
Occupational Therapy Evaluation Patient Details Name: Kelsey Simmons MRN: [id number] DOB: 1960-10-08 Today's Date: 08/20/2013    History of Present Illness Pt is s/p R TKA   Clinical Impression   Pt overall at min to mod assist with ADL. Will benefit from skilled OT services to further educate on AE options and practice functional transfers.     Follow Up Recommendations  No OT follow up;Supervision/Assistance - 24 hour    Equipment Recommendations  3 in 1 bedside comode    Recommendations for Other Services       Precautions / Restrictions Precautions Precautions: Knee;Fall Required Braces or Orthoses: Knee Immobilizer - Right Knee Immobilizer - Right: Discontinue once straight leg raise with < 10 degree lag Restrictions Weight Bearing Restrictions: No Other Position/Activity Restrictions: WBAT      Mobility Bed Mobility Overal bed mobility: Needs Assistance Bed Mobility: Supine to Sit     Supine to sit: Min assist     General bed mobility comments:  (assist for R LE off the bed.)  Transfers Overall transfer level: Needs assistance Equipment used: Rolling walker (2 wheeled) Transfers: Sit to/from Stand Sit to Stand: Min assist         General transfer comment: verbal cues for hand placement and LE management.    Balance                                            ADL Overall ADL's : Needs assistance/impaired Eating/Feeding: Independent;Sitting   Grooming: Wash/dry hands;Set up;Sitting   Upper Body Bathing: Set up;Sitting   Lower Body Bathing: Minimal assistance;Sit to/from stand   Upper Body Dressing : Set up;Sitting   Lower Body Dressing: Moderate assistance;Sit to/from stand   Toilet Transfer: Minimal assistance;Ambulation;BSC;RW   Toileting- Clothing Manipulation and Hygiene: Minimal assistance;Sit to/from stand         General ADL Comments: Pt only has a half bath downstairs where she will be staying. She will sponge  bathe initially and discussed use of 3in1 as chair in front of sink to bathe. She wanted to try to use her riser but needs armrests of 3in1 for support to help with standing up. She is agreeable to getting a 3in1. Educated on AE options as husband can help some but she would like to be more independent initialy. Demonstrated all uses but has not practiced yet.      Vision                     Perception     Praxis      Pertinent Vitals/Pain 7/10 R knee at rest and with activity. Reposition, ice.     Hand Dominance     Extremity/Trunk Assessment Upper Extremity Assessment Upper Extremity Assessment: Overall WFL for tasks assessed           Communication Communication Communication: No difficulties   Cognition Arousal/Alertness: Awake/alert Behavior During Therapy: WFL for tasks assessed/performed Overall Cognitive Status: Within Functional Limits for tasks assessed                     General Comments       Exercises       Shoulder Instructions      Home Living Family/patient expects to be discharged to:: Private residence Living Arrangements: Spouse/significant other Available Help at Discharge: Family Type of Home: House Home Access:  Stairs to enter Entergy CorporationEntrance Stairs-Number of Steps: 1 Entrance Stairs-Rails: None Home Layout: Able to live on main level with bedroom/bathroom     Bathroom Shower/Tub: Tub/shower unit (upstairs)   Bathroom Toilet: Standard     Home Equipment: Environmental consultantWalker - 2 wheels;Walker - 4 wheels;Toilet riser;Shower seat          Prior Functioning/Environment Level of Independence: Independent with assistive device(s);Needs assistance        Comments: Husband has been doing majority of cooking and cleaning    OT Diagnosis: Generalized weakness   OT Problem List: Decreased strength;Decreased knowledge of use of DME or AE   OT Treatment/Interventions: Self-care/ADL training;Patient/family education;Therapeutic  activities;DME and/or AE instruction    OT Goals(Current goals can be found in the care plan section) Acute Rehab OT Goals Patient Stated Goal: be able to work out at the gym OT Goal Formulation: With patient Time For Goal Achievement: 08/27/13 Potential to Achieve Goals: Good ADL Goals Pt Will Perform Lower Body Bathing: with adaptive equipment;sit to/from stand;with min guard assist Pt Will Perform Lower Body Dressing: with min guard assist;with adaptive equipment;sit to/from stand Pt Will Transfer to Toilet: with min guard assist;ambulating;bedside commode Pt Will Perform Toileting - Clothing Manipulation and hygiene: with min guard assist;sit to/from stand  OT Frequency: Min 2X/week   Barriers to D/C:            Co-evaluation              End of Session Equipment Utilized During Treatment: Gait belt;Rolling walker;Right knee immobilizer  Activity Tolerance: Patient tolerated treatment well Patient left: in chair;with call bell/phone within reach   Time: 1019-1046 OT Time Calculation (min): 27 min Charges:  OT General Charges $OT Visit: 1 Procedure OT Evaluation $Initial OT Evaluation Tier I: 1 Procedure OT Treatments $Self Care/Home Management : 8-22 mins $Therapeutic Activity: 8-22 mins G-Codes:    [name], [name] [phone] 08/20/2013, 11:00 AM

## 2013-08-20 NOTE — Plan of Care (Signed)
Problem: Consults Goal: Diagnosis- Total Joint Replacement Outcome: Completed/Met Date Met:  08/20/13 Primary Total Knee RIGHT  Problem: Phase III Progression Outcomes Goal: Anticoagulant follow-up in place Outcome: Not Applicable Date Met:  61/04/24 Xarelto VTE, no f/u needed.

## 2013-08-20 NOTE — Progress Notes (Signed)
Physical Therapy Treatment Patient Details Name: Kelsey Simmons MRN: [id number] DOB: 1960/08/13 Today's Date: 08/20/2013    History of Present Illness Pt is s/p R TKA    PT Comments    Steady progress.  Reviewed stairs and don/doff KI this pm  Follow Up Recommendations  Home health PT     Equipment Recommendations  Rolling walker with 5" wheels    Recommendations for Other Services OT consult     Precautions / Restrictions Precautions Precautions: Knee;Fall Required Braces or Orthoses: Knee Immobilizer - Right Knee Immobilizer - Right: Discontinue once straight leg raise with < 10 degree lag Restrictions Weight Bearing Restrictions: No Other Position/Activity Restrictions: WBAT    Mobility  Bed Mobility Overal bed mobility: Needs Assistance Bed Mobility: Supine to Sit;Sit to Supine     Supine to sit: Min guard Sit to supine: Min guard   General bed mobility comments: Pt self assisting R LE with UEs and cues  Transfers Overall transfer level: Needs assistance Equipment used: Rolling walker (2 wheeled) Transfers: Sit to/from Stand Sit to Stand: Min guard         General transfer comment: verbal cues for hand placement and LE management.  Ambulation/Gait Ambulation/Gait assistance: Min guard Ambulation Distance (Feet): 80 Feet Assistive device: Rolling walker (2 wheeled) Gait Pattern/deviations: Step-to pattern;Decreased step length - right;Decreased step length - left;Shuffle;Trunk flexed Gait velocity: decr   General Gait Details: Cues for posture, sequence, position from RW   Stairs Stairs: Yes Stairs assistance: Min assist Stair Management: No rails;Step to pattern;With walker;Backwards Number of Stairs: 3 General stair comments: cues for sequence and foot/RW placement  Wheelchair Mobility    Modified Rankin (Stroke Patients Only)       Balance                                    Cognition Arousal/Alertness:  Awake/alert Behavior During Therapy: WFL for tasks assessed/performed Overall Cognitive Status: Within Functional Limits for tasks assessed                      Exercises      General Comments        Pertinent Vitals/Pain 4/10; premed, ice packs provided    Home Living Family/patient expects to be discharged to:: Private residence Living Arrangements: Spouse/significant other Available Help at Discharge: Family Type of Home: House Home Access: Stairs to enter Entrance Stairs-Rails: None Home Layout: Able to live on main level with bedroom/bathroom Home Equipment: Environmental consultant - 2 wheels;Walker - 4 wheels;Toilet riser;Shower seat      Prior Function Level of Independence: Independent with assistive device(s);Needs assistance      Comments: Husband has been doing majority of cooking and cleaning   PT Goals (current goals can now be found in the care plan section) Acute Rehab PT Goals Patient Stated Goal: be able to work out at the gym PT Goal Formulation: With patient Time For Goal Achievement: 08/27/13 Potential to Achieve Goals: Good Progress towards PT goals: Progressing toward goals    Frequency  7X/week    PT Plan Current plan remains appropriate    Co-evaluation             End of Session Equipment Utilized During Treatment: Gait belt;Right knee immobilizer Activity Tolerance: Patient tolerated treatment well Patient left: in bed;with call bell/phone within reach;with family/visitor present     Time: 4098-1191 PT Time Calculation (min):  42 min  Charges:  $Gait Training: 23-37 mins $Therapeutic Activity: 8-22 mins                    G Codes:      Amaryllis Malmquist 08/20/2013, 2:37 PM

## 2013-08-21 LAB — CBC
HCT: 31.8 % — ABNORMAL LOW (ref 36.0–46.0)
Hemoglobin: 10.1 g/dL — ABNORMAL LOW (ref 12.0–15.0)
MCH: 27.4 pg (ref 26.0–34.0)
MCHC: 31.8 g/dL (ref 30.0–36.0)
MCV: 86.4 fL (ref 78.0–100.0)
Platelets: 230 10*3/uL (ref 150–400)
RBC: 3.68 MIL/uL — AB (ref 3.87–5.11)
RDW: 15.3 % (ref 11.5–15.5)
WBC: 14.1 10*3/uL — AB (ref 4.0–10.5)

## 2013-08-21 NOTE — Progress Notes (Signed)
Discharged from floor via w/c, spouse with pt. No changes in assessment. Kelsey Simmons   

## 2013-08-21 NOTE — Progress Notes (Signed)
Occupational Therapy Treatment Patient Details Name: Kelsey Simmons MRN: [id number] DOB: 01/19/1961 Today's Date: 08/21/2013    History of present illness Pt is s/p R TKA   OT comments  Pt making progress with functional goals. D/c home today  Follow Up Recommendations  No OT follow up;Supervision/Assistance - 24 hour    Equipment Recommendations  3 in 1 bedside comode    Recommendations for Other Services      Precautions / Restrictions Precautions Precautions: Knee;Fall Required Braces or Orthoses: Knee Immobilizer - Right Knee Immobilizer - Right: Discontinue once straight leg raise with < 10 degree lag Restrictions Weight Bearing Restrictions: No Other Position/Activity Restrictions: WBAT       Mobility Bed Mobility Overal bed mobility: Needs Assistance Bed Mobility: Sit to Supine       Sit to supine: Supervision      Transfers Overall transfer level: Needs assistance Equipment used: Standard walker Transfers: Sit to/from Stand Sit to Stand: Supervision              Balance Overall balance assessment: Needs assistance Sitting-balance support: No upper extremity supported;Feet supported Sitting balance-Leahy Scale: Good     Standing balance support: Single extremity supported;No upper extremity supported;During functional activity Standing balance-Leahy Scale: Fair                     ADL       Grooming: Wash/dry hands;Wash/dry face;Set up;Min guard;Standing       Lower Body Bathing: Minimal assistance;Sit to/from stand;Min guard       Lower Body Dressing: Sit to/from stand;Minimal assistance   Toilet Transfer: Ambulation;RW;Comfort height toilet;Min guard;Supervision/safety   Toileting- Clothing Manipulation and Hygiene: Sit to/from stand;Min guard       Functional mobility during ADLs: Supervision/safety;Min guard;Rolling walker        Vision  wears glasses                   Perception Perception Perception  Tested?: No   Praxis Praxis Praxis tested?: Not tested    Cognition   Behavior During Therapy: WFL for tasks assessed/performed Overall Cognitive Status: Within Functional Limits for tasks assessed                                                  General Comments  pt pleasant and cooperative    Pertinent Vitals/ Pain      5/10 R knee pain, VSS  Home Living                                          Prior Functioning/Environment  independent           Frequency Min 2X/week     Progress Toward Goals  OT Goals(current goals can now be found in the care plan section)  Progress towards OT goals: Progressing toward goals  Acute Rehab OT Goals Patient Stated Goal: be able to work out at the gym  Plan Discharge plan remains appropriate                     End of Session Equipment Utilized During Treatment: Rolling walker;Other (comment) (3 in 1)   Activity Tolerance Patient tolerated treatment well   Patient Left  with call bell/phone within reach;in bed             Time:  -     Charges: OT General Charges $OT Visit: 1 Procedure OT Treatments $Self Care/Home Management : 8-22 mins  Margaretmary EddySpencer, [name] 08/21/2013, 1:07 PM

## 2013-08-21 NOTE — Progress Notes (Signed)
CARE MANAGEMENT NOTE 08/21/2013  Patient:  Kelsey LemonsRICE,Eller R   Account Number:  [id number]  Date Initiated:  08/18/2013  Documentation initiated by:  DAVIS,RHONDA  Subjective/Objective Assessment:   tkr     Action/Plan:   home with hhc   Anticipated DC Date:  08/21/2013   Anticipated DC Plan:  HOME W HOME HEALTH SERVICES  In-house referral  NA      DC Planning Services  CM consult      [facility]  NA   Choice offered to / List presented to:  C-1 Patient   DME arranged  3-N-1  Levan HurstWALKER - ROLLING      DME agency  [facility]     [facility]  [facility]      [facility]  [facility]   Status of service:  In process, will continue to follow Medicare Important Message given?  NA - LOS <3 / Initial given by admissions (If response is "NO", the following Medicare IM given date fields will be blank) Date Medicare IM given:   Medicare IM given by:   Date Additional Medicare IM given:   Additional Medicare IM given by:    Discharge Disposition:  HOME W HOME HEALTH SERVICES  Per UR Regulation:  Reviewed for med. necessity/level of care/duration of stay  If discussed at Long Length of Stay Meetings, dates discussed:    Comments:  08/21/2013 1245 Notified [facility] of dc home with HH. Pt has 3n1 and RW for home. Isidoro DonningAlesia Gerasimos Plotts RN CCM Case Mgmt phone [phone]  [phone]/Rhonda Earlene PlaterDavis, RN,BSN,CCM: Case Management [phone] Chart reviewed for inpatient need and needs Discharge needs at time of review: none Next chart review due on [id number].

## 2013-08-21 NOTE — Progress Notes (Signed)
Subjective: 3 Days Post-Op Procedure(s) (LRB): RIGHT TOTAL KNEE ARTHROPLASTY (Right) Patient reports pain as well controlled. Soreness to right knee.  Reports no complications. Progress well with PT. Denies SOb, CP, or calf pain.  Reports she is ready to D/c home today.   Objective: Vital signs in last 24 hours: Temp:  [97.2 F (36.2 C)-98.2 F (36.8 C)] 97.8 F (36.6 C) (08/01 0556) Pulse Rate:  [89-110] 89 (08/01 0556) Resp:  [16-18] 16 (08/01 0556) BP: (117-124)/(58-71) 124/71 mmHg (08/01 0556) SpO2:  [99 %-100 %] 100 % (08/01 0556)  Intake/Output from previous day: 07/31 0701 - 08/01 0700 In: 480 [P.O.:480] Out: 1575 [Urine:1575] Intake/Output this shift:     Recent Labs  08/19/13 0453 08/20/13 0500 08/21/13 0525  HGB 11.7* 10.4* 10.1*    Recent Labs  08/20/13 0500 08/21/13 0525  WBC 14.0* 14.1*  RBC 3.81* 3.68*  HCT 32.3* 31.8*  PLT 243 230    Recent Labs  08/19/13 0453 08/20/13 0500  NA 134* 132*  K 4.5 4.2  CL 96 93*  CO2 27 27  BUN 11 20  CREATININE 0.76 1.16*  GLUCOSE 143* 119*  CALCIUM 9.5 9.1   No results found for this basename: LABPT, INR,  in the last 72 hours  Well nourished. Alert and oriented x3. RRR, Lungs clear, BS x4. Abdomen soft and non tender. Right Calf soft and non tender. Right knee dressing C/D/I. No DVT signs. Compartment soft. No signs of infection.  Right LE neurovascular intact.  Assessment/Plan: 3 Days Post-Op Procedure(s) (LRB): RIGHT TOTAL KNEE ARTHROPLASTY (Right) D/c home take meds as directed Follow instructions F/U in office in 2 weeks PT this am STILWELL, BRYSON L 08/21/2013, 7:37 AM

## 2013-08-21 NOTE — Progress Notes (Signed)
Physical Therapy Treatment Patient Details Name: NORMAJEAN NASH MRN: [id number] DOB: 1960/11/21 Today's Date: 08/21/2013    History of Present Illness Pt is s/p R TKA    PT Comments    Progressing well, IND SLR this am and ambulating sans KI.    Follow Up Recommendations  Home health PT     Equipment Recommendations  Rolling walker with 5" wheels    Recommendations for Other Services OT consult     Precautions / Restrictions Precautions Precautions: Knee;Fall Required Braces or Orthoses: Knee Immobilizer - Right Knee Immobilizer - Right: Discontinue once straight leg raise with < 10 degree lag Restrictions Weight Bearing Restrictions: No Other Position/Activity Restrictions: WBAT    Mobility  Bed Mobility Overal bed mobility: Needs Assistance Bed Mobility: Supine to Sit;Sit to Supine     Supine to sit: Supervision Sit to supine: Supervision   General bed mobility comments: min cues for sequence  Transfers Overall transfer level: Needs assistance Equipment used: Rolling walker (2 wheeled) Transfers: Sit to/from Stand Sit to Stand: Supervision            Ambulation/Gait Ambulation/Gait assistance: Supervision Ambulation Distance (Feet): 85 Feet Assistive device: Rolling walker (2 wheeled) Gait Pattern/deviations: Step-to pattern;Shuffle;Trunk flexed Gait velocity: decr   General Gait Details: Cues for posture, sequence, position from RW   Stairs Stairs: Yes Stairs assistance: Min assist Stair Management: No rails;Step to pattern;Backwards;With walker Number of Stairs: 1 General stair comments: cues for sequence and foot/RW placement; dtr present  Wheelchair Mobility    Modified Rankin (Stroke Patients Only)       Balance                                    Cognition Arousal/Alertness: Awake/alert Behavior During Therapy: WFL for tasks assessed/performed Overall Cognitive Status: Within Functional Limits for tasks  assessed                      Exercises Total Joint Exercises Ankle Circles/Pumps: AROM;Both;15 reps;Supine Quad Sets: AROM;Both;Supine;20 reps Heel Slides: AAROM;Supine;Right;20 reps Straight Leg Raises: Right;Supine;AROM;20 reps Goniometric ROM: AAROM R knee -10  - 80    General Comments        Pertinent Vitals/Pain 4/10; premed, ice packs provided    Home Living                      Prior Function            PT Goals (current goals can now be found in the care plan section) Acute Rehab PT Goals Patient Stated Goal: be able to work out at the gym PT Goal Formulation: With patient Time For Goal Achievement: 08/27/13 Potential to Achieve Goals: Good Progress towards PT goals: Progressing toward goals    Frequency  7X/week    PT Plan Current plan remains appropriate    Co-evaluation             End of Session Equipment Utilized During Treatment: Gait belt;Right knee immobilizer Activity Tolerance: Patient tolerated treatment well Patient left: in bed;with call bell/phone within reach;with family/visitor present     Time: 7829-5621 PT Time Calculation (min): 40 min  Charges:  $Gait Training: 8-22 mins $Therapeutic Exercise: 8-22 mins $Therapeutic Activity: 8-22 mins                    G Codes:  Yamir Carignan 08/21/2013, 12:14 PM

## 2013-08-22 NOTE — Discharge Summary (Signed)
Physician Discharge Summary  Patient ID: Kelsey Simmons MRN: [id number] DOB/AGE: Apr 23, 1960 53 y.o.  Admit date: 08/18/2013 Discharge date: 08/22/2013  Admission Diagnoses: Hip Pain  Discharge Diagnoses:  Active Problems:   Osteoarthritis of right knee   Hx of total knee arthroplasty   Discharged Condition: good  Hospital Course:  Kelsey Simmons is a 53 y.o. who [facility]. They were brought to the operating room on 08/18/2013 and underwent Procedure(s): RIGHT TOTAL KNEE ARTHROPLASTY.  Patient tolerated the procedure well and was later transferred to the recovery room and then to the orthopaedic floor for postoperative care.  They were given PO and IV analgesics for pain control following their surgery.  They were given 24 hours of postoperative antibiotics of  Anti-infectives   Start     Dose/Rate Route Frequency Ordered Stop   08/18/13 1600  ceFAZolin (ANCEF) IVPB 1 g/50 mL premix     1 g 100 mL/hr over 30 Minutes Intravenous Every 6 hours 08/18/13 1326 08/18/13 2202   08/18/13 1017  polymyxin B 500,000 Units, bacitracin 50,000 Units in sodium chloride irrigation 0.9 % 500 mL irrigation  Status:  Discontinued       As needed 08/18/13 1017 08/18/13 1211   08/18/13 0801  ceFAZolin (ANCEF) IVPB 2 g/50 mL premix     2 g 100 mL/hr over 30 Minutes Intravenous On call to O.R. 08/18/13 0801 08/18/13 0930     and started on DVT prophylaxis.   PT and OT were ordered for total joint protocol.  Discharge planning consulted to help with postop disposition and equipment needs.  Patient had a good night on the evening of surgery and started to get up OOB with therapy on day one.  Continued to work with therapy into day two.  Dressing was changed on day two and the incision was wNL.  By day three, the patient had progressed with therapy and meeting their goals.  Incision was healing well.  Patient was seen in rounds and [facility]  Consults:  N/A  Significant Diagnostic Studies: rountine  Treatments: rountine  Discharge Exam: Blood pressure 124/71, pulse 89, temperature 97.8 F (36.6 C), temperature source Oral, resp. rate 16, height 5\' 1"  (1.549 m), weight 113.399 kg (250 lb), SpO2 100.00%. Well nourished. Alert and oriented x3. RRR, Lungs clear, BS x4. Abdomen soft and non tender. Right Calf soft and non tender. Right hip dressing C/D/I. No DVT signs. Compartment soft. No signs of infection.  Right LE neurovascular intact.  Disposition: 01-Home or Self Care  Discharge Instructions   Call MD / Call 911    Complete by:  As directed   If you experience chest pain or shortness of breath, CALL 911 and be transported to the hospital emergency room.  If you develope a fever above 101 F, pus (white drainage) or increased drainage or redness at the wound, or calf pain, call your surgeon's office.     Constipation Prevention    Complete by:  As directed   Drink plenty of fluids.  Prune juice may be helpful.  You may use a stool softener, such as Colace (over the counter) 100 mg twice a day.  Use MiraLax (over the counter) for constipation as needed.     Diet general    Complete by:  As directed      Discharge instructions    Complete by:  As directed   Walk with your walker. Weight bearing as tolerated  Home Health Agency will follow you at home for your therapy Do not change the dressing over the incision unless there is excess drainage. Change dressing over drain site as needed.  Shower only, no tub bath. May start showering once you return home.  Call if any temperatures greater than 101 or any wound complications: [phone] during the day and ask for Dr. Jeannetta Ellis nurse, Mackey Birchwood.     Do not put a pillow under the knee. Place it under the heel.    Complete by:  As directed      Driving restrictions    Complete by:  As directed   No driving     Increase activity slowly as tolerated    Complete by:  As directed              Medication List    STOP taking these medications       aspirin EC 325 MG tablet     celecoxib 200 MG capsule  Commonly known as:  CELEBREX     traMADol 50 MG tablet  Commonly known as:  ULTRAM      TAKE these medications       DULoxetine 60 MG capsule  Commonly known as:  CYMBALTA  Take 60 mg by mouth every evening.     ferrous sulfate 325 (65 FE) MG tablet  Take 1 tablet (325 mg total) by mouth 3 (three) times daily after meals.     hydrochlorothiazide 25 MG tablet  Commonly known as:  HYDRODIURIL  Take 25 mg by mouth every morning.     methocarbamol 500 MG tablet  Commonly known as:  ROBAXIN  Take 1 tablet (500 mg total) by mouth every 6 (six) hours as needed for muscle spasms.     oxyCODONE-acetaminophen 5-325 MG per tablet  Commonly known as:  PERCOCET/ROXICET  Take 1-2 tablets by mouth every 4 (four) hours as needed for severe pain.     Rivaroxaban 15 & 20 MG Tbpk  Commonly known as:  XARELTO STARTER PACK  Take 20 mg by mouth daily. Take 10mg  (1/2 pill) daily for 18 days following discharge     SUMAtriptan 100 MG tablet  Commonly known as:  IMITREX  Take 100 mg by mouth every 2 (two) hours as needed for migraine or headache. May repeat in 2 hours if headache persists or recurs.     verapamil 180 MG CR tablet  Commonly known as:  CALAN-SR  Take 180 mg by mouth at bedtime.           Follow-up Information   Follow up with GIOFFRE,RONALD A, MD. Schedule an appointment as soon as possible for a visit in 2 weeks.   Specialty:  Orthopedic Surgery   Contact information:   [address] [place] [address] [phone]       Signed: Markham Jordan 08/22/2013, 7:26 AM

## 2013-08-26 ENCOUNTER — Encounter (HOSPITAL_COMMUNITY): Payer: BC Managed Care – PPO

## 2013-08-26 ENCOUNTER — Other Ambulatory Visit (HOSPITAL_COMMUNITY): Payer: Self-pay | Admitting: Orthopedic Surgery

## 2013-08-26 DIAGNOSIS — Z471 Aftercare following joint replacement surgery: Secondary | ICD-10-CM

## 2013-08-26 DIAGNOSIS — Z96698 Presence of other orthopedic joint implants: Principal | ICD-10-CM

## 2013-08-27 ENCOUNTER — Ambulatory Visit (HOSPITAL_COMMUNITY)
Admission: RE | Admit: 2013-08-27 | Discharge: 2013-08-27 | Disposition: A | Discharge status: Home / Self Care | Payer: BC Managed Care – PPO | Source: Ambulatory Visit | Attending: Cardiology | Admitting: Cardiology

## 2013-08-27 DIAGNOSIS — Z9660 Presence of unspecified orthopedic joint implant: Secondary | ICD-10-CM | POA: Insufficient documentation

## 2013-08-27 DIAGNOSIS — M79609 Pain in unspecified limb: Secondary | ICD-10-CM | POA: Insufficient documentation

## 2013-08-27 DIAGNOSIS — Z96698 Presence of other orthopedic joint implants: Secondary | ICD-10-CM

## 2013-08-27 DIAGNOSIS — Z471 Aftercare following joint replacement surgery: Secondary | ICD-10-CM | POA: Insufficient documentation

## 2013-08-27 NOTE — Progress Notes (Signed)
Right Lower Extremity Venous Duplex Completed. No evidence for DVT or SVT. °Brianna L Mazza,RVT °

## 2013-09-01 ENCOUNTER — Telehealth (HOSPITAL_COMMUNITY): Payer: Self-pay | Admitting: *Deleted

## 2013-10-14 DIAGNOSIS — M5126 Other intervertebral disc displacement, lumbar region: Secondary | ICD-10-CM | POA: Insufficient documentation

## 2014-02-01 ENCOUNTER — Other Ambulatory Visit: Payer: Self-pay

## 2014-02-01 DIAGNOSIS — Z1231 Encounter for screening mammogram for malignant neoplasm of breast: Secondary | ICD-10-CM

## 2014-02-14 ENCOUNTER — Ambulatory Visit: Payer: Self-pay

## 2014-02-17 ENCOUNTER — Ambulatory Visit: Payer: Self-pay

## 2014-02-18 ENCOUNTER — Ambulatory Visit: Payer: Self-pay

## 2014-02-21 ENCOUNTER — Ambulatory Visit
Admission: RE | Admit: 2014-02-21 | Discharge: 2014-02-21 | Disposition: A | Discharge status: Home / Self Care | Payer: Self-pay | Source: Ambulatory Visit

## 2014-02-21 DIAGNOSIS — Z1231 Encounter for screening mammogram for malignant neoplasm of breast: Secondary | ICD-10-CM

## 2014-03-31 NOTE — Progress Notes (Signed)
Please put orders in Epic surgery 04-13-14 pre op 04-08-14 Thanks 

## 2014-04-06 NOTE — H&P (Signed)
TOTAL KNEE ADMISSION H&P  Patient is being admitted for left total knee arthroplasty.  Subjective:  Chief Complaint:left knee pain.  HPI: Kelsey Simmons, 54 y.o. female, has a history of pain and functional disability in the left knee due to arthritis and has failed non-surgical conservative treatments for greater than 12 weeks to includeNSAID's and/or analgesics, corticosteriod injections, viscosupplementation injections, use of assistive devices and activity modification.  Onset of symptoms was gradual, starting 5 years ago with gradually worsening course since that time. The patient noted no past surgery on the left knee(s).  Patient currently rates pain in the left knee(s) at 7 out of 10 with activity. Patient has night pain, worsening of pain with activity and weight bearing, pain that interferes with activities of daily living, pain with passive range of motion, crepitus and joint swelling.  Patient has evidence of periarticular osteophytes and joint space narrowing by imaging studies.  There is no active infection.  Patient Active Problem List   Diagnosis Date Noted  . Osteoarthritis of right knee 08/18/2013  . Hx of total knee arthroplasty 08/18/2013   Past Medical History  Diagnosis Date  . Hypertension   . Headache(784.0)     HX MIGRAINES  . Arthritis   . Fibromyalgia   . Sleep apnea     HAS NOT USED C PAP SINCE 2007 DUE TO WT LOSS     Past Surgical History  Procedure Laterality Date  . Gastric bypass  2006  . Abdominal hysterectomy  1997  . Cesarean section    . Bunionectomy  2012  . Total knee arthroplasty Right 08/18/2013    Procedure: RIGHT TOTAL KNEE ARTHROPLASTY;  Surgeon: Jacki Conesonald A Gioffre, MD;  Location: [facility];  Service: Orthopedics;  Laterality: Right;      Current outpatient prescriptions:  .  celecoxib (CELEBREX) 200 MG capsule, Take 200 mg by mouth daily., Disp: , Rfl:  .  DULoxetine (CYMBALTA) 30 MG capsule, Take 30 mg by mouth at bedtime., Disp: , Rfl:  .   hydrochlorothiazide (HYDRODIURIL) 25 MG tablet, Take 25 mg by mouth every morning., Disp: , Rfl:  .  omeprazole (PRILOSEC) 20 MG capsule, Take 20 mg by mouth daily., Disp: , Rfl:  .  propranolol ER (INDERAL LA) 60 MG 24 hr capsule, Take 180 mg by mouth at bedtime., Disp: , Rfl:  .  SUMAtriptan (IMITREX) 100 MG tablet, Take 100 mg by mouth every 2 (two) hours as needed for migraine or headache. May repeat in 2 hours if headache persists or recurs., Disp: , Rfl:  .  traMADol (ULTRAM) 50 MG tablet, Take 50 mg by mouth every 6 (six) hours as needed (Pain)., Disp: , Rfl:   No Known Allergies  History  Substance Use Topics  . Smoking status: Never Smoker   . Smokeless tobacco: No  . Alcohol Use: No      Review of Systems  Constitutional: Negative.   HENT: Negative for congestion, ear discharge, ear pain, hearing loss, nosebleeds, sore throat and tinnitus.        Migraines  Eyes: Negative.   Respiratory: Negative.  Negative for stridor.   Cardiovascular: Negative.   Gastrointestinal: Negative.   Genitourinary: Positive for frequency. Negative for dysuria, urgency, hematuria and flank pain.       Positive for incontinence  Musculoskeletal: Positive for back pain and joint pain. Negative for myalgias, falls and neck pain.       Left knee pain  Skin: Negative.   Neurological: Positive for  headaches. Negative for dizziness, tingling, tremors, sensory change, speech change, focal weakness, seizures and loss of consciousness.  Endo/Heme/Allergies: Negative.   Psychiatric/Behavioral: Negative.     Objective:  Physical Exam  Constitutional: She is oriented to person, place, and time. She appears well-developed. No distress.  Obese  HENT:  Head: Normocephalic and atraumatic.  Right Ear: External ear normal.  Left Ear: External ear normal.  Nose: Nose normal.  Mouth/Throat: Oropharynx is clear and moist.  Eyes: Conjunctivae and EOM are normal.  Neck: Normal range of motion. Neck supple.   Cardiovascular: Normal rate, regular rhythm, normal heart sounds and intact distal pulses.   No murmur heard. Respiratory: Effort normal and breath sounds normal. No respiratory distress. She has no wheezes.  GI: Soft. Bowel sounds are normal. She exhibits no distension. There is no tenderness.  Musculoskeletal:       Right hip: Normal.       Left hip: Normal.       Right knee: Normal.       Left knee: She exhibits decreased range of motion and swelling. She exhibits no effusion and no erythema. Tenderness found. Medial joint line and lateral joint line tenderness noted.  Neurological: She is alert and oriented to person, place, and time. She has normal strength and normal reflexes. No sensory deficit.  Skin: No rash noted. She is not diaphoretic. No erythema.  Psychiatric: She has a normal mood and affect. Her behavior is normal.   Vitals Weight: 245 lb Height: 61in Body Surface Area: 2.06 m Body Mass Index: 46.29 kg/m  Pulse: 72 (Regular)  BP: 134/82 (Sitting, Left Arm, Standard)   Imaging Review Plain radiographs demonstrate severe degenerative joint disease of the left knee(s). The overall alignment ismild varus. The bone quality appears to be good for age and reported activity level.  Assessment/Plan:  End stage primary osteoarthritis, left knee   The patient history, physical examination, clinical judgment of the provider and imaging studies are consistent with end stage degenerative joint disease of the left knee(s) and total knee arthroplasty is deemed medically necessary. The treatment options including medical management, injection therapy arthroscopy and arthroplasty were discussed at length. The risks and benefits of total knee arthroplasty were presented and reviewed. The risks due to aseptic loosening, infection, stiffness, patella tracking problems, thromboembolic complications and other imponderables were discussed. The patient acknowledged the explanation,  agreed to proceed with the plan and consent was signed. Patient is being admitted for inpatient treatment for surgery, pain control, PT, OT, prophylactic antibiotics, VTE prophylaxis, progressive ambulation and ADL's and discharge planning. The patient is planning to be discharged home with home health services   TXA IV PCP: Dr. Chancy Milroy, PA-C

## 2014-04-07 NOTE — Patient Instructions (Addendum)
21 Kelsey Simmons  04/07/2014   Your procedure is scheduled on:   04-13-2014 Wednesday  [facility] and [facility]. Arrive at      0530  AM.  Call this number if you have problems the morning of surgery: [phone]  Or Presurgical Testing [phone].   For Living Will and/or Health Care Power Attorney Forms: please provide copy for your medical record,may bring AM of surgery(Forms should be already notarized -we do not provide this service).(04-08-14  No, information given today).     Reminder: Do not eat food/ or drink: After Midnight.      Take these medicines the morning of surgery with A SIP OF WATER: Omeprazole.  Tramadol-if need.   Do not wear jewelry, make-up or nail polish.  Do not wear deodorant, lotions, powders, or perfumes.   Do not shave legs and under arms- 48 hours(2 days) prior to first CHG shower.(Shaving face and neck okay.)  Do not bring valuables to the hospital.(Hospital is not responsible for lost valuables).  Contacts, dentures or removable bridgework, body piercing, hair pins may not be worn into surgery.  Leave suitcase in the car. After surgery it may be brought to your room.  For patients admitted to the hospital, checkout time is 11:00 AM the day of discharge.(Restricted visitors-Any Persons displaying flu-like symptoms or illness).    Patients discharged the day of surgery will not be allowed to drive home. Must have responsible person with you x 24 hours once discharged.  Name and phone number of your driver:Wiliiam -spouse [phone] cell      Please read over the following fact sheets that you were given:  CHG(Chlorhexidine Gluconate 4% Surgical Soap) use, MRSA Information.  Remember : Type/Screen "Blue armbands" - may not be removed once applied(would result in being retested AM of surgery, if removed).         [place] - Preparing for Surgery Before surgery, you can play an important role.   Because skin is not sterile, your skin needs to be as free of germs as possible.  You can reduce the number of germs on your skin by washing with CHG (chlorahexidine gluconate) soap before surgery.  CHG is an antiseptic cleaner which kills germs and bonds with the skin to continue killing germs even after washing. Please DO NOT use if you have an allergy to CHG or antibacterial soaps.  If your skin becomes reddened/irritated stop using the CHG and inform your nurse when you arrive at Short Stay. Do not shave (including legs and underarms) for at least 48 hours prior to the first CHG shower.  You may shave your face/neck. Please follow these instructions carefully:  1.  Shower with CHG Soap the night before surgery and the  morning of Surgery.  2.  If you choose to wash your hair, wash your hair first as usual with your  normal  shampoo.  3.  After you shampoo, rinse your hair and body thoroughly to remove the  shampoo.                           4.  Use CHG as you would any other liquid soap.  You can apply chg directly  to the skin and wash                       Gently with a scrungie or  clean washcloth.  5.  Apply the CHG Soap to your body ONLY FROM THE NECK DOWN.   Do not use on face/ open                           Wound or open sores. Avoid contact with eyes, ears mouth and genitals (private parts).                       Wash face,  Genitals (private parts) with your normal soap.             6.  Wash thoroughly, paying special attention to the area where your surgery  will be performed.  7.  Thoroughly rinse your body with warm water from the neck down.  8.  DO NOT shower/wash with your normal soap after using and rinsing off  the CHG Soap.                9.  Pat yourself dry with a clean towel.            10.  Wear clean pajamas.            11.  Place clean sheets on your bed the night of your first shower and do not  sleep with pets. Day of Surgery : Do not apply any lotions/deodorants the  morning of surgery.  Please wear clean clothes to the hospital/surgery center.  FAILURE TO FOLLOW THESE INSTRUCTIONS MAY RESULT IN THE CANCELLATION OF YOUR SURGERY PATIENT SIGNATURE_________________________________  NURSE SIGNATURE__________________________________  ________________________________________________________________________

## 2014-04-08 ENCOUNTER — Encounter (HOSPITAL_COMMUNITY): Payer: Self-pay

## 2014-04-08 ENCOUNTER — Encounter (HOSPITAL_COMMUNITY)
Admission: RE | Admit: 2014-04-08 | Discharge: 2014-04-08 | Disposition: A | Discharge status: Home / Self Care | Payer: BLUE CROSS/BLUE SHIELD | Source: Ambulatory Visit | Attending: Orthopedic Surgery | Admitting: Orthopedic Surgery

## 2014-04-08 DIAGNOSIS — Z01812 Encounter for preprocedural laboratory examination: Secondary | ICD-10-CM | POA: Diagnosis present

## 2014-04-08 LAB — BASIC METABOLIC PANEL
Anion gap: 7 (ref 5–15)
BUN: 19 mg/dL (ref 6–23)
CO2: 30 mmol/L (ref 19–32)
Calcium: 8.9 mg/dL (ref 8.4–10.5)
Chloride: 103 mmol/L (ref 96–112)
Creatinine, Ser: 1.01 mg/dL (ref 0.50–1.10)
GFR calc non Af Amer: 63 mL/min — ABNORMAL LOW (ref >90)
GFR, EST AFRICAN AMERICAN: 73 mL/min — AB (ref >90)
Glucose, Bld: 95 mg/dL (ref 70–99)
POTASSIUM: 3.9 mmol/L (ref 3.5–5.1)
SODIUM: 140 mmol/L (ref 135–145)

## 2014-04-08 LAB — SURGICAL PCR SCREEN
MRSA, PCR: NEGATIVE
Staphylococcus aureus: NEGATIVE

## 2014-04-08 LAB — CBC
HEMATOCRIT: 36 % (ref 36.0–46.0)
Hemoglobin: 10.8 g/dL — ABNORMAL LOW (ref 12.0–15.0)
MCH: 25.1 pg — ABNORMAL LOW (ref 26.0–34.0)
MCHC: 30 g/dL (ref 30.0–36.0)
MCV: 83.7 fL (ref 78.0–100.0)
Platelets: 286 10*3/uL (ref 150–400)
RBC: 4.3 MIL/uL (ref 3.87–5.11)
RDW: 15.8 % — AB (ref 11.5–15.5)
WBC: 8.5 10*3/uL (ref 4.0–10.5)

## 2014-04-08 LAB — PROTIME-INR
INR: 1.02 (ref 0.00–1.49)
PROTHROMBIN TIME: 13.5 s (ref 11.6–15.2)

## 2014-04-08 LAB — APTT: aPTT: 30 seconds (ref 24–37)

## 2014-04-08 NOTE — Pre-Procedure Instructions (Signed)
04-08-14 EKG 7'15/ CXR 7'15 Epic. Clearance note(Dr. Valentina LucksGriffin) 01-31-14 with chart.

## 2014-04-08 NOTE — Progress Notes (Signed)
04-08-14 1400 Pt here now for PAT visit need MD order entry in Epic.

## 2014-04-12 ENCOUNTER — Other Ambulatory Visit: Payer: Self-pay | Admitting: Surgical

## 2014-04-12 NOTE — Progress Notes (Signed)
[facility]Called [facility] office to request orders for surgery 04/13/14.

## 2014-04-13 ENCOUNTER — Inpatient Hospital Stay (HOSPITAL_COMMUNITY)
Admission: RE | Admit: 2014-04-13 | Discharge: 2014-04-15 | DRG: 470 | Disposition: A | Discharge status: Home Health | Payer: BLUE CROSS/BLUE SHIELD | Source: Ambulatory Visit | Attending: Orthopedic Surgery | Admitting: Orthopedic Surgery

## 2014-04-13 ENCOUNTER — Encounter (HOSPITAL_COMMUNITY)
Admission: RE | Disposition: A | Discharge status: Home Health | Payer: Self-pay | Source: Ambulatory Visit | Attending: Orthopedic Surgery

## 2014-04-13 ENCOUNTER — Inpatient Hospital Stay (HOSPITAL_COMMUNITY): Payer: BLUE CROSS/BLUE SHIELD | Admitting: Anesthesiology

## 2014-04-13 ENCOUNTER — Encounter (HOSPITAL_COMMUNITY): Payer: Self-pay | Admitting: *Deleted

## 2014-04-13 DIAGNOSIS — Z90710 Acquired absence of both cervix and uterus: Secondary | ICD-10-CM

## 2014-04-13 DIAGNOSIS — K219 Gastro-esophageal reflux disease without esophagitis: Secondary | ICD-10-CM | POA: Diagnosis present

## 2014-04-13 DIAGNOSIS — I10 Essential (primary) hypertension: Secondary | ICD-10-CM | POA: Diagnosis present

## 2014-04-13 DIAGNOSIS — Z01812 Encounter for preprocedural laboratory examination: Secondary | ICD-10-CM

## 2014-04-13 DIAGNOSIS — M797 Fibromyalgia: Secondary | ICD-10-CM | POA: Diagnosis present

## 2014-04-13 DIAGNOSIS — Z79899 Other long term (current) drug therapy: Secondary | ICD-10-CM

## 2014-04-13 DIAGNOSIS — Z9884 Bariatric surgery status: Secondary | ICD-10-CM | POA: Diagnosis not present

## 2014-04-13 DIAGNOSIS — M1712 Unilateral primary osteoarthritis, left knee: Principal | ICD-10-CM | POA: Diagnosis present

## 2014-04-13 DIAGNOSIS — Z96659 Presence of unspecified artificial knee joint: Secondary | ICD-10-CM

## 2014-04-13 DIAGNOSIS — M25562 Pain in left knee: Secondary | ICD-10-CM | POA: Diagnosis present

## 2014-04-13 HISTORY — PX: TOTAL KNEE ARTHROPLASTY: SHX125

## 2014-04-13 LAB — COMPREHENSIVE METABOLIC PANEL
ALT: 15 U/L (ref 0–35)
AST: 18 U/L (ref 0–37)
Albumin: 3.7 g/dL (ref 3.5–5.2)
Alkaline Phosphatase: 91 U/L (ref 39–117)
Anion gap: 10 (ref 5–15)
BUN: 19 mg/dL (ref 6–23)
CO2: 30 mmol/L (ref 19–32)
Calcium: 9 mg/dL (ref 8.4–10.5)
Chloride: 101 mmol/L (ref 96–112)
Creatinine, Ser: 0.89 mg/dL (ref 0.50–1.10)
GFR calc Af Amer: 85 mL/min — ABNORMAL LOW (ref >90)
GFR calc non Af Amer: 73 mL/min — ABNORMAL LOW (ref >90)
Glucose, Bld: 100 mg/dL — ABNORMAL HIGH (ref 70–99)
Potassium: 3.9 mmol/L (ref 3.5–5.1)
Sodium: 141 mmol/L (ref 135–145)
Total Bilirubin: 0.3 mg/dL (ref 0.3–1.2)
Total Protein: 7.6 g/dL (ref 6.0–8.3)

## 2014-04-13 LAB — URINALYSIS, ROUTINE W REFLEX MICROSCOPIC
Bilirubin Urine: NEGATIVE
Glucose, UA: NEGATIVE mg/dL
Hgb urine dipstick: NEGATIVE
Ketones, ur: NEGATIVE mg/dL
Leukocytes, UA: NEGATIVE
Nitrite: NEGATIVE
Protein, ur: NEGATIVE mg/dL
Specific Gravity, Urine: 1.027 (ref 1.005–1.030)
Urobilinogen, UA: 0.2 mg/dL (ref 0.0–1.0)
pH: 7 (ref 5.0–8.0)

## 2014-04-13 LAB — CBC WITH DIFFERENTIAL/PLATELET
Basophils Absolute: 0 10*3/uL (ref 0.0–0.1)
Basophils Relative: 0 % (ref 0–1)
Eosinophils Absolute: 0.1 10*3/uL (ref 0.0–0.7)
Eosinophils Relative: 2 % (ref 0–5)
HCT: 39.3 % (ref 36.0–46.0)
Hemoglobin: 12 g/dL (ref 12.0–15.0)
Lymphocytes Relative: 31 % (ref 12–46)
Lymphs Abs: 2.1 10*3/uL (ref 0.7–4.0)
MCH: 25.4 pg — ABNORMAL LOW (ref 26.0–34.0)
MCHC: 30.5 g/dL (ref 30.0–36.0)
MCV: 83.1 fL (ref 78.0–100.0)
Monocytes Absolute: 0.5 10*3/uL (ref 0.1–1.0)
Monocytes Relative: 7 % (ref 3–12)
Neutro Abs: 4.1 10*3/uL (ref 1.7–7.7)
Neutrophils Relative %: 60 % (ref 43–77)
Platelets: 274 10*3/uL (ref 150–400)
RBC: 4.73 MIL/uL (ref 3.87–5.11)
RDW: 15.7 % — ABNORMAL HIGH (ref 11.5–15.5)
WBC: 6.8 10*3/uL (ref 4.0–10.5)

## 2014-04-13 LAB — TYPE AND SCREEN
ABO/RH(D): B POS
ANTIBODY SCREEN: NEGATIVE

## 2014-04-13 LAB — PROTIME-INR
INR: 1 (ref 0.00–1.49)
Prothrombin Time: 13.3 seconds (ref 11.6–15.2)

## 2014-04-13 LAB — APTT: aPTT: 28 seconds (ref 24–37)

## 2014-04-13 SURGERY — ARTHROPLASTY, KNEE, TOTAL
Anesthesia: General | Laterality: Left

## 2014-04-13 MED ORDER — SODIUM CHLORIDE 0.9 % IJ SOLN
INTRAMUSCULAR | Status: AC
  Filled 2014-04-13: qty 3

## 2014-04-13 MED ORDER — POLYETHYLENE GLYCOL 3350 17 G PO PACK: 17.0000 g | PACK | Freq: Every day | ORAL | Status: DC | PRN

## 2014-04-13 MED ORDER — LIDOCAINE HCL (CARDIAC) 20 MG/ML IV SOLN
INTRAVENOUS | Status: DC | PRN
  Administered 2014-04-13: 100 mg via INTRAVENOUS

## 2014-04-13 MED ORDER — RIVAROXABAN 10 MG PO TABS
10.0000 mg | ORAL_TABLET | Freq: Every day | ORAL | Status: DC
  Administered 2014-04-14 – 2014-04-15 (×2): 10 mg via ORAL
  Filled 2014-04-13 (×3): qty 1

## 2014-04-13 MED ORDER — MIDAZOLAM HCL 5 MG/5ML IJ SOLN
INTRAMUSCULAR | Status: DC | PRN
  Administered 2014-04-13: 2 mg via INTRAVENOUS

## 2014-04-13 MED ORDER — ONDANSETRON HCL 4 MG/2ML IJ SOLN
INTRAMUSCULAR | Status: DC | PRN
  Administered 2014-04-13: 4 mg via INTRAVENOUS

## 2014-04-13 MED ORDER — FLEET ENEMA 7-19 GM/118ML RE ENEM: 1.0000 | ENEMA | Freq: Once | RECTAL | Status: AC | PRN

## 2014-04-13 MED ORDER — HYDROMORPHONE HCL 1 MG/ML IJ SOLN
INTRAMUSCULAR | Status: AC
  Filled 2014-04-13: qty 1

## 2014-04-13 MED ORDER — EPHEDRINE SULFATE 50 MG/ML IJ SOLN
INTRAMUSCULAR | Status: DC | PRN
  Administered 2014-04-13 (×2): 10 mg via INTRAVENOUS

## 2014-04-13 MED ORDER — SODIUM CHLORIDE 0.9 % IR SOLN
Status: AC
  Filled 2014-04-13: qty 1

## 2014-04-13 MED ORDER — METHOCARBAMOL 1000 MG/10ML IJ SOLN
500.0000 mg | Freq: Four times a day (QID) | INTRAVENOUS | Status: DC | PRN
  Administered 2014-04-13: 500 mg via INTRAVENOUS
  Filled 2014-04-13 (×2): qty 5

## 2014-04-13 MED ORDER — DULOXETINE HCL 30 MG PO CPEP
30.0000 mg | ORAL_CAPSULE | Freq: Every day | ORAL | Status: DC
  Administered 2014-04-13 – 2014-04-14 (×2): 30 mg via ORAL
  Filled 2014-04-13 (×3): qty 1

## 2014-04-13 MED ORDER — CELECOXIB 200 MG PO CAPS
200.0000 mg | ORAL_CAPSULE | Freq: Two times a day (BID) | ORAL | Status: DC
  Administered 2014-04-13 – 2014-04-15 (×4): 200 mg via ORAL
  Filled 2014-04-13 (×5): qty 1

## 2014-04-13 MED ORDER — HYDROCHLOROTHIAZIDE 25 MG PO TABS
25.0000 mg | ORAL_TABLET | Freq: Every morning | ORAL | Status: DC
  Administered 2014-04-13 – 2014-04-15 (×3): 25 mg via ORAL
  Filled 2014-04-13 (×3): qty 1

## 2014-04-13 MED ORDER — HYDROCODONE-ACETAMINOPHEN 5-325 MG PO TABS
1.0000 | ORAL_TABLET | ORAL | Status: DC | PRN
  Administered 2014-04-13 – 2014-04-15 (×6): 2 via ORAL
  Filled 2014-04-13 (×6): qty 2

## 2014-04-13 MED ORDER — NEOSTIGMINE METHYLSULFATE 10 MG/10ML IV SOLN
INTRAVENOUS | Status: DC | PRN
Start: 2014-04-13 — End: 2014-04-13
  Administered 2014-04-13: 5 mg via INTRAVENOUS

## 2014-04-13 MED ORDER — MENTHOL 3 MG MT LOZG: 1.0000 | LOZENGE | OROMUCOSAL | Status: DC | PRN

## 2014-04-13 MED ORDER — TRANEXAMIC ACID 100 MG/ML IV SOLN
1000.0000 mg | INTRAVENOUS | Status: AC
  Administered 2014-04-13: 1000 mg via INTRAVENOUS
  Filled 2014-04-13: qty 10

## 2014-04-13 MED ORDER — CHLORHEXIDINE GLUCONATE 4 % EX LIQD: 60.0000 mL | Freq: Once | CUTANEOUS | Status: DC

## 2014-04-13 MED ORDER — CEFAZOLIN SODIUM-DEXTROSE 2-3 GM-% IV SOLR
INTRAVENOUS | Status: AC
  Filled 2014-04-13: qty 50

## 2014-04-13 MED ORDER — SUMATRIPTAN SUCCINATE 100 MG PO TABS
100.0000 mg | ORAL_TABLET | ORAL | Status: DC | PRN
  Administered 2014-04-13 – 2014-04-15 (×3): 100 mg via ORAL
  Filled 2014-04-13 (×6): qty 1

## 2014-04-13 MED ORDER — LIP MEDEX EX OINT
TOPICAL_OINTMENT | CUTANEOUS | Status: AC
  Filled 2014-04-13: qty 7

## 2014-04-13 MED ORDER — ACETAMINOPHEN 650 MG RE SUPP: 650.0000 mg | Freq: Four times a day (QID) | RECTAL | Status: DC | PRN

## 2014-04-13 MED ORDER — LIDOCAINE HCL (CARDIAC) 20 MG/ML IV SOLN
INTRAVENOUS | Status: AC
  Filled 2014-04-13: qty 5

## 2014-04-13 MED ORDER — MIDAZOLAM HCL 2 MG/2ML IJ SOLN
INTRAMUSCULAR | Status: AC
  Filled 2014-04-13: qty 2

## 2014-04-13 MED ORDER — ONDANSETRON HCL 4 MG/2ML IJ SOLN
4.0000 mg | Freq: Four times a day (QID) | INTRAMUSCULAR | Status: DC | PRN
  Administered 2014-04-14: 4 mg via INTRAVENOUS
  Filled 2014-04-13: qty 2

## 2014-04-13 MED ORDER — SODIUM CHLORIDE 0.9 % IJ SOLN
INTRAMUSCULAR | Status: DC | PRN
  Administered 2014-04-13: 30 mL

## 2014-04-13 MED ORDER — PANTOPRAZOLE SODIUM 40 MG PO TBEC
40.0000 mg | DELAYED_RELEASE_TABLET | Freq: Every day | ORAL | Status: DC
  Administered 2014-04-14 – 2014-04-15 (×2): 40 mg via ORAL
  Filled 2014-04-13 (×2): qty 1

## 2014-04-13 MED ORDER — PROPRANOLOL HCL ER 60 MG PO CP24
180.0000 mg | ORAL_CAPSULE | Freq: Every day | ORAL | Status: DC
  Administered 2014-04-13: 180 mg via ORAL
  Filled 2014-04-13 (×3): qty 1

## 2014-04-13 MED ORDER — ALUM & MAG HYDROXIDE-SIMETH 200-200-20 MG/5ML PO SUSP: 30.0000 mL | ORAL | Status: DC | PRN

## 2014-04-13 MED ORDER — DEXAMETHASONE SODIUM PHOSPHATE 10 MG/ML IJ SOLN
INTRAMUSCULAR | Status: DC | PRN
  Administered 2014-04-13: 10 mg via INTRAVENOUS

## 2014-04-13 MED ORDER — SODIUM CHLORIDE 0.9 % IJ SOLN
INTRAMUSCULAR | Status: AC
  Filled 2014-04-13: qty 50

## 2014-04-13 MED ORDER — BUPIVACAINE HCL (PF) 0.25 % IJ SOLN
INTRAMUSCULAR | Status: AC
  Filled 2014-04-13: qty 30

## 2014-04-13 MED ORDER — BISACODYL 5 MG PO TBEC: 5.0000 mg | DELAYED_RELEASE_TABLET | Freq: Every day | ORAL | Status: DC | PRN

## 2014-04-13 MED ORDER — BUPIVACAINE LIPOSOME 1.3 % IJ SUSP
20.0000 mL | Freq: Once | INTRAMUSCULAR | Status: AC
  Administered 2014-04-13: 20 mL
  Filled 2014-04-13: qty 20

## 2014-04-13 MED ORDER — MEPERIDINE HCL 50 MG/ML IJ SOLN: 6.2500 mg | INTRAMUSCULAR | Status: DC | PRN

## 2014-04-13 MED ORDER — FERROUS SULFATE 325 (65 FE) MG PO TABS
325.0000 mg | ORAL_TABLET | Freq: Three times a day (TID) | ORAL | Status: DC
  Administered 2014-04-14 – 2014-04-15 (×2): 325 mg via ORAL
  Filled 2014-04-13 (×7): qty 1

## 2014-04-13 MED ORDER — HYDROMORPHONE HCL 1 MG/ML IJ SOLN
0.2500 mg | INTRAMUSCULAR | Status: DC | PRN
  Administered 2014-04-13 (×2): 0.5 mg via INTRAVENOUS
  Administered 2014-04-13 (×2): 0.25 mg via INTRAVENOUS
  Administered 2014-04-13 (×2): 0.5 mg via INTRAVENOUS

## 2014-04-13 MED ORDER — GLYCOPYRROLATE 0.2 MG/ML IJ SOLN
INTRAMUSCULAR | Status: AC
  Filled 2014-04-13: qty 3

## 2014-04-13 MED ORDER — PHENOL 1.4 % MT LIQD: 1.0000 | OROMUCOSAL | Status: DC | PRN

## 2014-04-13 MED ORDER — FENTANYL CITRATE 0.05 MG/ML IJ SOLN
INTRAMUSCULAR | Status: DC | PRN
  Administered 2014-04-13: 100 ug via INTRAVENOUS
  Administered 2014-04-13 (×2): 50 ug via INTRAVENOUS

## 2014-04-13 MED ORDER — SUMATRIPTAN SUCCINATE 100 MG PO TABS
100.0000 mg | ORAL_TABLET | ORAL | Status: DC | PRN
  Administered 2014-04-13: 100 mg via ORAL
  Filled 2014-04-13 (×2): qty 1

## 2014-04-13 MED ORDER — HYDROMORPHONE HCL 2 MG PO TABS: 2.0000 mg | ORAL_TABLET | ORAL | Status: DC | PRN

## 2014-04-13 MED ORDER — PROPOFOL 10 MG/ML IV BOLUS
INTRAVENOUS | Status: AC
  Filled 2014-04-13: qty 20

## 2014-04-13 MED ORDER — HYDROMORPHONE HCL 1 MG/ML IJ SOLN
1.0000 mg | INTRAMUSCULAR | Status: DC | PRN
  Administered 2014-04-13 – 2014-04-15 (×3): 1 mg via INTRAVENOUS
  Filled 2014-04-13 (×3): qty 1

## 2014-04-13 MED ORDER — POLYMYXIN B SULFATE 500000 UNITS IJ SOLR
INTRAMUSCULAR | Status: DC | PRN
  Administered 2014-04-13: 500 mL

## 2014-04-13 MED ORDER — CEFAZOLIN SODIUM-DEXTROSE 2-3 GM-% IV SOLR
2.0000 g | INTRAVENOUS | Status: AC
  Administered 2014-04-13: 2 g via INTRAVENOUS

## 2014-04-13 MED ORDER — FENTANYL CITRATE 0.05 MG/ML IJ SOLN
INTRAMUSCULAR | Status: AC
Start: 2014-04-13 — End: 2014-04-13
  Filled 2014-04-13: qty 5

## 2014-04-13 MED ORDER — ACETAMINOPHEN 325 MG PO TABS: 650.0000 mg | ORAL_TABLET | Freq: Four times a day (QID) | ORAL | Status: DC | PRN

## 2014-04-13 MED ORDER — ROCURONIUM BROMIDE 100 MG/10ML IV SOLN
INTRAVENOUS | Status: DC | PRN
  Administered 2014-04-13: 30 mg via INTRAVENOUS

## 2014-04-13 MED ORDER — NEOSTIGMINE METHYLSULFATE 10 MG/10ML IV SOLN
INTRAVENOUS | Status: AC
  Filled 2014-04-13: qty 1

## 2014-04-13 MED ORDER — LACTATED RINGERS IV SOLN
INTRAVENOUS | Status: DC
  Administered 2014-04-13 (×2): via INTRAVENOUS

## 2014-04-13 MED ORDER — THROMBIN 5000 UNITS EX SOLR
OROMUCOSAL | Status: DC | PRN
  Administered 2014-04-13: 08:00:00 via TOPICAL

## 2014-04-13 MED ORDER — ONDANSETRON HCL 4 MG PO TABS: 4.0000 mg | ORAL_TABLET | Freq: Four times a day (QID) | ORAL | Status: DC | PRN

## 2014-04-13 MED ORDER — METHOCARBAMOL 500 MG PO TABS
500.0000 mg | ORAL_TABLET | Freq: Four times a day (QID) | ORAL | Status: DC | PRN
  Administered 2014-04-14 – 2014-04-15 (×3): 500 mg via ORAL
  Filled 2014-04-13 (×3): qty 1

## 2014-04-13 MED ORDER — PROPOFOL 10 MG/ML IV BOLUS
INTRAVENOUS | Status: DC | PRN
  Administered 2014-04-13: 170 mg via INTRAVENOUS

## 2014-04-13 MED ORDER — THROMBIN 5000 UNITS EX SOLR
CUTANEOUS | Status: AC
  Filled 2014-04-13: qty 5000

## 2014-04-13 MED ORDER — METOCLOPRAMIDE HCL 5 MG/ML IJ SOLN: 10.0000 mg | Freq: Once | INTRAMUSCULAR | Status: DC | PRN

## 2014-04-13 MED ORDER — SUCCINYLCHOLINE CHLORIDE 20 MG/ML IJ SOLN
INTRAMUSCULAR | Status: DC | PRN
  Administered 2014-04-13: 100 mg via INTRAVENOUS

## 2014-04-13 MED ORDER — SODIUM CHLORIDE 0.9 % IR SOLN
Freq: Once | Status: DC
  Filled 2014-04-13: qty 1

## 2014-04-13 MED ORDER — BUPIVACAINE HCL (PF) 0.25 % IJ SOLN
INTRAMUSCULAR | Status: DC | PRN
  Administered 2014-04-13: 20 mL

## 2014-04-13 MED ORDER — LACTATED RINGERS IV SOLN
INTRAVENOUS | Status: DC
  Administered 2014-04-13 (×2): via INTRAVENOUS

## 2014-04-13 MED ORDER — GLYCOPYRROLATE 0.2 MG/ML IJ SOLN
INTRAMUSCULAR | Status: DC | PRN
  Administered 2014-04-13: 0.6 mg via INTRAVENOUS
  Administered 2014-04-13: 0.2 mg via INTRAVENOUS

## 2014-04-13 MED ORDER — CEFAZOLIN SODIUM 1-5 GM-% IV SOLN
1.0000 g | Freq: Four times a day (QID) | INTRAVENOUS | Status: AC
  Administered 2014-04-13 (×2): 1 g via INTRAVENOUS
  Filled 2014-04-13 (×2): qty 50

## 2014-04-13 SURGICAL SUPPLY — 76 items
BAG DECANTER FOR FLEXI CONT (MISCELLANEOUS) ×2 IMPLANT
BAG SPEC THK2 15X12 ZIP CLS (MISCELLANEOUS)
BAG ZIPLOCK 12X15 (MISCELLANEOUS) IMPLANT
BANDAGE ELASTIC 4 VELCRO ST LF (GAUZE/BANDAGES/DRESSINGS) ×1 IMPLANT
BANDAGE ELASTIC 6 VELCRO ST LF (GAUZE/BANDAGES/DRESSINGS) ×3 IMPLANT
BANDAGE ESMARK 6X9 LF (GAUZE/BANDAGES/DRESSINGS) ×1 IMPLANT
BLADE SAG 18X100X1.27 (BLADE) ×2 IMPLANT
BLADE SAW SGTL 11.0X1.19X90.0M (BLADE) ×2 IMPLANT
BNDG CMPR 9X6 STRL LF SNTH (GAUZE/BANDAGES/DRESSINGS) ×1
BNDG ESMARK 6X9 LF (GAUZE/BANDAGES/DRESSINGS) ×2
BONE CEMENT GENTAMICIN (Cement) ×4 IMPLANT
CAP KNEE TOTAL 3 SIGMA ×1 IMPLANT
CEMENT BONE GENTAMICIN 40 (Cement) ×2 IMPLANT
CUFF TOURN SGL QUICK 34 (TOURNIQUET CUFF) ×2
CUFF TRNQT CYL 34X4X40X1 (TOURNIQUET CUFF) ×1 IMPLANT
DRAPE EXTREMITY T 121X128X90 (DRAPE) ×2 IMPLANT
DRAPE INCISE IOBAN 66X45 STRL (DRAPES) IMPLANT
DRAPE POUCH INSTRU U-SHP 10X18 (DRAPES) ×2 IMPLANT
DRAPE U-SHAPE 47X51 STRL (DRAPES) ×2 IMPLANT
DRSG AQUACEL AG ADV 3.5X10 (GAUZE/BANDAGES/DRESSINGS) ×2 IMPLANT
DRSG PAD ABDOMINAL 8X10 ST (GAUZE/BANDAGES/DRESSINGS) IMPLANT
DRSG TEGADERM 4X4.75 (GAUZE/BANDAGES/DRESSINGS) ×2 IMPLANT
DURAPREP 26ML APPLICATOR (WOUND CARE) ×2 IMPLANT
ELECT REM PT RETURN 9FT ADLT (ELECTROSURGICAL) ×2
ELECTRODE REM PT RTRN 9FT ADLT (ELECTROSURGICAL) ×1 IMPLANT
EVACUATOR 1/8 PVC DRAIN (DRAIN) ×2 IMPLANT
FACESHIELD WRAPAROUND (MASK) ×10 IMPLANT
FACESHIELD WRAPAROUND OR TEAM (MASK) ×5 IMPLANT
GAUZE SPONGE 2X2 8PLY STRL LF (GAUZE/BANDAGES/DRESSINGS) ×1 IMPLANT
GLOVE BIOGEL PI IND STRL 6.5 (GLOVE) ×1 IMPLANT
GLOVE BIOGEL PI IND STRL 8 (GLOVE) ×1 IMPLANT
GLOVE BIOGEL PI INDICATOR 6.5 (GLOVE) ×1
GLOVE BIOGEL PI INDICATOR 8 (GLOVE) ×1
GLOVE ECLIPSE 8.0 STRL XLNG CF (GLOVE) ×4 IMPLANT
GLOVE SURG SS PI 6.5 STRL IVOR (GLOVE) ×2 IMPLANT
GOWN STRL REUS W/TWL LRG LVL3 (GOWN DISPOSABLE) ×2 IMPLANT
GOWN STRL REUS W/TWL XL LVL3 (GOWN DISPOSABLE) ×2 IMPLANT
HANDPIECE INTERPULSE COAX TIP (DISPOSABLE) ×2
IMMOBILIZER KNEE 20 (SOFTGOODS) ×3 IMPLANT
IMMOBILIZER KNEE 20 THIGH 36 (SOFTGOODS) ×1 IMPLANT
KIT BASIN OR (CUSTOM PROCEDURE TRAY) ×2 IMPLANT
LIQUID BAND (GAUZE/BANDAGES/DRESSINGS) ×2 IMPLANT
MANIFOLD NEPTUNE II (INSTRUMENTS) ×2 IMPLANT
NDL SAFETY ECLIPSE 18X1.5 (NEEDLE) IMPLANT
NEEDLE HYPO 18GX1.5 SHARP (NEEDLE)
NEEDLE HYPO 22GX1.5 SAFETY (NEEDLE) ×4 IMPLANT
NS IRRIG 1000ML POUR BTL (IV SOLUTION) IMPLANT
PACK TOTAL JOINT (CUSTOM PROCEDURE TRAY) ×2 IMPLANT
PADDING CAST COTTON 6X4 STRL (CAST SUPPLIES) ×1 IMPLANT
PEN SKIN MARKING BROAD (MISCELLANEOUS) ×2 IMPLANT
POSITIONER SURGICAL ARM (MISCELLANEOUS) ×2 IMPLANT
SET HNDPC FAN SPRY TIP SCT (DISPOSABLE) ×1 IMPLANT
SET PAD KNEE POSITIONER (MISCELLANEOUS) ×2 IMPLANT
SPONGE GAUZE 2X2 STER 10/PKG (GAUZE/BANDAGES/DRESSINGS) ×1
SPONGE LAP 18X18 X RAY DECT (DISPOSABLE) IMPLANT
SPONGE SURGIFOAM ABS GEL 100 (HEMOSTASIS) ×2 IMPLANT
STAPLER VISISTAT 35W (STAPLE) IMPLANT
SUCTION FRAZIER 12FR DISP (SUCTIONS) ×2 IMPLANT
SUT BONE WAX W31G (SUTURE) ×2 IMPLANT
SUT MNCRL AB 4-0 PS2 18 (SUTURE) ×2 IMPLANT
SUT VIC AB 1 CT1 27 (SUTURE) ×4
SUT VIC AB 1 CT1 27XBRD ANTBC (SUTURE) ×2 IMPLANT
SUT VIC AB 1 CT1 36 (SUTURE) ×1 IMPLANT
SUT VIC AB 2-0 CT1 27 (SUTURE) ×8
SUT VIC AB 2-0 CT1 TAPERPNT 27 (SUTURE) ×3 IMPLANT
SUT VLOC 180 0 24IN GS25 (SUTURE) ×2 IMPLANT
SYR 20CC LL (SYRINGE) ×4 IMPLANT
SYR 50ML LL SCALE MARK (SYRINGE) ×2 IMPLANT
TOWEL OR 17X26 10 PK STRL BLUE (TOWEL DISPOSABLE) ×2 IMPLANT
TOWEL OR NON WOVEN STRL DISP B (DISPOSABLE) IMPLANT
TOWER CARTRIDGE SMART MIX (DISPOSABLE) ×2 IMPLANT
TRAY FOLEY CATH 14FRSI W/METER (CATHETERS) ×2 IMPLANT
TRAY TIB SZ 2 REVISION (Knees) ×1 IMPLANT
WATER STERILE IRR 1500ML POUR (IV SOLUTION) ×2 IMPLANT
WRAP KNEE MAXI GEL POST OP (GAUZE/BANDAGES/DRESSINGS) ×2 IMPLANT
YANKAUER SUCT BULB TIP 10FT TU (MISCELLANEOUS) ×2 IMPLANT

## 2014-04-13 NOTE — Transfer of Care (Signed)
Immediate Anesthesia Transfer of Care Note  Patient: Kelsey Simmons  Procedure(s) Performed: Procedure(s): LEFT TOTAL KNEE ARTHROPLASTY (Left)  Patient Location: PACU  Anesthesia Type:General  Level of Consciousness: sedated  Airway & Oxygen Therapy: Patient Spontanous Breathing and Patient connected to face mask oxygen  Post-op Assessment: Report given to RN and Post -op Vital signs reviewed and stable  Post vital signs: Reviewed and stable  Last Vitals:  Filed Vitals:   04/13/14 0524  BP: 133/82  Pulse: 54  Temp: 36.6 C  Resp: 18    Complications: No apparent anesthesia complications

## 2014-04-13 NOTE — Plan of Care (Signed)
Problem: Consults Goal: Diagnosis- Total Joint Replacement Left total knee     

## 2014-04-13 NOTE — Brief Op Note (Signed)
04/13/2014  9:24 AM  PATIENT:  Kelsey Simmons  54 y.o. female  PRE-OPERATIVE DIAGNOSIS:Primary   OA LEFT KNEE and Morbid Obesity  POST-OPERATIVE DIAGNOSIS:Primary   OA LEFT KNEE and Morbid Obesity PROCEDURE:  Procedure(s): LEFT TOTAL KNEE ARTHROPLASTY (Left) in a Morbid Obese Patient and release of Contractures.  SURGEON:  Surgeon(s) and Role:    * Ranee Gosselinonald Roshad Hack, MD - Primary  PHYSICIAN ASSISTANT:Amber Mineralwellsonstable PA   ASSISTANTS:Amber Two Buttesonstable PA  ANESTHESIA:   general  EBL:  Total I/O In: -  Out: 50 [Urine:50]  BLOOD ADMINISTERED:none  DRAINS: (One) Hemovact drain(s) in the Right Knee with  Suction Open   LOCAL MEDICATIONS USED:  MARCAINE 20cc of 0.25% Plain. Then 20cc of Exparel with 20cc of Normal Saline.     SPECIMEN:  No Specimen  DISPOSITION OF SPECIMEN:  N/A  COUNTS:  YES  TOURNIQUET:  * Missing tourniquet times found for documented tourniquets in log:  [id number] *  DICTATION: .Other Dictation: Dictation Number [id number]  PLAN OF CARE: Admit to inpatient   PATIENT DISPOSITION:  Stable in OR   Delay start of Pharmacological VTE agent (>24hrs) due to surgical blood loss or risk of bleeding: yes

## 2014-04-13 NOTE — Interval H&P Note (Signed)
History and Physical Interval Note:  04/13/2014 6:59 AM  Kelsey Simmons  has presented today for surgery, with the diagnosis of OA LEFT KNEE  The various methods of treatment have been discussed with the patient and family. After consideration of risks, benefits and other options for treatment, the patient has consented to  Procedure(s): LEFT TOTAL KNEE ARTHROPLASTY (Left) as a surgical intervention .  The patient's history has been reviewed, patient examined, no change in status, stable for surgery.  I have reviewed the patient's chart and labs.  Questions were answered to the patient's satisfaction.     Odaliz Mcqueary A

## 2014-04-13 NOTE — Progress Notes (Signed)
Patient unable to provide urine for U/A. Imitrex in chart for patient post op as requested.

## 2014-04-13 NOTE — Anesthesia Preprocedure Evaluation (Signed)
Anesthesia Evaluation  Patient identified by MRN, date of birth, ID band Patient awake    Reviewed: Allergy & Precautions, NPO status , Patient's Chart, lab work & pertinent test results  Airway Mallampati: II  TM Distance: >3 FB Neck ROM: Full    Dental  (+) Teeth Intact, Caps,    Pulmonary neg pulmonary ROS, sleep apnea ,  No longer uses CPAP after weight loss breath sounds clear to auscultation  Pulmonary exam normal       Cardiovascular hypertension, Rhythm:Regular Rate:Normal     Neuro/Psych  Headaches,  Neuromuscular disease negative psych ROS   GI/Hepatic Neg liver ROS, GERD-  Medicated and Controlled,  Endo/Other  Morbid obesity  Renal/GU negative Renal ROS  negative genitourinary   Musculoskeletal  (+) Arthritis -, Osteoarthritis,  Fibromyalgia -  Abdominal (+) + obese,   Peds  Hematology negative hematology ROS (+)   Anesthesia Other Findings   Reproductive/Obstetrics negative OB ROS                             Anesthesia Physical Anesthesia Plan  ASA: III  Anesthesia Plan: General   Post-op Pain Management:    Induction: Intravenous  Airway Management Planned: Oral ETT  Additional Equipment:   Intra-op Plan:   Post-operative Plan: Extubation in OR  Informed Consent: I have reviewed the patients History and Physical, chart, labs and discussed the procedure including the risks, benefits and alternatives for the proposed anesthesia with the patient or authorized representative who has indicated his/her understanding and acceptance.   Dental advisory given  Plan Discussed with: CRNA, Surgeon and Anesthesiologist  Anesthesia Plan Comments:         Anesthesia Quick Evaluation

## 2014-04-13 NOTE — Op Note (Signed)
Kelsey Simmons, Kelsey Simmons NO.:  [id number]  MEDICAL RECORD NO.:  [id number]  LOCATION:  1602                         FACILITY:  [facility]  PHYSICIAN:  Kelsey Simmons. Kelsey Simmons, M.D.DATE OF BIRTH:  1960/10/24  DATE OF PROCEDURE:  04/13/2014 DATE OF DISCHARGE:                              OPERATIVE REPORT   SURGEON:  Kelsey Simmons. Kelsey Simmons, M.D.  OPERATIVE ASSISTANT:  Kelsey Ped, PA.  PREOPERATIVE DIAGNOSES: 1. Morbid obesity. 2. Bone-on-bone primary osteoarthritis of the left knee.  POSTOPERATIVE DIAGNOSES: 1. Morbid obesity. 2. Bone-on-bone primary osteoarthritis of the left knee.  OPERATION:  Left total knee arthroplasty utilizing the DePuy system, all three components were cemented and gentamicin was used in the cement. The size was used, the patella was a size 35 with 3 pegs.  The femoral component was a size 2.5, left posterior cruciate sacrificing femoral component, the tibial tray was a size 2, the insert was a size 2.5 and 20-mm thickness rotating platform.  DESCRIPTION OF PROCEDURE:  Under general anesthesia, routine orthopedic prepping and draping of the left lower extremity was carried out. Appropriate time-out was first carried out.  I also marked the appropriate left leg in the holding area.  At this time, the leg was exsanguinated and Esmarch.  Tourniquet was elevated to 350 mmHg.  The knee was placed in a DeMayo knee holder and flexed.  Anterior approach to the knee was carried out.  The patient did have 2 g of IV Ancef.  I then carried out a median parapatellar incision and reflected the patella laterally, did medial and lateral meniscectomies and excised the anterior and posterior cruciate ligaments.  At that particular time, I then made my initial drill hole in the intercondylar notch.  The canal finder was inserted.  I then thoroughly irrigated out the canal.  I then attached my next jig and removed 12-mm thickness off the distal femur. She had some  significant contracture.  At this time, we then inserted our next jig and did anterior, posterior and chamfering cuts that we measured for a 2.5-mm left femoral component.  Following that, we then removed the appropriate amount of bone from the tibial plateau.  The measurement was taken for a 6-mm cut off the medial tibial plateau.  At this time, we then inserted our lamina spreaders and noted at that point that diffuse spurs posteriorly medially and there was a removal of the osteotome and we protected the posterior compartment with a wide Crego. At that time, we then went through various trials with the spacer blocks.  We then continued to prepare the tibia for the revision tray, size 2.  After that, we cut our notch, cut out the femur and the distal femur.  We then went through trials and went all way up to a 20-mm thickness insert.  On the other side, we used 17.5.  At this time, the trial components were left in and we did a resurfacing procedure on the patella.  Three drill holes were made in the patella for size 35 patella.  All trial components were removed.  I thoroughly irrigated out the area.  Cemented all three components simultaneously with gentamicin in  the cement.  All loose pieces of cement then were removed.  We looked posteriorly, removed those and water picked the rest of the area posteriorly to make sure all the fragments were removed.  We then went through trials again and finally selected 20-mm thickness polyethylene insert.  The knee was reduced, we had excellent function.  We then closed the wound layers in usual fashion over Hemovac drain.  We injected 20 mL of 0.25% Marcaine plain, following that was 20 mL of Exparel with 20 mL of normal saline.  Sterile dressings were applied.          ______________________________ Kelsey Lynchonald A. Kelsey HillockGioffre, M.D.     RAG/MEDQ  D:  04/13/2014  T:  04/13/2014  Job:  [id number]

## 2014-04-13 NOTE — Anesthesia Postprocedure Evaluation (Signed)
  Anesthesia Post-op Note  Patient: Kelsey LemonsDorothy R Simmons  Procedure(s) Performed: Procedure(s): LEFT TOTAL KNEE ARTHROPLASTY (Left)  Patient Location: PACU  Anesthesia Type:General  Level of Consciousness: awake, alert  and oriented  Airway and Oxygen Therapy: Patient Spontanous Breathing and Patient connected to nasal cannula oxygen  Post-op Pain: mild  Post-op Assessment: Post-op Vital signs reviewed, Patient's Cardiovascular Status Stable, Respiratory Function Stable, Patent Airway, No signs of Nausea or vomiting and Pain level controlled  Post-op Vital Signs: Reviewed and stable  Last Vitals:  Filed Vitals:   04/13/14 1300  BP: 120/65  Pulse: 84  Temp: 36.3 C  Resp: 12    Complications: No apparent anesthesia complications

## 2014-04-13 NOTE — Care Management Note (Signed)
    Page 1 of 1   04/13/2014     3:55:37 PM CARE MANAGEMENT NOTE 04/13/2014  Patient:  Kelsey Simmons, Kelsey Simmons   Account Number:  [id number]  Date Initiated:  04/13/2014  Documentation initiated by:  [facility]  Subjective/Objective Assessment:   adm: LEFT TOTAL KNEE ARTHROPLASTY (Left)     Action/Plan:   discharge planning   Anticipated DC Date:  04/14/2014   Anticipated DC Plan:  [place]  CM consult      [facility] Choice  HOME HEALTH   Choice offered to / List presented to:  C-1 Patient        [facility] arranged  HH-2 PT      [place]   Status of service:  Completed, signed off Medicare Important Message given?   (If response is "NO", the following Medicare IM given date fields will be blank) Date Medicare IM given:   Medicare IM given by:   Date Additional Medicare IM given:   Additional Medicare IM given by:    Discharge Disposition:  [place]  Per UR Regulation:    If discussed at Long Length of Stay Meetings, dates discussed:    Comments:  04/13/14 15:50 Cm met with pt in room to offer choice of home health agency.  Pt chooses [facility] for HHPT. Pt has rolling walker and 3n1 at home from a previous surgery. Address and contact information verified by pt. Referral emailed to [employer], Tim. NO other CM needs were communicated. Mariane Masters, BSN, CM [phone].

## 2014-04-14 LAB — CBC
HCT: 36.6 % (ref 36.0–46.0)
Hemoglobin: 11.1 g/dL — ABNORMAL LOW (ref 12.0–15.0)
MCH: 25.2 pg — ABNORMAL LOW (ref 26.0–34.0)
MCHC: 30.3 g/dL (ref 30.0–36.0)
MCV: 83.2 fL (ref 78.0–100.0)
PLATELETS: 260 10*3/uL (ref 150–400)
RBC: 4.4 MIL/uL (ref 3.87–5.11)
RDW: 15.5 % (ref 11.5–15.5)
WBC: 13.7 10*3/uL — ABNORMAL HIGH (ref 4.0–10.5)

## 2014-04-14 LAB — BASIC METABOLIC PANEL
ANION GAP: 9 (ref 5–15)
BUN: 14 mg/dL (ref 6–23)
CALCIUM: 9.1 mg/dL (ref 8.4–10.5)
CHLORIDE: 100 mmol/L (ref 96–112)
CO2: 29 mmol/L (ref 19–32)
Creatinine, Ser: 0.79 mg/dL (ref 0.50–1.10)
GLUCOSE: 130 mg/dL — AB (ref 70–99)
Potassium: 4.2 mmol/L (ref 3.5–5.1)
Sodium: 138 mmol/L (ref 135–145)

## 2014-04-14 MED ORDER — METHOCARBAMOL 500 MG PO TABS: 500.0000 mg | ORAL_TABLET | Freq: Four times a day (QID) | ORAL | Status: DC | PRN

## 2014-04-14 MED ORDER — HYDROMORPHONE HCL 2 MG PO TABS: 2.0000 mg | ORAL_TABLET | ORAL | Status: DC | PRN

## 2014-04-14 MED ORDER — DOCUSATE SODIUM 100 MG PO CAPS: 100.0000 mg | ORAL_CAPSULE | Freq: Two times a day (BID) | ORAL | Status: DC

## 2014-04-14 MED ORDER — RIVAROXABAN 10 MG PO TABS: 10.0000 mg | ORAL_TABLET | Freq: Every day | ORAL | Status: DC

## 2014-04-14 MED ORDER — HYDROMORPHONE HCL 2 MG PO TABS
2.0000 mg | ORAL_TABLET | ORAL | Status: DC | PRN
Start: 2014-04-14 — End: 2014-04-15
  Administered 2014-04-14: 4 mg via ORAL
  Administered 2014-04-14: 2 mg via ORAL
  Administered 2014-04-14: 4 mg via ORAL
  Administered 2014-04-14: 2 mg via ORAL
  Administered 2014-04-15: 4 mg via ORAL
  Filled 2014-04-14: qty 1
  Filled 2014-04-14: qty 2
  Filled 2014-04-14: qty 1
  Filled 2014-04-14 (×2): qty 2

## 2014-04-14 MED ORDER — FERROUS SULFATE 325 (65 FE) MG PO TABS: 325.0000 mg | ORAL_TABLET | Freq: Three times a day (TID) | ORAL | Status: DC

## 2014-04-14 NOTE — Discharge Instructions (Addendum)
INSTRUCTIONS AFTER JOINT REPLACEMENT  ° °Remove items at home which could result in a fall. This includes throw rugs or furniture in walking pathways °ICE to the affected joint every three hours while awake for 30 minutes at a time, for at least the first 3-5 days, and then as needed for pain and swelling.  Continue to use ice for pain and swelling. You may notice swelling that will progress down to the foot and ankle.  This is normal after surgery.  Elevate your leg when you are not up walking on it.   °Continue to use the breathing machine you got in the hospital (incentive spirometer) which will help keep your temperature down.  It is common for your temperature to cycle up and down following surgery, especially at night when you are not up moving around and exerting yourself.  The breathing machine keeps your lungs expanded and your temperature down. °DIET:  As you were doing prior to hospitalization, we recommend a well-balanced diet. °DRESSING / WOUND CARE / SHOWERING °Keep the surgical dressing until follow up.  The dressing is water proof, so you can shower without any extra covering.  IF THE DRESSING FALLS OFF or the wound gets wet inside, change the dressing with sterile gauze.  Please use good hand washing techniques before changing the dressing.  Do not use any lotions or creams on the incision until instructed by your surgeon.   ° °ACTIVITY °Increase activity slowly as tolerated, but follow the weight bearing instructions below.   °No driving for 6 weeks or until further direction given by your physician.  You cannot drive while taking narcotics.  °No lifting or carrying greater than 10 lbs. until further directed by your surgeon. °Avoid periods of inactivity such as sitting longer than an hour when not asleep. This helps prevent blood clots.  °You may return to work once you are authorized by your doctor.  °WEIGHT BEARING  °Weight bearing as tolerated with assist device (walker, cane, etc) as directed,  use it as long as suggested by your surgeon or therapist, typically at least 4-6 weeks. °EXERCISES °Results after joint replacement surgery are often greatly improved when you follow the exercise, range of motion and muscle strengthening exercises prescribed by your doctor. Safety measures are also important to protect the joint from further injury. Any time any of these exercises cause you to have increased pain or swelling, decrease what you are doing until you are comfortable again and then slowly increase them. If you have problems or questions, call your caregiver or physical therapist for advice.  °Rehabilitation is important following a joint replacement. After just a few days of immobilization, the muscles of the leg can become weakened and shrink (atrophy).  These exercises are designed to build up the tone and strength of the thigh and leg muscles and to improve motion. Often times heat used for twenty to thirty minutes before working out will loosen up your tissues and help with improving the range of motion but do not use heat for the first two weeks following surgery (sometimes heat can increase post-operative swelling).  ° °These exercises can be done on a training (exercise) mat, on the floor, on a table or on a bed. Use whatever works the best and is most comfortable for you.    Use music or television while you are exercising so that the exercises are a pleasant break in your day. This will make your life better with the exercises acting as a   break in your routine that you can look forward to.   Perform all exercises about fifteen times, three times per day or as directed.  You should exercise both the operative leg and the other leg as well.  °Exercises include: °  °Quad Sets - Tighten up the muscle on the front of the thigh (Quad) and hold for 5-10 seconds.   °Straight Leg Raises - With your knee straight (if you were given a brace, keep it on), lift the leg to 60 degrees, hold for 3 seconds, and  slowly lower the leg.  Perform this exercise against resistance later as your leg gets stronger.  °Leg Slides: Lying on your back, slowly slide your foot toward your buttocks, bending your knee up off the floor (only go as far as is comfortable). Then slowly slide your foot back down until your leg is flat on the floor again.  °Angel Wings: Lying on your back spread your legs to the side as far apart as you can without causing discomfort.  °Hamstring Strength:  Lying on your back, push your heel against the floor with your leg straight by tightening up the muscles of your buttocks.  Repeat, but this time bend your knee to a comfortable angle, and push your heel against the floor.  You may put a pillow under the heel to make it more comfortable if necessary.  °A rehabilitation program following joint replacement surgery can speed recovery and prevent re-injury in the future due to weakened muscles. Contact your doctor or a physical therapist for more information on knee rehabilitation.  ° ° °CONSTIPATION °Constipation is defined medically as fewer than three stools per week and severe constipation as less than one stool per week.  Even if you have a regular bowel pattern at home, your normal regimen is likely to be disrupted due to multiple reasons following surgery.  Combination of anesthesia, postoperative narcotics, change in appetite and fluid intake all can affect your bowels.  ° °YOU MUST use at least one of the following options; they are listed in order of increasing strength to get the job done.  They are all available over the counter, and you may need to use some, POSSIBLY even all of these options:   °Drink plenty of fluids (prune juice may be helpful) and high fiber foods °Colace 100 mg by mouth twice a day  °Senokot for constipation as directed and as needed Dulcolax (bisacodyl), take with full glass of water  °Miralax (polyethylene glycol) once or twice a day as needed. °If you have tried all these  things and are unable to have a bowel movement in the first 3-4 days after surgery call either your surgeon or your primary doctor.   °If you experience loose stools or diarrhea, hold the medications until you stool forms back up.  If your symptoms do not get better within 1 week or if they get worse, check with your doctor.  If you experience "the worst abdominal pain ever" or develop nausea or vomiting, please contact the office immediately for further recommendations for treatment. °ITCHING:  If you experience itching with your medications, try taking only a single pain pill, or even half a pain pill at a time.  You can also use Benadryl over the counter for itching or also to help with sleep.  °TED HOSE STOCKINGS:  Use stockings on both legs until for at least 2 weeks or as directed by physician office. They may be removed at night for sleeping. °  MEDICATIONS:  See your medication summary on the “After Visit Summary” that nursing will review with you.  You may have some home medications which will be placed on hold until you complete the course of blood thinner medication.  It is important for you to complete the blood thinner medication as prescribed. °PRECAUTIONS:  If you experience chest pain or shortness of breath - call 911 immediately for transfer to the hospital emergency department.  °If you develop a fever greater that 101 F, purulent drainage from wound, increased redness or drainage from wound, foul odor from the wound/dressing, or calf pain - CONTACT YOUR SURGEON.                                   °FOLLOW-UP APPOINTMENTS:  If you do not already have a post-op appointment, please call the office for an appointment to be seen by your surgeon.  Guidelines for how soon to be seen are listed in your “After Visit Summary”, but are typically between 1-4 weeks after surgery. °OTHER INSTRUCTIONS:  °Knee Replacement:  Do not place pillow under knee, focus on keeping the knee straight while resting.  °MAKE SURE  YOU:  °Understand these instructions.  °Get help right away if you are not doing well or get worse.  °Thank you for letting us be a part of your medical care team.  It is a privilege we respect greatly.  We hope these instructions will help you stay on track for a fast and full recovery!  ° °========================================================================================================================================== ° °Information on my medicine - XARELTO® (Rivaroxaban) ° °This medication education was reviewed with me or my healthcare representative as part of my discharge preparation.  The pharmacist that spoke with me during my hospital stay was:  Silvina Hackleman K, RPH ° °Why was Xarelto® prescribed for you? °Xarelto® was prescribed for you to reduce the risk of blood clots forming after orthopedic surgery. The medical term for these abnormal blood clots is venous thromboembolism (VTE). ° °What do you need to know about xarelto® ? °Take your Xarelto® ONCE DAILY at the same time every day. °You may take it either with or without food. ° °If you have difficulty swallowing the tablet whole, you may crush it and mix in applesauce just prior to taking your dose. ° °Take Xarelto® exactly as prescribed by your doctor and DO NOT stop taking Xarelto® without talking to the doctor who prescribed the medication.  Stopping without other VTE prevention medication to take the place of Xarelto® may increase your risk of developing a clot. ° °After discharge, you should have regular check-up appointments with your healthcare provider that is prescribing your Xarelto®.   ° °What do you do if you miss a dose? °If you miss a dose, take it as soon as you remember on the same day then continue your regularly scheduled once daily regimen the next day. Do not take two doses of Xarelto® on the same day.  ° °Important Safety Information °A possible side effect of Xarelto® is bleeding. You should call your healthcare provider  right away if you experience any of the following: °? Bleeding from an injury or your nose that does not stop. °? Unusual colored urine (red or dark brown) or unusual colored stools (red or black). °? Unusual bruising for unknown reasons. °? A serious fall or if you hit your head (even if there is no bleeding). ° °Some medicines may   interact with Xarelto® and might increase your risk of bleeding while on Xarelto®. To help avoid this, consult your healthcare provider or pharmacist prior to using any new prescription or non-prescription medications, including herbals, vitamins, non-steroidal anti-inflammatory drugs (NSAIDs) and supplements. ° °This website has more information on Xarelto®: www.xarelto.com. ° ° °

## 2014-04-14 NOTE — Progress Notes (Signed)
Physical Therapy Treatment Patient Details Name: Thomos LemonsDorothy R Magno MRN: [id number] DOB: 07-25-1960 Today's Date: 04/14/2014    History of Present Illness LTKA    PT Comments    Progressing well.   Follow Up Recommendations  Home health PT;Supervision/Assistance - 24 hour     Equipment Recommendations       Recommendations for Other Services       Precautions / Restrictions      Mobility  Bed Mobility   Bed Mobility: Sit to Supine       Sit to supine: Supervision      Transfers   Equipment used: Rolling walker (2 wheeled) Transfers: Sit to/from Stand Sit to Stand: Supervision         General transfer comment: cues for hand placement  Ambulation/Gait Ambulation/Gait assistance: Min guard Ambulation Distance (Feet): 60 Feet Assistive device: Rolling walker (2 wheeled) Gait Pattern/deviations: Step-through pattern;Antalgic     General Gait Details: cues for sequence, to roll RW./ posture   Stairs            Wheelchair Mobility    Modified Rankin (Stroke Patients Only)       Balance                                    Cognition                            Exercises Total Joint Exercises Ankle Circles/Pumps: AROM;Both;10 reps;Supine Quad Sets: AROM;Both;10 reps;Supine Heel Slides: AAROM;Left;10 reps;Supine Hip ABduction/ADduction: AAROM;Left;10 reps;Supine Straight Leg Raises: AAROM;Left;10 reps;Supine    General Comments        Pertinent Vitals/Pain Pain Score: 4  Pain Location: L knee Pain Descriptors / Indicators: Aching;Sore    Home Living                      Prior Function            PT Goals (current goals can now be found in the care plan section) Progress towards PT goals: Progressing toward goals    Frequency  7X/week    PT Plan Current plan remains appropriate    Co-evaluation             End of Session Equipment Utilized During Treatment: Left knee  immobilizer Activity Tolerance: Patient tolerated treatment well Patient left: in bed;with call bell/phone within reach;with family/visitor present     Time: [id number] PT Time Calculation (min) (ACUTE ONLY): 31 min  Charges:  $Gait Training: 8-22 mins $Therapeutic Exercise: 8-22 mins                    G Codes:      Rada HayHill, Muriel Hannold Elizabeth 04/14/2014, 5:53 PM

## 2014-04-14 NOTE — Progress Notes (Signed)
OT Cancellation Note  Patient Details Name: Kelsey LemonsDorothy R Simmons MRN: [id number] DOB: 03/11/1960   Cancelled Treatment:    Reason Eval/Treat Not Completed: Other (comment).  Pt had other knee done last year.  She feels comfortable with ADLs and bathroom transfers.  Screen only.  Dearia Wilmouth 04/14/2014, 9:55 AM  Marica OtterMaryellen Nishita Isaacks, OTR/L [phone] 04/14/2014

## 2014-04-14 NOTE — Evaluation (Signed)
Physical Therapy Evaluation Patient Details Name: Kelsey LemonsDorothy R Simmons MRN: [id number] DOB: [birth year] Today's Date: 04/14/2014   History of Present Illness  LTKA  Clinical Impression  Patient tolerated very well. Will benefit from PT to address problems listed in note below.    Follow Up Recommendations Home health PT;Supervision/Assistance - 24 hour    Equipment Recommendations  None recommended by PT    Recommendations for Other Services       Precautions / Restrictions Precautions Precautions: Knee Required Braces or Orthoses: Knee Immobilizer - Left Knee Immobilizer - Left: Discontinue once straight leg raise with < 10 degree lag      Mobility  Bed Mobility Overal bed mobility: Needs Assistance Bed Mobility: Supine to Sit     Supine to sit: Min guard;HOB elevated        Transfers Overall transfer level: Needs assistance Equipment used: Rolling walker (2 wheeled) Transfers: Sit to/from Stand Sit to Stand: Min assist         General transfer comment: cues for hand placement  Ambulation/Gait Ambulation/Gait assistance: Min assist Ambulation Distance (Feet): 50 Feet Assistive device: Rolling walker (2 wheeled) Gait Pattern/deviations: Step-to pattern;Step-through pattern;Decreased step length - left;Decreased stance time - left     General Gait Details: cues for sequence, to roll RW./ posture  Stairs            Wheelchair Mobility    Modified Rankin (Stroke Patients Only)       Balance                                             Pertinent Vitals/Pain Pain Assessment: 0-10 Pain Score: 4  Pain Location: L knee Pain Descriptors / Indicators: Aching;Tender Pain Intervention(s): Ice applied;Monitored during session;Patient requesting pain meds-RN notified    Home Living Family/patient expects to be discharged to:: Private residence Living Arrangements: Spouse/significant other Available Help at Discharge: Family Type of  Home: House Home Access: Stairs to enter Entrance Stairs-Rails: None Entrance Stairs-Number of Steps: 1 Home Layout: Able to live on main level with bedroom/bathroom Home Equipment: Walker - 2 wheels;Walker - 4 wheels;Toilet riser;Shower seat      Prior Function Level of Independence: Independent               Hand Dominance        Extremity/Trunk Assessment               Lower Extremity Assessment: LLE deficits/detail   LLE Deficits / Details: SLR +, knee flexion 50 degrees.     Communication   Communication: No difficulties  Cognition                            General Comments      Exercises Total Joint Exercises Ankle Circles/Pumps: AROM;Both;10 reps;Supine Quad Sets: AROM;Both;10 reps;Supine Short Arc Quad: AAROM;Left;10 reps;Supine Heel Slides: AAROM;Left;10 reps;Supine Hip ABduction/ADduction: AAROM;Left;10 reps;Supine Straight Leg Raises: AAROM;Left;10 reps;Supine      Assessment/Plan    PT Assessment Patient needs continued PT services  PT Diagnosis Difficulty walking;Acute pain   PT Problem List Decreased strength;Decreased activity tolerance;Decreased mobility;Pain  PT Treatment Interventions DME instruction;Gait training;Stair training;Functional mobility training;Therapeutic activities;Patient/family education   PT Goals (Current goals can be found in the Care Plan section) Acute Rehab PT Goals Patient Stated Goal: to walk without pain PT Goal Formulation:  With patient Time For Goal Achievement: 04/17/14 Potential to Achieve Goals: Good    Frequency 7X/week   Barriers to discharge        Co-evaluation               End of Session Equipment Utilized During Treatment: Left knee immobilizer Activity Tolerance: Patient tolerated treatment well Patient left: in chair;with call bell/phone within reach Nurse Communication: Mobility status         Time: 0820-0918 PT Time Calculation (min) (ACUTE ONLY): 58  min   Charges:     PT Treatments $Gait Training: 8-22 mins $Therapeutic Exercise: 8-22 mins $Self Care/Home Management: 8-22   PT G Codes:        Rada Hay 04/14/2014, 10:11 AM Blanchard Kelch PT [phone]

## 2014-04-14 NOTE — Progress Notes (Signed)
Utilization review completed.  

## 2014-04-14 NOTE — Progress Notes (Signed)
Subjective: 1 Day Post-Op Procedure(s) (LRB): LEFT TOTAL KNEE ARTHROPLASTY (Left) Patient reports pain as 3 on 0-10 scale.Doing very well today.Hemovac DCd    Objective: Vital signs in last 24 hours: Temp:  [97.3 F (36.3 C)-98.4 F (36.9 C)] 97.7 F (36.5 C) (03/24 0604) Pulse Rate:  [52-84] 52 (03/24 0604) Resp:  [10-16] 16 (03/24 0604) BP: (105-146)/(55-93) 115/55 mmHg (03/24 0604) SpO2:  [97 %-100 %] 100 % (03/24 0604) Weight:  [112.946 kg (249 lb)] 112.946 kg (249 lb) (03/23 1315)  Intake/Output from previous day: 03/23 0701 - 03/24 0700 In: 4688.3 [P.O.:920; I.V.:3618.3; IV Piggyback:150] Out: 1535 [Urine:1225; Drains:310] Intake/Output this shift:     Recent Labs  04/13/14 0615 04/14/14 0433  HGB 12.0 11.1*    Recent Labs  04/13/14 0615 04/14/14 0433  WBC 6.8 13.7*  RBC 4.73 4.40  HCT 39.3 36.6  PLT 274 260    Recent Labs  04/13/14 0615 04/14/14 0433  NA 141 138  K 3.9 4.2  CL 101 100  CO2 30 29  BUN 19 14  CREATININE 0.89 0.79  GLUCOSE 100* 130*  CALCIUM 9.0 9.1    Recent Labs  04/13/14 0615  INR 1.00    Dorsiflexion/Plantar flexion intact  Assessment/Plan: 1 Day Post-Op Procedure(s) (LRB): LEFT TOTAL KNEE ARTHROPLASTY (Left) Up with therapy discontinued Tomorrow.  Desiraye Rolfson A 04/14/2014, 7:22 AM

## 2014-04-15 LAB — BASIC METABOLIC PANEL
ANION GAP: 9 (ref 5–15)
BUN: 19 mg/dL (ref 6–23)
CO2: 30 mmol/L (ref 19–32)
CREATININE: 1.01 mg/dL (ref 0.50–1.10)
Calcium: 8.8 mg/dL (ref 8.4–10.5)
Chloride: 98 mmol/L (ref 96–112)
GFR calc non Af Amer: 63 mL/min — ABNORMAL LOW (ref >90)
GFR, EST AFRICAN AMERICAN: 73 mL/min — AB (ref >90)
Glucose, Bld: 110 mg/dL — ABNORMAL HIGH (ref 70–99)
Potassium: 3.8 mmol/L (ref 3.5–5.1)
Sodium: 137 mmol/L (ref 135–145)

## 2014-04-15 LAB — CBC
HEMATOCRIT: 33.5 % — AB (ref 36.0–46.0)
HEMOGLOBIN: 10.3 g/dL — AB (ref 12.0–15.0)
MCH: 25.4 pg — ABNORMAL LOW (ref 26.0–34.0)
MCHC: 30.7 g/dL (ref 30.0–36.0)
MCV: 82.7 fL (ref 78.0–100.0)
Platelets: 248 10*3/uL (ref 150–400)
RBC: 4.05 MIL/uL (ref 3.87–5.11)
RDW: 15.7 % — AB (ref 11.5–15.5)
WBC: 10.9 10*3/uL — AB (ref 4.0–10.5)

## 2014-04-15 NOTE — Progress Notes (Signed)
Physical Therapy Treatment Patient Details Name: Kelsey LemonsDorothy R Simmons MRN: [id number] DOB: February 26, 1960 Today's Date: 04/15/2014    History of Present Illness LTKA    PT Comments    Patient improving with strength and ROM L knee. DC today  Follow Up Recommendations  Home health PT;Supervision/Assistance - 24 hour     Equipment Recommendations       Recommendations for Other Services       Precautions / Restrictions Precautions Precautions: Knee Required Braces or Orthoses: Knee Immobilizer - Left Knee Immobilizer - Left: Discontinue once straight leg raise with < 10 degree lag Restrictions LLE Weight Bearing: Weight bearing as tolerated    Mobility  Bed Mobility           Sit to supine: Modified independent (Device/Increase time)      Transfers   Equipment used: Rolling walker (2 wheeled) Transfers: Sit to/from Stand Sit to Stand: Supervision         General transfer comment: cues for hand placement  Ambulation/Gait Ambulation/Gait assistance: Supervision Ambulation Distance (Feet): 80 Feet Assistive device: Rolling walker (2 wheeled) Gait Pattern/deviations: Step-to pattern;Step-through pattern;Antalgic     General Gait Details: cues for sequence, to roll RW./ posture   Stairs Stairs: Yes Stairs assistance: Min assist Stair Management: No rails;Backwards Number of Stairs: 1 General stair comments: cues for sequence and safety  Wheelchair Mobility    Modified Rankin (Stroke Patients Only)       Balance                                    Cognition                            Exercises Total Joint Exercises Ankle Circles/Pumps: AROM;Both;10 reps;Supine Quad Sets: AROM;Both;10 reps;Supine Short Arc Quad: Left;10 reps;Supine;AROM Heel Slides: Left;10 reps;Supine;AROM Hip ABduction/ADduction: Left;10 reps;Supine;AROM Straight Leg Raises: AAROM;Left;10 reps;Supine Goniometric ROM: 5-50 l knee    General Comments         Pertinent Vitals/Pain Pain Score: 6  Pain Location: L knee Pain Descriptors / Indicators: Aching Pain Intervention(s): Limited activity within patient's tolerance;Repositioned;Ice applied    Home Living                      Prior Function            PT Goals (current goals can now be found in the care plan section) Progress towards PT goals: Progressing toward goals    Frequency       PT Plan Current plan remains appropriate    Co-evaluation             End of Session Equipment Utilized During Treatment: Left knee immobilizer Activity Tolerance: Patient tolerated treatment well Patient left: in bed;with call bell/phone within reach;with family/visitor present     Time: [id number] PT Time Calculation (min) (ACUTE ONLY): 32 min  Charges:  $Gait Training: 8-22 mins $Therapeutic Exercise: 8-22 mins                    G Codes:      Rada HayHill, Angelee Bahr Elizabeth 04/15/2014, 12:53 PM

## 2014-04-15 NOTE — Progress Notes (Signed)
     Subjective: 2 Days Post-Op Procedure(s) (LRB): LEFT TOTAL KNEE ARTHROPLASTY (Left)   Patient reports pain as mild, pain controlled. No events throughout the night. Has been through this last year on the other knee, and looking forward to progressing with this knee.  Ready to be discharged home.  Objective:   VITALS:   Filed Vitals:   04/15/14 0740  BP: 107/56  Pulse: 55  Temp: 97.7 F (36.5 C)   Resp: 20    Dorsiflexion/Plantar flexion intact Incision: dressing C/D/I No cellulitis present Compartment soft  LABS  Recent Labs  04/13/14 0615 04/14/14 0433 04/15/14 0407  HGB 12.0 11.1* 10.3*  HCT 39.3 36.6 33.5*  WBC 6.8 13.7* 10.9*  PLT 274 260 248     Recent Labs  04/13/14 0615 04/14/14 0433 04/15/14 0407  NA 141 138 137  K 3.9 4.2 3.8  BUN 19 14 19   CREATININE 0.89 0.79 1.01  GLUCOSE 100* 130* 110*     Assessment/Plan: 2 Days Post-Op Procedure(s) (LRB): LEFT TOTAL KNEE ARTHROPLASTY (Left) Up with therapy Discharge home with home health  [facility]. Follow up with Dr. Darrelyn HillockGioffre in 2 weeks.  Contact information:  [facility] [address], [address] [place] [id number] [phone]        Kelsey AuerbachMatthew S. Laritza Simmons   PAC  04/15/2014, 8:36 AM

## 2014-04-19 NOTE — Discharge Summary (Signed)
Physician Discharge Summary   Patient ID: Kelsey Simmons MRN: [id number] DOB/AGE: 53-Dec-1962 53 y.o.  Admit date: 04/13/2014 Discharge date: 04/15/2014  Primary Diagnosis: Primary osteoarthritis, left knee  Admission Diagnoses:  Past Medical History  Diagnosis Date  . Hypertension   . Headache(784.0)     HX MIGRAINES  . Arthritis   . Fibromyalgia   . Sleep apnea     HAS NOT USED C PAP SINCE 2007 DUE TO WT LOSS    Discharge Diagnoses:   Active Problems:   History of total knee arthroplasty  Estimated body mass index is 48.63 kg/(m^2) as calculated from the following:   Height as of this encounter: 5' (1.524 m).   Weight as of this encounter: 112.946 kg (249 lb).  Procedure:  Procedure(s) (LRB): LEFT TOTAL KNEE ARTHROPLASTY (Left)   Consults: None  HPI: Kelsey Simmons, 54 y.o. female, has a history of pain and functional disability in the left knee due to arthritis and has failed non-surgical conservative treatments for greater than 12 weeks to includeNSAID's and/or analgesics, corticosteriod injections, viscosupplementation injections, use of assistive devices and activity modification. Onset of symptoms was gradual, starting 5 years ago with gradually worsening course since that time. The patient noted no past surgery on the left knee(s). Patient currently rates pain in the left knee(s) at 7 out of 10 with activity. Patient has night pain, worsening of pain with activity and weight bearing, pain that interferes with activities of daily living, pain with passive range of motion, crepitus and joint swelling. Patient has evidence of periarticular osteophytes and joint space narrowing by imaging studies. There is no active infection. Laboratory Data: Admission on 04/13/2014, Discharged on 04/15/2014  Component Date Value Ref Range Status  . aPTT 04/13/2014 28  24 - 37 seconds Final  . WBC 04/13/2014 6.8  4.0 - 10.5 K/uL Final  . RBC 04/13/2014 4.73  3.87 - 5.11 MIL/uL Final    . Hemoglobin 04/13/2014 12.0  12.0 - 15.0 g/dL Final  . HCT 04/13/2014 39.3  36.0 - 46.0 % Final  . MCV 04/13/2014 83.1  78.0 - 100.0 fL Final  . MCH 04/13/2014 25.4* 26.0 - 34.0 pg Final  . MCHC 04/13/2014 30.5  30.0 - 36.0 g/dL Final  . RDW 04/13/2014 15.7* 11.5 - 15.5 % Final  . Platelets 04/13/2014 274  150 - 400 K/uL Final  . Neutrophils Relative % 04/13/2014 60  43 - 77 % Final  . Neutro Abs 04/13/2014 4.1  1.7 - 7.7 K/uL Final  . Lymphocytes Relative 04/13/2014 31  12 - 46 % Final  . Lymphs Abs 04/13/2014 2.1  0.7 - 4.0 K/uL Final  . Monocytes Relative 04/13/2014 7  3 - 12 % Final  . Monocytes Absolute 04/13/2014 0.5  0.1 - 1.0 K/uL Final  . Eosinophils Relative 04/13/2014 2  0 - 5 % Final  . Eosinophils Absolute 04/13/2014 0.1  0.0 - 0.7 K/uL Final  . Basophils Relative 04/13/2014 0  0 - 1 % Final  . Basophils Absolute 04/13/2014 0.0  0.0 - 0.1 K/uL Final  . Sodium 04/13/2014 141  135 - 145 mmol/L Final  . Potassium 04/13/2014 3.9  3.5 - 5.1 mmol/L Final  . Chloride 04/13/2014 101  96 - 112 mmol/L Final  . CO2 04/13/2014 30  19 - 32 mmol/L Final  . Glucose, Bld 04/13/2014 100* 70 - 99 mg/dL Final  . BUN 04/13/2014 19  6 - 23 mg/dL Final  . Creatinine, Ser 04/13/2014 0.89  0.50 - 1.10 mg/dL Final  . Calcium 04/13/2014 9.0  8.4 - 10.5 mg/dL Final  . Total Protein 04/13/2014 7.6  6.0 - 8.3 g/dL Final  . Albumin 04/13/2014 3.7  3.5 - 5.2 g/dL Final  . AST 04/13/2014 18  0 - 37 U/L Final  . ALT 04/13/2014 15  0 - 35 U/L Final  . Alkaline Phosphatase 04/13/2014 91  39 - 117 U/L Final  . Total Bilirubin 04/13/2014 0.3  0.3 - 1.2 mg/dL Final  . GFR calc non Af Amer 04/13/2014 73* >90 mL/min Final  . GFR calc Af Amer 04/13/2014 85* >90 mL/min Final   Comment: (NOTE) The eGFR has been calculated using the CKD EPI equation. This calculation has not been validated in all clinical situations. eGFR's persistently <90 mL/min signify possible Chronic Kidney Disease.   . Anion gap  04/13/2014 10  5 - 15 Final  . Prothrombin Time 04/13/2014 13.3  11.6 - 15.2 seconds Final  . INR 04/13/2014 1.00  0.00 - 1.49 Final  . Color, Urine 04/13/2014 YELLOW  YELLOW Final  . APPearance 04/13/2014 CLOUDY* CLEAR Final  . Specific Gravity, Urine 04/13/2014 1.027  1.005 - 1.030 Final  . pH 04/13/2014 7.0  5.0 - 8.0 Final  . Glucose, UA 04/13/2014 NEGATIVE  NEGATIVE mg/dL Final  . Hgb urine dipstick 04/13/2014 NEGATIVE  NEGATIVE Final  . Bilirubin Urine 04/13/2014 NEGATIVE  NEGATIVE Final  . Ketones, ur 04/13/2014 NEGATIVE  NEGATIVE mg/dL Final  . Protein, ur 04/13/2014 NEGATIVE  NEGATIVE mg/dL Final  . Urobilinogen, UA 04/13/2014 0.2  0.0 - 1.0 mg/dL Final  . Nitrite 04/13/2014 NEGATIVE  NEGATIVE Final  . Leukocytes, UA 04/13/2014 NEGATIVE  NEGATIVE Final   MICROSCOPIC NOT DONE ON URINES WITH NEGATIVE PROTEIN, BLOOD, LEUKOCYTES, NITRITE, OR GLUCOSE <1000 mg/dL.  . WBC 04/14/2014 13.7* 4.0 - 10.5 K/uL Final  . RBC 04/14/2014 4.40  3.87 - 5.11 MIL/uL Final  . Hemoglobin 04/14/2014 11.1* 12.0 - 15.0 g/dL Final  . HCT 04/14/2014 36.6  36.0 - 46.0 % Final  . MCV 04/14/2014 83.2  78.0 - 100.0 fL Final  . MCH 04/14/2014 25.2* 26.0 - 34.0 pg Final  . MCHC 04/14/2014 30.3  30.0 - 36.0 g/dL Final  . RDW 04/14/2014 15.5  11.5 - 15.5 % Final  . Platelets 04/14/2014 260  150 - 400 K/uL Final  . Sodium 04/14/2014 138  135 - 145 mmol/L Final  . Potassium 04/14/2014 4.2  3.5 - 5.1 mmol/L Final  . Chloride 04/14/2014 100  96 - 112 mmol/L Final  . CO2 04/14/2014 29  19 - 32 mmol/L Final  . Glucose, Bld 04/14/2014 130* 70 - 99 mg/dL Final  . BUN 04/14/2014 14  6 - 23 mg/dL Final  . Creatinine, Ser 04/14/2014 0.79  0.50 - 1.10 mg/dL Final  . Calcium 04/14/2014 9.1  8.4 - 10.5 mg/dL Final  . GFR calc non Af Amer 04/14/2014 >90  >90 mL/min Final  . GFR calc Af Amer 04/14/2014 >90  >90 mL/min Final   Comment: (NOTE) The eGFR has been calculated using the CKD EPI equation. This calculation has not  been validated in all clinical situations. eGFR's persistently <90 mL/min signify possible Chronic Kidney Disease.   . Anion gap 04/14/2014 9  5 - 15 Final  . WBC 04/15/2014 10.9* 4.0 - 10.5 K/uL Final  . RBC 04/15/2014 4.05  3.87 - 5.11 MIL/uL Final  . Hemoglobin 04/15/2014 10.3* 12.0 - 15.0 g/dL Final  . HCT 04/15/2014  33.5* 36.0 - 46.0 % Final  . MCV 04/15/2014 82.7  78.0 - 100.0 fL Final  . MCH 04/15/2014 25.4* 26.0 - 34.0 pg Final  . MCHC 04/15/2014 30.7  30.0 - 36.0 g/dL Final  . RDW 04/15/2014 15.7* 11.5 - 15.5 % Final  . Platelets 04/15/2014 248  150 - 400 K/uL Final  . Sodium 04/15/2014 137  135 - 145 mmol/L Final  . Potassium 04/15/2014 3.8  3.5 - 5.1 mmol/L Final  . Chloride 04/15/2014 98  96 - 112 mmol/L Final  . CO2 04/15/2014 30  19 - 32 mmol/L Final  . Glucose, Bld 04/15/2014 110* 70 - 99 mg/dL Final  . BUN 04/15/2014 19  6 - 23 mg/dL Final  . Creatinine, Ser 04/15/2014 1.01  0.50 - 1.10 mg/dL Final  . Calcium 04/15/2014 8.8  8.4 - 10.5 mg/dL Final  . GFR calc non Af Amer 04/15/2014 63* >90 mL/min Final  . GFR calc Af Amer 04/15/2014 73* >90 mL/min Final   Comment: (NOTE) The eGFR has been calculated using the CKD EPI equation. This calculation has not been validated in all clinical situations. eGFR's persistently <90 mL/min signify possible Chronic Kidney Disease.   Georgiann Hahn gap 04/15/2014 9  5 - 15 Final  Hospital Outpatient Visit on 04/08/2014  Component Date Value Ref Range Status  . MRSA, PCR 04/08/2014 NEGATIVE  NEGATIVE Final  . Staphylococcus aureus 04/08/2014 NEGATIVE  NEGATIVE Final   Comment:        The Xpert SA Assay (FDA approved for NASAL specimens in patients over 59 years of age), is one component of a comprehensive surveillance program.  Test performance has been validated by [facility] for patients greater than or equal to 80 year old. It is not intended to diagnose infection nor to guide or monitor treatment.   . WBC 04/08/2014 8.5   4.0 - 10.5 K/uL Final  . RBC 04/08/2014 4.30  3.87 - 5.11 MIL/uL Final  . Hemoglobin 04/08/2014 10.8* 12.0 - 15.0 g/dL Final  . HCT 04/08/2014 36.0  36.0 - 46.0 % Final  . MCV 04/08/2014 83.7  78.0 - 100.0 fL Final  . MCH 04/08/2014 25.1* 26.0 - 34.0 pg Final  . MCHC 04/08/2014 30.0  30.0 - 36.0 g/dL Final  . RDW 04/08/2014 15.8* 11.5 - 15.5 % Final  . Platelets 04/08/2014 286  150 - 400 K/uL Final  . Sodium 04/08/2014 140  135 - 145 mmol/L Final  . Potassium 04/08/2014 3.9  3.5 - 5.1 mmol/L Final  . Chloride 04/08/2014 103  96 - 112 mmol/L Final  . CO2 04/08/2014 30  19 - 32 mmol/L Final  . Glucose, Bld 04/08/2014 95  70 - 99 mg/dL Final  . BUN 04/08/2014 19  6 - 23 mg/dL Final  . Creatinine, Ser 04/08/2014 1.01  0.50 - 1.10 mg/dL Final  . Calcium 04/08/2014 8.9  8.4 - 10.5 mg/dL Final  . GFR calc non Af Amer 04/08/2014 63* >90 mL/min Final  . GFR calc Af Amer 04/08/2014 73* >90 mL/min Final   Comment: (NOTE) The eGFR has been calculated using the CKD EPI equation. This calculation has not been validated in all clinical situations. eGFR's persistently <90 mL/min signify possible Chronic Kidney Disease.   . Anion gap 04/08/2014 7  5 - 15 Final  . aPTT 04/08/2014 30  24 - 37 seconds Final  . Prothrombin Time 04/08/2014 13.5  11.6 - 15.2 seconds Final  . INR 04/08/2014 1.02  0.00 -  1.49 Final  . ABO/RH(D) 04/08/2014 B POS   Final  . Antibody Screen 04/08/2014 NEG   Final  . Sample Expiration 04/08/2014 04/16/2014   Final     EKG: Orders placed or performed during the hospital encounter of 04/13/14  . EKG  . EKG 12-Lead  . EKG 12-Lead     Hospital Course: Kelsey Simmons is a 54 y.o. who was admitted to [facility]. They were brought to the operating room on 04/13/2014 and underwent Procedure(s): LEFT TOTAL KNEE ARTHROPLASTY.  Patient tolerated the procedure well and was later transferred to the recovery room and then to the orthopaedic floor for postoperative care.   They were given PO and IV analgesics for pain control following their surgery.  They were given 24 hours of postoperative antibiotics of  Anti-infectives    Start     Dose/Rate Route Frequency Ordered Stop   04/13/14 1400  ceFAZolin (ANCEF) IVPB 1 g/50 mL premix     1 g 100 mL/hr over 30 Minutes Intravenous Every 6 hours 04/13/14 1320 04/13/14 2125   04/13/14 0911  polymyxin B 500,000 Units, bacitracin 50,000 Units in sodium chloride irrigation 0.9 % 500 mL irrigation  Status:  Discontinued       As needed 04/13/14 0911 04/13/14 0951   04/13/14 0800  polymyxin B 500,000 Units, bacitracin 50,000 Units in sodium chloride irrigation 0.9 % 500 mL irrigation  Status:  Discontinued      Irrigation  Once 04/13/14 0751 04/13/14 1309   04/13/14 0600  ceFAZolin (ANCEF) IVPB 2 g/50 mL premix     2 g 100 mL/hr over 30 Minutes Intravenous On call to O.R. 04/13/14 1448 04/13/14 0723     and started on DVT prophylaxis in the form of Xarelto.   PT and OT were ordered for total joint protocol.  Discharge planning consulted to help with postop disposition and equipment needs.  Patient had a fair night on the evening of surgery.  They started to get up OOB with therapy on day one. Hemovac drain was pulled without difficulty.  Continued to work with therapy into day two.   Patient was seen in rounds and was ready to go home.   Diet: Regular diet Activity:WBAT Follow-up:in 2 weeks Disposition - Home Discharged Condition: stable   Discharge Instructions    Call MD / Call 911    Complete by:  As directed   If you experience chest pain or shortness of breath, CALL 911 and be transported to the hospital emergency room.  If you develope a fever above 101 F, pus (white drainage) or increased drainage or redness at the wound, or calf pain, call your surgeon's office.     Constipation Prevention    Complete by:  As directed   Drink plenty of fluids.  Prune juice may be helpful.  You may use a stool softener, such as  Colace (over the counter) 100 mg twice a day.  Use MiraLax (over the counter) for constipation as needed.     Diet - low sodium heart healthy    Complete by:  As directed      Discharge instructions    Complete by:  As directed   INSTRUCTIONS AFTER JOINT REPLACEMENT   Remove items at home which could result in a fall. This includes throw rugs or furniture in walking pathways ICE to the affected joint every three hours while awake for 30 minutes at a time, for at least the first 3-5 days,  and then as needed for pain and swelling.  Continue to use ice for pain and swelling. You may notice swelling that will progress down to the foot and ankle.  This is normal after surgery.  Elevate your leg when you are not up walking on it.   Continue to use the breathing machine you got in the hospital (incentive spirometer) which will help keep your temperature down.  It is common for your temperature to cycle up and down following surgery, especially at night when you are not up moving around and exerting yourself.  The breathing machine keeps your lungs expanded and your temperature down. DIET:  As you were doing prior to hospitalization, we recommend a well-balanced diet. DRESSING / WOUND CARE / SHOWERING Keep the surgical dressing until follow up.  The dressing is water proof, so you can shower without any extra covering.  IF THE DRESSING FALLS OFF or the wound gets wet inside, change the dressing with sterile gauze.  Please use good hand washing techniques before changing the dressing.  Do not use any lotions or creams on the incision until instructed by your surgeon.    ACTIVITY Increase activity slowly as tolerated, but follow the weight bearing instructions below.   No driving for 6 weeks or until further direction given by your physician.  You cannot drive while taking narcotics.  No lifting or carrying greater than 10 lbs. until further directed by your surgeon. Avoid periods of inactivity such as  sitting longer than an hour when not asleep. This helps prevent blood clots.  You may return to work once you are authorized by your doctor.  WEIGHT BEARING  Weight bearing as tolerated with assist device (walker, cane, etc) as directed, use it as long as suggested by your surgeon or therapist, typically at least 4-6 weeks. EXERCISES Results after joint replacement surgery are often greatly improved when you follow the exercise, range of motion and muscle strengthening exercises prescribed by your doctor. Safety measures are also important to protect the joint from further injury. Any time any of these exercises cause you to have increased pain or swelling, decrease what you are doing until you are comfortable again and then slowly increase them. If you have problems or questions, call your caregiver or physical therapist for advice.  Rehabilitation is important following a joint replacement. After just a few days of immobilization, the muscles of the leg can become weakened and shrink (atrophy).  These exercises are designed to build up the tone and strength of the thigh and leg muscles and to improve motion. Often times heat used for twenty to thirty minutes before working out will loosen up your tissues and help with improving the range of motion but do not use heat for the first two weeks following surgery (sometimes heat can increase post-operative swelling).   These exercises can be done on a training (exercise) mat, on the floor, on a table or on a bed. Use whatever works the best and is most comfortable for you.    Use music or television while you are exercising so that the exercises are a pleasant break in your day. This will make your life better with the exercises acting as a break in your routine that you can look forward to.   Perform all exercises about fifteen times, three times per day or as directed.  You should exercise both the operative leg and the other leg as well.  Exercises  include:   Quad Sets -  Tighten up the muscle on the front of the thigh (Quad) and hold for 5-10 seconds.   Straight Leg Raises - With your knee straight (if you were given a brace, keep it on), lift the leg to 60 degrees, hold for 3 seconds, and slowly lower the leg.  Perform this exercise against resistance later as your leg gets stronger.  Leg Slides: Lying on your back, slowly slide your foot toward your buttocks, bending your knee up off the floor (only go as far as is comfortable). Then slowly slide your foot back down until your leg is flat on the floor again.  Angel Wings: Lying on your back spread your legs to the side as far apart as you can without causing discomfort.  Hamstring Strength:  Lying on your back, push your heel against the floor with your leg straight by tightening up the muscles of your buttocks.  Repeat, but this time bend your knee to a comfortable angle, and push your heel against the floor.  You may put a pillow under the heel to make it more comfortable if necessary.  A rehabilitation program following joint replacement surgery can speed recovery and prevent re-injury in the future due to weakened muscles. Contact your doctor or a physical therapist for more information on knee rehabilitation.    CONSTIPATION Constipation is defined medically as fewer than three stools per week and severe constipation as less than one stool per week.  Even if you have a regular bowel pattern at home, your normal regimen is likely to be disrupted due to multiple reasons following surgery.  Combination of anesthesia, postoperative narcotics, change in appetite and fluid intake all can affect your bowels.   YOU MUST use at least one of the following options; they are listed in order of increasing strength to get the job done.  They are all available over the counter, and you may need to use some, POSSIBLY even all of these options:   Drink plenty of fluids (prune juice may be helpful) and high  fiber foods Colace 100 mg by mouth twice a day  Senokot for constipation as directed and as needed Dulcolax (bisacodyl), take with full glass of water  Miralax (polyethylene glycol) once or twice a day as needed. If you have tried all these things and are unable to have a bowel movement in the first 3-4 days after surgery call either your surgeon or your primary doctor.   If you experience loose stools or diarrhea, hold the medications until you stool forms back up.  If your symptoms do not get better within 1 week or if they get worse, check with your doctor.  If you experience "the worst abdominal pain ever" or develop nausea or vomiting, please contact the office immediately for further recommendations for treatment. ITCHING:  If you experience itching with your medications, try taking only a single pain pill, or even half a pain pill at a time.  You can also use Benadryl over the counter for itching or also to help with sleep.  TED HOSE STOCKINGS:  Use stockings on both legs until for at least 2 weeks or as directed by physician office. They may be removed at night for sleeping. MEDICATIONS:  See your medication summary on the "After Visit Summary" that nursing will review with you.  You may have some home medications which will be placed on hold until you complete the course of blood thinner medication.  It is important for you to complete the  blood thinner medication as prescribed. PRECAUTIONS:  If you experience chest pain or shortness of breath - call 911 immediately for transfer to the hospital emergency department.  If you develop a fever greater that 101 F, purulent drainage from wound, increased redness or drainage from wound, foul odor from the wound/dressing, or calf pain - CONTACT YOUR SURGEON.                                   FOLLOW-UP APPOINTMENTS:  If you do not already have a post-op appointment, please call the office for an appointment to be seen by your surgeon.  Guidelines for how  soon to be seen are listed in your "After Visit Summary", but are typically between 1-4 weeks after surgery. OTHER INSTRUCTIONS:  Knee Replacement:  Do not place pillow under knee, focus on keeping the knee straight while resting.  MAKE SURE YOU:  Understand these instructions.  Get help right away if you are not doing well or get worse.  Thank you for letting us be a part of your medical care team.  It is a privilege we respect greatly.  We hope these instructions will help you stay on track for a fast and full recovery!     Increase activity slowly as tolerated    Complete by:  As directed             Medication List    STOP taking these medications        celecoxib 200 MG capsule  Commonly known as:  CELEBREX     traMADol 50 MG tablet  Commonly known as:  ULTRAM      TAKE these medications        docusate sodium 100 MG capsule  Commonly known as:  COLACE  Take 1 capsule (100 mg total) by mouth 2 (two) times daily.     DULoxetine 30 MG capsule  Commonly known as:  CYMBALTA  Take 30 mg by mouth at bedtime.     ferrous sulfate 325 (65 FE) MG tablet  Take 1 tablet (325 mg total) by mouth 3 (three) times daily after meals.     hydrochlorothiazide 25 MG tablet  Commonly known as:  HYDRODIURIL  Take 25 mg by mouth every morning.     HYDROmorphone 2 MG tablet  Commonly known as:  DILAUDID  Take 1-2 tablets (2-4 mg total) by mouth every 4 (four) hours as needed for moderate pain or severe pain.     methocarbamol 500 MG tablet  Commonly known as:  ROBAXIN  Take 1 tablet (500 mg total) by mouth every 6 (six) hours as needed for muscle spasms.     omeprazole 20 MG capsule  Commonly known as:  PRILOSEC  Take 20 mg by mouth daily.     propranolol ER 60 MG 24 hr capsule  Commonly known as:  INDERAL LA  Take 180 mg by mouth at bedtime.     rivaroxaban 10 MG Tabs tablet  Commonly known as:  XARELTO  Take 1 tablet (10 mg total) by mouth daily with breakfast.      SUMAtriptan 100 MG tablet  Commonly known as:  IMITREX  Take 100 mg by mouth every 2 (two) hours as needed for migraine or headache. May repeat in 2 hours if headache persists or recurs.           Follow-up Information    Follow up  with [facility].   Why:  home health physical therapy   Contact information:   [address] [place] [address] [phone]       Follow up with GIOFFRE,RONALD A, MD. Schedule an appointment as soon as possible for a visit in 2 weeks.   Specialty:  Orthopedic Surgery   Contact information:   [address] [place] [address] [phone]       Signed: Ardeen Jourdain, PA-C Orthopaedic Surgery 04/19/2014, 3:14 PM

## 2014-11-26 ENCOUNTER — Other Ambulatory Visit: Payer: Self-pay | Admitting: Family Medicine

## 2014-11-26 DIAGNOSIS — N951 Menopausal and female climacteric states: Secondary | ICD-10-CM

## 2014-12-29 ENCOUNTER — Ambulatory Visit
Admission: RE | Admit: 2014-12-29 | Discharge: 2014-12-29 | Disposition: A | Discharge status: Home / Self Care | Payer: BLUE CROSS/BLUE SHIELD | Source: Ambulatory Visit | Attending: Family Medicine | Admitting: Family Medicine

## 2014-12-29 DIAGNOSIS — N951 Menopausal and female climacteric states: Secondary | ICD-10-CM

## 2015-04-12 DIAGNOSIS — I10 Essential (primary) hypertension: Secondary | ICD-10-CM | POA: Insufficient documentation

## 2015-04-20 ENCOUNTER — Other Ambulatory Visit: Payer: Self-pay

## 2015-04-20 DIAGNOSIS — Z1231 Encounter for screening mammogram for malignant neoplasm of breast: Secondary | ICD-10-CM

## 2015-05-12 ENCOUNTER — Ambulatory Visit
Admission: RE | Admit: 2015-05-12 | Discharge: 2015-05-12 | Disposition: A | Discharge status: Home / Self Care | Payer: BLUE CROSS/BLUE SHIELD | Source: Ambulatory Visit

## 2015-05-12 DIAGNOSIS — Z1231 Encounter for screening mammogram for malignant neoplasm of breast: Secondary | ICD-10-CM

## 2015-05-31 DIAGNOSIS — K219 Gastro-esophageal reflux disease without esophagitis: Secondary | ICD-10-CM | POA: Diagnosis not present

## 2015-06-30 DIAGNOSIS — K130 Diseases of lips: Secondary | ICD-10-CM | POA: Diagnosis not present

## 2015-07-12 DIAGNOSIS — I10 Essential (primary) hypertension: Secondary | ICD-10-CM | POA: Diagnosis not present

## 2015-07-12 DIAGNOSIS — K219 Gastro-esophageal reflux disease without esophagitis: Secondary | ICD-10-CM | POA: Diagnosis not present

## 2015-07-12 DIAGNOSIS — G43909 Migraine, unspecified, not intractable, without status migrainosus: Secondary | ICD-10-CM | POA: Diagnosis not present

## 2015-07-12 DIAGNOSIS — B370 Candidal stomatitis: Secondary | ICD-10-CM | POA: Diagnosis not present

## 2015-08-07 DIAGNOSIS — Z0000 Encounter for general adult medical examination without abnormal findings: Secondary | ICD-10-CM | POA: Diagnosis not present

## 2015-08-14 DIAGNOSIS — R7303 Prediabetes: Secondary | ICD-10-CM | POA: Diagnosis not present

## 2015-08-14 DIAGNOSIS — D508 Other iron deficiency anemias: Secondary | ICD-10-CM | POA: Diagnosis not present

## 2015-09-18 DIAGNOSIS — M79672 Pain in left foot: Secondary | ICD-10-CM | POA: Diagnosis not present

## 2015-10-18 DIAGNOSIS — R05 Cough: Secondary | ICD-10-CM | POA: Diagnosis not present

## 2015-10-18 DIAGNOSIS — J069 Acute upper respiratory infection, unspecified: Secondary | ICD-10-CM | POA: Diagnosis not present

## 2015-10-25 DIAGNOSIS — I10 Essential (primary) hypertension: Secondary | ICD-10-CM | POA: Diagnosis not present

## 2015-10-25 DIAGNOSIS — K219 Gastro-esophageal reflux disease without esophagitis: Secondary | ICD-10-CM | POA: Diagnosis not present

## 2015-10-25 DIAGNOSIS — G43909 Migraine, unspecified, not intractable, without status migrainosus: Secondary | ICD-10-CM | POA: Diagnosis not present

## 2015-11-10 DIAGNOSIS — Z23 Encounter for immunization: Secondary | ICD-10-CM | POA: Diagnosis not present

## 2015-11-13 DIAGNOSIS — D508 Other iron deficiency anemias: Secondary | ICD-10-CM | POA: Diagnosis not present

## 2015-11-13 DIAGNOSIS — Z0000 Encounter for general adult medical examination without abnormal findings: Secondary | ICD-10-CM | POA: Diagnosis not present

## 2015-11-13 DIAGNOSIS — R7303 Prediabetes: Secondary | ICD-10-CM | POA: Diagnosis not present

## 2015-11-20 DIAGNOSIS — K219 Gastro-esophageal reflux disease without esophagitis: Secondary | ICD-10-CM | POA: Diagnosis not present

## 2015-11-20 DIAGNOSIS — Z23 Encounter for immunization: Secondary | ICD-10-CM | POA: Diagnosis not present

## 2015-11-20 DIAGNOSIS — Z0000 Encounter for general adult medical examination without abnormal findings: Secondary | ICD-10-CM | POA: Diagnosis not present

## 2015-12-19 DIAGNOSIS — F4323 Adjustment disorder with mixed anxiety and depressed mood: Secondary | ICD-10-CM | POA: Diagnosis not present

## 2015-12-27 DIAGNOSIS — M542 Cervicalgia: Secondary | ICD-10-CM | POA: Diagnosis not present

## 2015-12-27 DIAGNOSIS — Z96653 Presence of artificial knee joint, bilateral: Secondary | ICD-10-CM | POA: Diagnosis not present

## 2015-12-27 DIAGNOSIS — Z471 Aftercare following joint replacement surgery: Secondary | ICD-10-CM | POA: Diagnosis not present

## 2015-12-28 DIAGNOSIS — F4323 Adjustment disorder with mixed anxiety and depressed mood: Secondary | ICD-10-CM | POA: Diagnosis not present

## 2016-01-19 ENCOUNTER — Other Ambulatory Visit: Payer: Self-pay | Admitting: Physical Medicine and Rehabilitation

## 2016-01-19 DIAGNOSIS — M48062 Spinal stenosis, lumbar region with neurogenic claudication: Secondary | ICD-10-CM

## 2016-01-29 DIAGNOSIS — F4323 Adjustment disorder with mixed anxiety and depressed mood: Secondary | ICD-10-CM | POA: Diagnosis not present

## 2016-02-02 DIAGNOSIS — I10 Essential (primary) hypertension: Secondary | ICD-10-CM | POA: Diagnosis not present

## 2016-02-02 DIAGNOSIS — K219 Gastro-esophageal reflux disease without esophagitis: Secondary | ICD-10-CM | POA: Diagnosis not present

## 2016-02-12 DIAGNOSIS — F4323 Adjustment disorder with mixed anxiety and depressed mood: Secondary | ICD-10-CM | POA: Diagnosis not present

## 2016-03-18 DIAGNOSIS — M25572 Pain in left ankle and joints of left foot: Secondary | ICD-10-CM | POA: Diagnosis not present

## 2016-03-18 DIAGNOSIS — M25549 Pain in joints of unspecified hand: Secondary | ICD-10-CM | POA: Diagnosis not present

## 2016-04-12 DIAGNOSIS — M25549 Pain in joints of unspecified hand: Secondary | ICD-10-CM | POA: Diagnosis not present

## 2016-04-12 DIAGNOSIS — G43109 Migraine with aura, not intractable, without status migrainosus: Secondary | ICD-10-CM | POA: Diagnosis not present

## 2016-04-12 DIAGNOSIS — M25572 Pain in left ankle and joints of left foot: Secondary | ICD-10-CM | POA: Diagnosis not present

## 2016-04-22 DIAGNOSIS — R5383 Other fatigue: Secondary | ICD-10-CM | POA: Diagnosis not present

## 2016-04-22 DIAGNOSIS — R05 Cough: Secondary | ICD-10-CM | POA: Diagnosis not present

## 2016-05-01 DIAGNOSIS — I10 Essential (primary) hypertension: Secondary | ICD-10-CM | POA: Diagnosis not present

## 2016-05-01 DIAGNOSIS — K219 Gastro-esophageal reflux disease without esophagitis: Secondary | ICD-10-CM | POA: Diagnosis not present

## 2016-05-08 ENCOUNTER — Other Ambulatory Visit: Payer: Self-pay | Admitting: Family Medicine

## 2016-05-08 DIAGNOSIS — Z1231 Encounter for screening mammogram for malignant neoplasm of breast: Secondary | ICD-10-CM

## 2016-05-22 DIAGNOSIS — G43109 Migraine with aura, not intractable, without status migrainosus: Secondary | ICD-10-CM | POA: Diagnosis not present

## 2016-05-28 ENCOUNTER — Ambulatory Visit
Admission: RE | Admit: 2016-05-28 | Discharge: 2016-05-28 | Disposition: A | Discharge status: Home / Self Care | Payer: BLUE CROSS/BLUE SHIELD | Source: Ambulatory Visit | Attending: Family Medicine | Admitting: Family Medicine

## 2016-05-28 DIAGNOSIS — Z1231 Encounter for screening mammogram for malignant neoplasm of breast: Secondary | ICD-10-CM | POA: Diagnosis not present

## 2016-06-19 DIAGNOSIS — J209 Acute bronchitis, unspecified: Secondary | ICD-10-CM | POA: Diagnosis not present

## 2016-07-12 DIAGNOSIS — R05 Cough: Secondary | ICD-10-CM | POA: Diagnosis not present

## 2016-07-17 DIAGNOSIS — M898X6 Other specified disorders of bone, lower leg: Secondary | ICD-10-CM | POA: Diagnosis not present

## 2016-07-17 DIAGNOSIS — M722 Plantar fascial fibromatosis: Secondary | ICD-10-CM | POA: Diagnosis not present

## 2016-07-22 DIAGNOSIS — K143 Hypertrophy of tongue papillae: Secondary | ICD-10-CM | POA: Diagnosis not present

## 2016-08-01 DIAGNOSIS — I10 Essential (primary) hypertension: Secondary | ICD-10-CM | POA: Diagnosis not present

## 2016-08-01 DIAGNOSIS — G43109 Migraine with aura, not intractable, without status migrainosus: Secondary | ICD-10-CM | POA: Diagnosis not present

## 2016-08-01 DIAGNOSIS — K219 Gastro-esophageal reflux disease without esophagitis: Secondary | ICD-10-CM | POA: Diagnosis not present

## 2016-08-23 DIAGNOSIS — Z0000 Encounter for general adult medical examination without abnormal findings: Secondary | ICD-10-CM | POA: Diagnosis not present

## 2016-09-18 DIAGNOSIS — G43109 Migraine with aura, not intractable, without status migrainosus: Secondary | ICD-10-CM | POA: Diagnosis not present

## 2016-09-27 DIAGNOSIS — M7662 Achilles tendinitis, left leg: Secondary | ICD-10-CM | POA: Diagnosis not present

## 2016-11-01 DIAGNOSIS — Z23 Encounter for immunization: Secondary | ICD-10-CM | POA: Diagnosis not present

## 2016-11-11 DIAGNOSIS — I10 Essential (primary) hypertension: Secondary | ICD-10-CM | POA: Diagnosis not present

## 2016-11-11 DIAGNOSIS — M7662 Achilles tendinitis, left leg: Secondary | ICD-10-CM | POA: Diagnosis not present

## 2016-11-26 DIAGNOSIS — F329 Major depressive disorder, single episode, unspecified: Secondary | ICD-10-CM | POA: Diagnosis not present

## 2016-11-26 DIAGNOSIS — K219 Gastro-esophageal reflux disease without esophagitis: Secondary | ICD-10-CM | POA: Diagnosis not present

## 2016-11-26 DIAGNOSIS — Z0000 Encounter for general adult medical examination without abnormal findings: Secondary | ICD-10-CM | POA: Diagnosis not present

## 2016-12-06 DIAGNOSIS — G43109 Migraine with aura, not intractable, without status migrainosus: Secondary | ICD-10-CM | POA: Diagnosis not present

## 2016-12-06 DIAGNOSIS — T887XXA Unspecified adverse effect of drug or medicament, initial encounter: Secondary | ICD-10-CM | POA: Diagnosis not present

## 2017-02-07 DIAGNOSIS — I10 Essential (primary) hypertension: Secondary | ICD-10-CM | POA: Diagnosis not present

## 2017-02-07 DIAGNOSIS — R7301 Impaired fasting glucose: Secondary | ICD-10-CM | POA: Diagnosis not present

## 2017-02-28 DIAGNOSIS — G43109 Migraine with aura, not intractable, without status migrainosus: Secondary | ICD-10-CM | POA: Diagnosis not present

## 2017-05-23 DIAGNOSIS — I10 Essential (primary) hypertension: Secondary | ICD-10-CM | POA: Diagnosis not present

## 2017-05-23 DIAGNOSIS — R7303 Prediabetes: Secondary | ICD-10-CM | POA: Diagnosis not present

## 2017-05-23 DIAGNOSIS — K219 Gastro-esophageal reflux disease without esophagitis: Secondary | ICD-10-CM | POA: Diagnosis not present

## 2017-05-24 LAB — HEMOGLOBIN A1C: Hemoglobin A1C: 6.1

## 2017-06-02 ENCOUNTER — Other Ambulatory Visit: Payer: Self-pay | Admitting: Family Medicine

## 2017-06-02 DIAGNOSIS — Z1231 Encounter for screening mammogram for malignant neoplasm of breast: Secondary | ICD-10-CM

## 2017-06-09 DIAGNOSIS — G43109 Migraine with aura, not intractable, without status migrainosus: Secondary | ICD-10-CM | POA: Diagnosis not present

## 2017-06-09 DIAGNOSIS — M5410 Radiculopathy, site unspecified: Secondary | ICD-10-CM | POA: Diagnosis not present

## 2017-06-24 ENCOUNTER — Ambulatory Visit
Admission: RE | Admit: 2017-06-24 | Discharge: 2017-06-24 | Disposition: A | Discharge status: Home / Self Care | Payer: BLUE CROSS/BLUE SHIELD | Source: Ambulatory Visit | Attending: Family Medicine | Admitting: Family Medicine

## 2017-06-24 DIAGNOSIS — Z1231 Encounter for screening mammogram for malignant neoplasm of breast: Secondary | ICD-10-CM | POA: Diagnosis not present

## 2017-08-04 DIAGNOSIS — B356 Tinea cruris: Secondary | ICD-10-CM | POA: Diagnosis not present

## 2017-08-08 DIAGNOSIS — K219 Gastro-esophageal reflux disease without esophagitis: Secondary | ICD-10-CM | POA: Diagnosis not present

## 2017-08-20 ENCOUNTER — Other Ambulatory Visit: Payer: Self-pay | Admitting: Physical Medicine and Rehabilitation

## 2017-08-20 DIAGNOSIS — M5417 Radiculopathy, lumbosacral region: Secondary | ICD-10-CM

## 2017-08-22 DIAGNOSIS — I10 Essential (primary) hypertension: Secondary | ICD-10-CM | POA: Diagnosis not present

## 2017-08-22 DIAGNOSIS — G43109 Migraine with aura, not intractable, without status migrainosus: Secondary | ICD-10-CM | POA: Diagnosis not present

## 2017-08-22 DIAGNOSIS — R05 Cough: Secondary | ICD-10-CM | POA: Diagnosis not present

## 2017-08-22 DIAGNOSIS — K219 Gastro-esophageal reflux disease without esophagitis: Secondary | ICD-10-CM | POA: Diagnosis not present

## 2017-08-27 ENCOUNTER — Ambulatory Visit
Admission: RE | Admit: 2017-08-27 | Discharge: 2017-08-27 | Disposition: A | Discharge status: Home / Self Care | Payer: Worker's Compensation | Source: Ambulatory Visit | Attending: Physical Medicine and Rehabilitation | Admitting: Physical Medicine and Rehabilitation

## 2017-08-27 DIAGNOSIS — M5417 Radiculopathy, lumbosacral region: Secondary | ICD-10-CM

## 2017-08-27 DIAGNOSIS — M48061 Spinal stenosis, lumbar region without neurogenic claudication: Secondary | ICD-10-CM | POA: Diagnosis not present

## 2017-09-08 DIAGNOSIS — Z0000 Encounter for general adult medical examination without abnormal findings: Secondary | ICD-10-CM | POA: Diagnosis not present

## 2017-09-09 LAB — LIPID PANEL
Cholesterol: 258 — AB (ref 0–200)
HDL: 106 — AB (ref 35–70)
LDL Cholesterol: 128
Triglycerides: 118 (ref 40–160)

## 2017-09-09 LAB — BASIC METABOLIC PANEL
Creatinine: 0.9 (ref <=1.1)
Glucose: 103

## 2017-10-15 DIAGNOSIS — G43109 Migraine with aura, not intractable, without status migrainosus: Secondary | ICD-10-CM | POA: Diagnosis not present

## 2017-10-29 DIAGNOSIS — Z23 Encounter for immunization: Secondary | ICD-10-CM | POA: Diagnosis not present

## 2017-12-11 DIAGNOSIS — K219 Gastro-esophageal reflux disease without esophagitis: Secondary | ICD-10-CM | POA: Diagnosis not present

## 2017-12-11 DIAGNOSIS — G43109 Migraine with aura, not intractable, without status migrainosus: Secondary | ICD-10-CM | POA: Diagnosis not present

## 2017-12-11 DIAGNOSIS — I10 Essential (primary) hypertension: Secondary | ICD-10-CM | POA: Diagnosis not present

## 2017-12-11 DIAGNOSIS — J309 Allergic rhinitis, unspecified: Secondary | ICD-10-CM | POA: Diagnosis not present

## 2017-12-17 DIAGNOSIS — B359 Dermatophytosis, unspecified: Secondary | ICD-10-CM | POA: Diagnosis not present

## 2017-12-17 DIAGNOSIS — B370 Candidal stomatitis: Secondary | ICD-10-CM | POA: Diagnosis not present

## 2018-01-19 DIAGNOSIS — D508 Other iron deficiency anemias: Secondary | ICD-10-CM | POA: Diagnosis not present

## 2018-01-19 DIAGNOSIS — Z0000 Encounter for general adult medical examination without abnormal findings: Secondary | ICD-10-CM | POA: Diagnosis not present

## 2018-01-19 DIAGNOSIS — B370 Candidal stomatitis: Secondary | ICD-10-CM | POA: Diagnosis not present

## 2018-01-19 DIAGNOSIS — R7303 Prediabetes: Secondary | ICD-10-CM | POA: Diagnosis not present

## 2018-01-20 LAB — HEMOGLOBIN A1C: Hemoglobin A1C: 6.4

## 2018-01-21 DIAGNOSIS — Z87442 Personal history of urinary calculi: Secondary | ICD-10-CM

## 2018-01-21 DIAGNOSIS — F32A Depression, unspecified: Secondary | ICD-10-CM

## 2018-01-21 HISTORY — DX: Personal history of urinary calculi: Z87.442

## 2018-01-21 HISTORY — DX: Depression, unspecified: F32.A

## 2018-03-03 DIAGNOSIS — F411 Generalized anxiety disorder: Secondary | ICD-10-CM | POA: Diagnosis not present

## 2018-03-06 DIAGNOSIS — Z760 Encounter for issue of repeat prescription: Secondary | ICD-10-CM | POA: Diagnosis not present

## 2018-03-06 DIAGNOSIS — J309 Allergic rhinitis, unspecified: Secondary | ICD-10-CM | POA: Diagnosis not present

## 2018-03-06 DIAGNOSIS — G43109 Migraine with aura, not intractable, without status migrainosus: Secondary | ICD-10-CM | POA: Diagnosis not present

## 2018-03-06 DIAGNOSIS — I10 Essential (primary) hypertension: Secondary | ICD-10-CM | POA: Diagnosis not present

## 2018-03-17 DIAGNOSIS — F411 Generalized anxiety disorder: Secondary | ICD-10-CM | POA: Diagnosis not present

## 2018-03-19 ENCOUNTER — Other Ambulatory Visit: Payer: Self-pay

## 2018-03-19 ENCOUNTER — Ambulatory Visit (INDEPENDENT_AMBULATORY_CARE_PROVIDER_SITE_OTHER): Payer: BLUE CROSS/BLUE SHIELD | Admitting: Family Medicine

## 2018-03-19 VITALS — BP 102/68 | HR 63 | Temp 97.9°F | Ht 60.0 in | Wt 255.6 lb

## 2018-03-19 DIAGNOSIS — R7303 Prediabetes: Secondary | ICD-10-CM

## 2018-03-19 DIAGNOSIS — Z0001 Encounter for general adult medical examination with abnormal findings: Secondary | ICD-10-CM

## 2018-03-19 DIAGNOSIS — L304 Erythema intertrigo: Secondary | ICD-10-CM

## 2018-03-19 DIAGNOSIS — N898 Other specified noninflammatory disorders of vagina: Secondary | ICD-10-CM | POA: Diagnosis not present

## 2018-03-19 DIAGNOSIS — Z1272 Encounter for screening for malignant neoplasm of vagina: Secondary | ICD-10-CM | POA: Diagnosis not present

## 2018-03-19 DIAGNOSIS — Z1151 Encounter for screening for human papillomavirus (HPV): Secondary | ICD-10-CM

## 2018-03-19 DIAGNOSIS — E7800 Pure hypercholesterolemia, unspecified: Secondary | ICD-10-CM | POA: Diagnosis not present

## 2018-03-19 DIAGNOSIS — Z0000 Encounter for general adult medical examination without abnormal findings: Secondary | ICD-10-CM

## 2018-03-19 LAB — POCT WET + KOH PREP: Trich by wet prep: ABSENT

## 2018-03-19 MED ORDER — CLOTRIMAZOLE-BETAMETHASONE 1-0.05 % EX CREA: 1.0000 "application " | TOPICAL_CREAM | Freq: Two times a day (BID) | CUTANEOUS | 0 refills | Status: DC

## 2018-03-19 NOTE — Patient Instructions (Addendum)
   If you have lab work done today you will be contacted with your lab results within the next 2 weeks.  If you have not heard from us then please contact us. The fastest way to get your results is to register for My Chart.   IF you received an x-ray today, you will receive an invoice from [facility]. Please contact [facility] at [phone] with questions or concerns regarding your invoice.   IF you received labwork today, you will receive an invoice from [facility]. Please contact [facility] at [phone] with questions or concerns regarding your invoice.   Our billing staff will not be able to assist you with questions regarding bills from these companies.  You will be contacted with the lab results as soon as they are available. The fastest way to get your results is to activate your My Chart account. Instructions are located on the last page of this paperwork. If you have not heard from us regarding the results in 2 weeks, please contact this office.     Intertrigo Intertrigo is skin irritation or inflammation (dermatitis) that occurs when folds of skin rub together. The irritation can cause a rash and make skin raw and itchy. This condition most commonly occurs in the skin folds of these areas:  Toes.  Armpits.  Groin.  Under the belly.  Under the breasts.  Buttocks. Intertrigo is not passed from person to person (is not contagious). What are the causes? This condition is caused by heat, moisture, rubbing (friction), and not enough air circulation. The condition can be made worse by:  Sweat.  Bacteria.  A fungus, such as yeast. What increases the risk? This condition is more likely to occur if you have moisture in your skin folds. You are more likely to develop this condition if you:  Have diabetes.  Are overweight.  Are not able to move around or are not active.  Live in a warm and moist climate.  Wear splints, braces, or other medical  devices.  Are not able to control your bowels or bladder (have incontinence). What are the signs or symptoms? Symptoms of this condition include:  A pink or red skin rash in the skin fold or near the skin fold.  Raw or scaly skin.  Itchiness.  A burning feeling.  Bleeding.  Leaking fluid.  A bad smell. How is this diagnosed? This condition is diagnosed with a medical history and physical exam. You may also have a skin swab to test for bacteria or a fungus. How is this treated? This condition may be treated by:  Cleaning and drying your skin.  Taking an antibiotic medicine or using an antibiotic skin cream for a bacterial infection.  Using an antifungal cream on your skin or taking pills for an infection that was caused by a fungus, such as yeast.  Using a steroid ointment to relieve itchiness and irritation.  Separating the skin fold with a clean cotton cloth to absorb moisture and allow air to flow into the area. Follow these instructions at home:  Keep the affected area clean and dry.  Do not scratch your skin.  Stay in a cool environment as much as possible. Use an air conditioner or fan, if available.  Apply over-the-counter and prescription medicines only as told by your health care provider.  If you were prescribed an antibiotic medicine, use it as told by your health care provider. Do not stop using the antibiotic even if your condition improves.    Keep all follow-up visits as told by your health care provider. This is important. How is this prevented?   Maintain a healthy weight.  Take care of your feet, especially if you have diabetes. Foot care includes: ? Wearing shoes that fit well. ? Keeping your feet dry. ? Wearing clean, breathable socks.  Protect the skin around your groin and buttocks, especially if you have incontinence. Skin protection includes: ? Following a regular cleaning routine. ? Using skin protectant creams, powders, or  ointments. ? Changing protection pads frequently.  Do not wear tight clothes. Wear clothes that are loose, absorbent, and made of cotton.  Wear a bra that gives good support, if needed.  Shower and dry yourself well after activity or exercise. Use a hair dryer on a cool setting to dry between skin folds, especially after you bathe.  If you have diabetes, keep your blood sugar under control. Contact a health care provider if:  Your symptoms do not improve with treatment.  Your symptoms get worse or they spread.  You notice increased redness and warmth.  You have a fever. Summary  Intertrigo is skin irritation or inflammation (dermatitis) that occurs when folds of skin rub together.  This condition is caused by heat, moisture, rubbing (friction), and not enough air circulation.  This condition may be treated by cleaning and drying your skin and with medicines.  Apply over-the-counter and prescription medicines only as told by your health care provider.  Keep all follow-up visits as told by your health care provider. This is important. This information is not intended to replace advice given to you by your health care provider. Make sure you discuss any questions you have with your health care provider. Document Released: 01/07/2005 Document Revised: 06/09/2017 Document Reviewed: 06/09/2017 Elsevier Interactive Patient Education  2019 [employer]  

## 2018-03-19 NOTE — Addendum Note (Signed)
Addended by: Nicholaus Corolla on: 03/19/2018 03:01 PM   Modules accepted: Orders

## 2018-03-19 NOTE — Progress Notes (Signed)
New Patient Office Visit  Subjective:  Patient ID: Kelsey Simmons, female    DOB: May 28, 1960  Age: 58 y.o. MRN: [id number]  CC:  Chief Complaint  Patient presents with  . Establish Care    dark line in right middle finger and dark ankles (1 year worth of labs)  . pap and iching    iching in the genital area includign anus.     HPI Kelsey Simmons presents for physical exam She gets her labs drawn with her job which has a clinic on site with [employer] Her labs show a1c of 6.4% She has lipid level of TC 258, LDL 128  She does not exercise despite having a stationary bike She lives with her husband and her daughter She uses a rollator walker that she has been using since 2008 so that she has a seat where rever she goes so that she can sit since she gets back pain and numbness  She also reports htat she ha s some itching in the genitalia but not in the vagina itself  She reports that she sees a therapist every 2 weeks for her depression Depression screen Kelsey Simmons 2/9 03/19/2018  Decreased Interest 1  Down, Depressed, Hopeless 1  PHQ - 2 Score 2  Altered sleeping 1  Tired, decreased energy 1  Change in appetite 2  Feeling bad or failure about yourself  1  Trouble concentrating 1  Moving slowly or fidgety/restless 0  Suicidal thoughts 0  PHQ-9 Score 8  Difficult doing work/chores Somewhat difficult       Past Medical History:  Diagnosis Date  . Arthritis   . Fibromyalgia   . Headache(784.0)    HX MIGRAINES  . Hypertension   . Sleep apnea    HAS NOT USED C PAP SINCE 2007 DUE TO WT LOSS     Past Surgical History:  Procedure Laterality Date  . ABDOMINAL HYSTERECTOMY  1997  . BUNIONECTOMY  2012  . CESAREAN SECTION    . GASTRIC BYPASS  2006  . JOINT REPLACEMENT     RTKA 7'15  . 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 TOTAL KNEE  ARTHROPLASTY Left 04/13/2014   Procedure: LEFT TOTAL KNEE ARTHROPLASTY;  Surgeon: Ranee Gosselin, MD;  Location: [facility];  Service: Orthopedics;  Laterality: Left;    No family history on file.  Social History   Socioeconomic History  . Marital status: Married    Spouse name: Not on file  . Number of children: Not on file  . Years of education: Not on file  . Highest education level: Not on file  Occupational History  . Not on file  Social Needs  . Financial resource strain: Not on file  . Food insecurity:    Worry: Not on file    Inability: Not on file  . Transportation needs:    Medical: Not on file    Non-medical: Not on file  Tobacco Use  . Smoking status: Never Smoker  Substance and Sexual Activity  . Alcohol use: No  . Drug use: No  . Sexual activity: Not on file  Lifestyle  . Physical activity:    Days per week: Not on file    Minutes per session: Not on file  . Stress: Not on file  Relationships  . Social connections:    Talks on phone: Not  on file    Gets together: Not on file    Attends religious service: Not on file    Active member of club or organization: Not on file    Attends meetings of clubs or organizations: Not on file    Relationship status: Not on file  . Intimate partner violence:    Fear of current or ex partner: Not on file    Emotionally abused: Not on file    Physically abused: Not on file    Forced sexual activity: Not on file  Other Topics Concern  . Not on file  Social History Narrative  . Not on file    ROS Review of Systems Review of Systems  Constitutional: Negative for activity change, appetite change, chills and fever.  HENT: Negative for congestion, nosebleeds, trouble swallowing and voice change.   Respiratory: Negative for cough, shortness of breath and wheezing.   Gastrointestinal: Negative for diarrhea, nausea and vomiting.  Genitourinary: + constant bladder leakage s/p bladder repair Musculoskeletal: Negative for back  pain, joint swelling and neck pain.  Neurological: Negative for dizziness, speech difficulty, light-headedness and numbness.  See HPI. All other review of systems negative.   Objective:   Today's Vitals: BP 102/68 (BP Location: Right Arm, Patient Position: Sitting, Cuff Size: Normal)   Pulse 63   Temp 97.9 F (36.6 C) (Oral)   Ht 5' (1.524 m)   Wt 255 lb 9.6 oz (115.9 kg)   SpO2 100%   BMI 49.92 kg/m   Physical Exam  BP 102/68 (BP Location: Right Arm, Patient Position: Sitting, Cuff Size: Normal)   Pulse 63   Temp 97.9 F (36.6 C) (Oral)   Ht 5' (1.524 m)   Wt 255 lb 9.6 oz (115.9 kg)   SpO2 100%   BMI 49.92 kg/m   General Appearance:    Alert, cooperative, no distress, appears stated age  Head:    Normocephalic, without obvious abnormality, atraumatic  Eyes:    PERRL, conjunctiva/corneas clear, EOM's intact,   Ears:    Normal TM's and external ear canals, both ears  Nose:   Nares normal, septum midline, mucosa normal, no drainage    or sinus tenderness  Throat:   Lips, mucosa, and tongue normal; teeth and gums normal  Neck:   Supple, symmetrical, trachea midline, no adenopathy;    thyroid:  no enlargement/tenderness/nodules; no carotid   bruit or JVD  Back:     Symmetric, no curvature, ROM normal, no CVA tenderness  Lungs:     Clear to auscultation bilaterally, respirations unlabored  Chest Wall:    No tenderness or deformity   Heart:    Regular rate and rhythm, S1 and S2 normal, no murmur, rub   or gallop  Breast Exam:    No tenderness, masses, or nipple abnormality  Abdomen:     Soft, non-tender, bowel sounds active all four quadrants,    no masses, no organomegaly  Genitalia:    Normal female without lesion, white vaginal discharge without pelvic pain on exam, uterus surgically absent  Extremities:   Extremities normal, atraumatic, no cyanosis or edema  Pulses:   2+ and symmetric all extremities  Skin:   Skin color, texture, turgor normal, no rashes or lesions    Lymph nodes:   Cervical, supraclavicular, and axillary nodes normal  Neurologic:   CNII-XII intact, normal strength, sensation and reflexes    throughout    Assessment & Plan:   Problem List Items Addressed This Visit  None    Visit Diagnoses    Prediabetes    -  Primary   Relevant Orders   Hemoglobin A1c   COMPLETE METABOLIC PANEL WITH GFR   Pure hypercholesterolemia       Relevant Medications   amLODipine (NORVASC) 5 MG tablet   Other Relevant Orders   Lipid panel   COMPLETE METABOLIC PANEL WITH GFR   Encounter for health maintenance examination in adult       Encounter for screening for human papillomavirus (HPV)       Relevant Orders   Pap IG, CT/NG NAA, and HPV (high risk)   Vaginal Pap smear       Relevant Orders   Pap IG, CT/NG NAA, and HPV (high risk)   Intertrigo       Relevant Orders   POCT Wet + KOH Prep      Outpatient Encounter Medications as of 03/19/2018  Medication Sig  . amLODipine (NORVASC) 5 MG tablet Norvasc 5 mg tablet  Take 1 tablet every day by oral route.  . ferrous sulfate 325 (65 FE) MG tablet Take 1 tablet (325 mg total) by mouth 3 (three) times daily after meals.  . hydrochlorothiazide (HYDRODIURIL) 25 MG tablet Take 25 mg by mouth every morning.  . SUMAtriptan (IMITREX) 100 MG tablet Take 100 mg by mouth every 2 (two) hours as needed for migraine or headache. May repeat in 2 hours if headache persists or recurs.  . traMADol (ULTRAM) 50 MG tablet tramadol 50 mg tablet  TAKE 1 TO 2 TABLETS BY MOUTH EVERY DAY AS NEEDED  . UNABLE TO FIND butalbital-aspirin-caffeine 50 mg-325 mg-40 mg capsule  . clotrimazole-betamethasone (LOTRISONE) cream Apply 1 application topically 2 (two) times daily.  . [DISCONTINUED] docusate sodium (COLACE) 100 MG capsule Take 1 capsule (100 mg total) by mouth 2 (two) times daily.  . [DISCONTINUED] DULoxetine (CYMBALTA) 30 MG capsule Take 30 mg by mouth at bedtime.  . [DISCONTINUED] HYDROmorphone (DILAUDID) 2 MG tablet  Take 1-2 tablets (2-4 mg total) by mouth every 4 (four) hours as needed for moderate pain or severe pain.  . [DISCONTINUED] methocarbamol (ROBAXIN) 500 MG tablet Take 1 tablet (500 mg total) by mouth every 6 (six) hours as needed for muscle spasms.  . [DISCONTINUED] omeprazole (PRILOSEC) 20 MG capsule Take 20 mg by mouth daily.  . [DISCONTINUED] propranolol ER (INDERAL LA) 60 MG 24 hr capsule Take 180 mg by mouth at bedtime.  . [DISCONTINUED] rivaroxaban (XARELTO) 10 MG TABS tablet Take 1 tablet (10 mg total) by mouth daily with breakfast.   No facility-administered encounter medications on file as of 03/19/2018.     Follow-up: No follow-ups on file.   Doristine Bosworth, MD

## 2018-03-24 LAB — PAP IG, CT-NG NAA, HPV HIGH-RISK
Chlamydia, Nuc. Acid Amp: NEGATIVE
Gonococcus by Nucleic Acid Amp: NEGATIVE
HPV, HIGH-RISK: NEGATIVE

## 2018-04-01 DIAGNOSIS — F411 Generalized anxiety disorder: Secondary | ICD-10-CM | POA: Diagnosis not present

## 2018-04-01 DIAGNOSIS — F4312 Post-traumatic stress disorder, chronic: Secondary | ICD-10-CM | POA: Diagnosis not present

## 2018-04-28 DIAGNOSIS — F4312 Post-traumatic stress disorder, chronic: Secondary | ICD-10-CM | POA: Diagnosis not present

## 2018-04-28 DIAGNOSIS — F411 Generalized anxiety disorder: Secondary | ICD-10-CM | POA: Diagnosis not present

## 2018-05-12 DIAGNOSIS — F4312 Post-traumatic stress disorder, chronic: Secondary | ICD-10-CM | POA: Diagnosis not present

## 2018-05-12 DIAGNOSIS — F411 Generalized anxiety disorder: Secondary | ICD-10-CM | POA: Diagnosis not present

## 2018-05-26 DIAGNOSIS — F411 Generalized anxiety disorder: Secondary | ICD-10-CM | POA: Diagnosis not present

## 2018-05-26 DIAGNOSIS — F4312 Post-traumatic stress disorder, chronic: Secondary | ICD-10-CM | POA: Diagnosis not present

## 2018-05-28 DIAGNOSIS — M7071 Other bursitis of hip, right hip: Secondary | ICD-10-CM | POA: Diagnosis not present

## 2018-05-28 DIAGNOSIS — Z6841 Body Mass Index (BMI) 40.0 and over, adult: Secondary | ICD-10-CM | POA: Diagnosis not present

## 2018-05-28 DIAGNOSIS — M5126 Other intervertebral disc displacement, lumbar region: Secondary | ICD-10-CM | POA: Diagnosis not present

## 2018-05-28 DIAGNOSIS — M5417 Radiculopathy, lumbosacral region: Secondary | ICD-10-CM | POA: Diagnosis not present

## 2018-06-08 DIAGNOSIS — F4312 Post-traumatic stress disorder, chronic: Secondary | ICD-10-CM | POA: Diagnosis not present

## 2018-06-08 DIAGNOSIS — F411 Generalized anxiety disorder: Secondary | ICD-10-CM | POA: Diagnosis not present

## 2018-06-09 DIAGNOSIS — J309 Allergic rhinitis, unspecified: Secondary | ICD-10-CM | POA: Diagnosis not present

## 2018-06-09 DIAGNOSIS — B370 Candidal stomatitis: Secondary | ICD-10-CM | POA: Diagnosis not present

## 2018-06-09 DIAGNOSIS — G43909 Migraine, unspecified, not intractable, without status migrainosus: Secondary | ICD-10-CM | POA: Diagnosis not present

## 2018-06-09 DIAGNOSIS — I10 Essential (primary) hypertension: Secondary | ICD-10-CM | POA: Diagnosis not present

## 2018-06-11 DIAGNOSIS — Z6841 Body Mass Index (BMI) 40.0 and over, adult: Secondary | ICD-10-CM | POA: Diagnosis not present

## 2018-06-11 DIAGNOSIS — M7918 Myalgia, other site: Secondary | ICD-10-CM | POA: Diagnosis not present

## 2018-06-24 DIAGNOSIS — F4312 Post-traumatic stress disorder, chronic: Secondary | ICD-10-CM | POA: Diagnosis not present

## 2018-06-24 DIAGNOSIS — F411 Generalized anxiety disorder: Secondary | ICD-10-CM | POA: Diagnosis not present

## 2018-07-15 DIAGNOSIS — F411 Generalized anxiety disorder: Secondary | ICD-10-CM | POA: Diagnosis not present

## 2018-07-15 DIAGNOSIS — F4312 Post-traumatic stress disorder, chronic: Secondary | ICD-10-CM | POA: Diagnosis not present

## 2018-07-20 DIAGNOSIS — M533 Sacrococcygeal disorders, not elsewhere classified: Secondary | ICD-10-CM | POA: Diagnosis not present

## 2018-07-20 DIAGNOSIS — M545 Low back pain: Secondary | ICD-10-CM | POA: Diagnosis not present

## 2018-07-20 DIAGNOSIS — Z6841 Body Mass Index (BMI) 40.0 and over, adult: Secondary | ICD-10-CM | POA: Diagnosis not present

## 2018-07-28 DIAGNOSIS — F4312 Post-traumatic stress disorder, chronic: Secondary | ICD-10-CM | POA: Diagnosis not present

## 2018-07-28 DIAGNOSIS — F411 Generalized anxiety disorder: Secondary | ICD-10-CM | POA: Diagnosis not present

## 2018-08-10 DIAGNOSIS — F4312 Post-traumatic stress disorder, chronic: Secondary | ICD-10-CM | POA: Diagnosis not present

## 2018-08-10 DIAGNOSIS — F411 Generalized anxiety disorder: Secondary | ICD-10-CM | POA: Diagnosis not present

## 2018-08-24 DIAGNOSIS — G43909 Migraine, unspecified, not intractable, without status migrainosus: Secondary | ICD-10-CM | POA: Diagnosis not present

## 2018-08-24 DIAGNOSIS — I10 Essential (primary) hypertension: Secondary | ICD-10-CM | POA: Diagnosis not present

## 2018-08-24 DIAGNOSIS — M4310 Spondylolisthesis, site unspecified: Secondary | ICD-10-CM | POA: Diagnosis not present

## 2018-08-24 DIAGNOSIS — Z760 Encounter for issue of repeat prescription: Secondary | ICD-10-CM | POA: Diagnosis not present

## 2018-09-02 DIAGNOSIS — F411 Generalized anxiety disorder: Secondary | ICD-10-CM | POA: Diagnosis not present

## 2018-09-02 DIAGNOSIS — F4312 Post-traumatic stress disorder, chronic: Secondary | ICD-10-CM | POA: Diagnosis not present

## 2018-09-04 ENCOUNTER — Other Ambulatory Visit: Payer: Self-pay | Admitting: *Deleted

## 2018-09-04 DIAGNOSIS — Z20822 Contact with and (suspected) exposure to covid-19: Secondary | ICD-10-CM

## 2018-09-06 LAB — NOVEL CORONAVIRUS, NAA: SARS-CoV-2, NAA: NOT DETECTED

## 2018-09-10 DIAGNOSIS — M7918 Myalgia, other site: Secondary | ICD-10-CM | POA: Diagnosis not present

## 2018-09-10 DIAGNOSIS — M7071 Other bursitis of hip, right hip: Secondary | ICD-10-CM | POA: Diagnosis not present

## 2018-09-10 DIAGNOSIS — M5126 Other intervertebral disc displacement, lumbar region: Secondary | ICD-10-CM | POA: Diagnosis not present

## 2018-09-10 DIAGNOSIS — M461 Sacroiliitis, not elsewhere classified: Secondary | ICD-10-CM | POA: Diagnosis not present

## 2018-09-18 DIAGNOSIS — R7303 Prediabetes: Secondary | ICD-10-CM | POA: Diagnosis not present

## 2018-09-18 DIAGNOSIS — B370 Candidal stomatitis: Secondary | ICD-10-CM | POA: Diagnosis not present

## 2018-09-18 DIAGNOSIS — R351 Nocturia: Secondary | ICD-10-CM | POA: Diagnosis not present

## 2018-09-22 DIAGNOSIS — F411 Generalized anxiety disorder: Secondary | ICD-10-CM | POA: Diagnosis not present

## 2018-09-22 DIAGNOSIS — F4312 Post-traumatic stress disorder, chronic: Secondary | ICD-10-CM | POA: Diagnosis not present

## 2018-09-22 DIAGNOSIS — E119 Type 2 diabetes mellitus without complications: Secondary | ICD-10-CM | POA: Insufficient documentation

## 2018-09-25 DIAGNOSIS — E119 Type 2 diabetes mellitus without complications: Secondary | ICD-10-CM | POA: Diagnosis not present

## 2018-09-26 DIAGNOSIS — R109 Unspecified abdominal pain: Secondary | ICD-10-CM | POA: Diagnosis not present

## 2018-09-26 DIAGNOSIS — K5900 Constipation, unspecified: Secondary | ICD-10-CM | POA: Diagnosis not present

## 2018-10-07 DIAGNOSIS — F4312 Post-traumatic stress disorder, chronic: Secondary | ICD-10-CM | POA: Diagnosis not present

## 2018-10-07 DIAGNOSIS — F411 Generalized anxiety disorder: Secondary | ICD-10-CM | POA: Diagnosis not present

## 2018-10-19 DIAGNOSIS — Z792 Long term (current) use of antibiotics: Secondary | ICD-10-CM | POA: Diagnosis not present

## 2018-10-28 DIAGNOSIS — F4312 Post-traumatic stress disorder, chronic: Secondary | ICD-10-CM | POA: Diagnosis not present

## 2018-10-28 DIAGNOSIS — F411 Generalized anxiety disorder: Secondary | ICD-10-CM | POA: Diagnosis not present

## 2018-11-05 DIAGNOSIS — M7071 Other bursitis of hip, right hip: Secondary | ICD-10-CM | POA: Diagnosis not present

## 2018-11-05 DIAGNOSIS — I10 Essential (primary) hypertension: Secondary | ICD-10-CM | POA: Diagnosis not present

## 2018-11-05 DIAGNOSIS — Z6841 Body Mass Index (BMI) 40.0 and over, adult: Secondary | ICD-10-CM | POA: Diagnosis not present

## 2018-11-17 DIAGNOSIS — F411 Generalized anxiety disorder: Secondary | ICD-10-CM | POA: Diagnosis not present

## 2018-11-17 DIAGNOSIS — F4312 Post-traumatic stress disorder, chronic: Secondary | ICD-10-CM | POA: Diagnosis not present

## 2018-11-18 DIAGNOSIS — G43909 Migraine, unspecified, not intractable, without status migrainosus: Secondary | ICD-10-CM | POA: Diagnosis not present

## 2018-11-18 DIAGNOSIS — I10 Essential (primary) hypertension: Secondary | ICD-10-CM | POA: Diagnosis not present

## 2018-11-18 DIAGNOSIS — K5900 Constipation, unspecified: Secondary | ICD-10-CM | POA: Diagnosis not present

## 2018-11-18 DIAGNOSIS — Z760 Encounter for issue of repeat prescription: Secondary | ICD-10-CM | POA: Diagnosis not present

## 2018-11-19 DIAGNOSIS — Z23 Encounter for immunization: Secondary | ICD-10-CM | POA: Diagnosis not present

## 2018-11-20 ENCOUNTER — Other Ambulatory Visit: Payer: Self-pay | Admitting: Family Medicine

## 2018-11-20 DIAGNOSIS — Z1231 Encounter for screening mammogram for malignant neoplasm of breast: Secondary | ICD-10-CM

## 2018-11-23 ENCOUNTER — Other Ambulatory Visit: Payer: Self-pay | Admitting: Family Medicine

## 2018-11-23 ENCOUNTER — Ambulatory Visit
Admission: RE | Admit: 2018-11-23 | Discharge: 2018-11-23 | Disposition: A | Discharge status: Home / Self Care | Payer: BC Managed Care – PPO | Source: Ambulatory Visit | Attending: Family Medicine | Admitting: Family Medicine

## 2018-11-23 ENCOUNTER — Other Ambulatory Visit: Payer: Self-pay

## 2018-11-23 DIAGNOSIS — N200 Calculus of kidney: Secondary | ICD-10-CM | POA: Diagnosis not present

## 2018-11-23 DIAGNOSIS — R109 Unspecified abdominal pain: Secondary | ICD-10-CM | POA: Diagnosis not present

## 2018-11-23 DIAGNOSIS — R1084 Generalized abdominal pain: Secondary | ICD-10-CM

## 2018-11-23 DIAGNOSIS — N281 Cyst of kidney, acquired: Secondary | ICD-10-CM | POA: Diagnosis not present

## 2018-11-26 DIAGNOSIS — K439 Ventral hernia without obstruction or gangrene: Secondary | ICD-10-CM | POA: Diagnosis not present

## 2018-11-27 DIAGNOSIS — E119 Type 2 diabetes mellitus without complications: Secondary | ICD-10-CM | POA: Diagnosis not present

## 2018-11-27 DIAGNOSIS — Z0000 Encounter for general adult medical examination without abnormal findings: Secondary | ICD-10-CM | POA: Diagnosis not present

## 2018-12-02 ENCOUNTER — Ambulatory Visit: Payer: Self-pay | Admitting: Surgery

## 2018-12-08 DIAGNOSIS — F4312 Post-traumatic stress disorder, chronic: Secondary | ICD-10-CM | POA: Diagnosis not present

## 2018-12-08 DIAGNOSIS — F411 Generalized anxiety disorder: Secondary | ICD-10-CM | POA: Diagnosis not present

## 2019-01-05 ENCOUNTER — Encounter (HOSPITAL_COMMUNITY): Payer: Self-pay

## 2019-01-05 NOTE — Progress Notes (Signed)
Kelsey Simmons [address] [place] - [address] [place] [address] Phone: [phone] Fax: [phone]     Your procedure is scheduled on Monday, December 21st, 2020.   Report to [facility] Main Entrance "A" at 11:00 A.M., and check in at the Admitting office.   Call this number if you have problems the morning of surgery:  [phone]  Call [phone] if you have any questions prior to your surgery date Monday-Friday 8am-4pm    Remember:  Do not eat or drink after midnight the night before your surgery    Take these medicines the morning of surgery with A SIP OF WATER :  Amlodipine (Norvasc) Sumatriptan (Imitrex) - if needed Tramadol (Ultram) - if needed  WHAT DO I DO ABOUT MY DIABETES MEDICATION?  Marland Kitchen The day of surgery, do not take other diabetes injectables, including Ozempic.   HOW TO MANAGE YOUR DIABETES BEFORE AND AFTER SURGERY  Why is it important to control my blood sugar before and after surgery? . Improving blood sugar levels before and after surgery helps healing and can limit problems. . A way of improving blood sugar control is eating a healthy diet by: o  Eating less sugar and carbohydrates o  Increasing activity/exercise o  Talking with your doctor about reaching your blood sugar goals . High blood sugars (greater than 180 mg/dL) can raise your risk of infections and slow your recovery, so you will need to focus on controlling your diabetes during the weeks before surgery. . Make sure that the doctor who takes care of your diabetes knows about your planned surgery including the date and location.  How do I manage my blood sugar before surgery? . Check your blood sugar at least 4 times a day, starting 2 days before surgery, to make sure that the level is not too high or low. . Check your blood sugar the morning of your surgery when you wake up and every 2 hours until you get to the Short Stay  unit. o If your blood sugar is less than 70 mg/dL, you will need to treat for low blood sugar: - Do not take insulin. - Treat a low blood sugar (less than 70 mg/dL) with  cup of clear juice (cranberry or apple), 4 glucose tablets, OR glucose gel. - Recheck blood sugar in 15 minutes after treatment (to make sure it is greater than 70 mg/dL). If your blood sugar is not greater than 70 mg/dL on recheck, call [phone] for further instructions. . Report your blood sugar to the short stay nurse when you get to Short Stay.  . If you are admitted to the hospital after surgery: o Your blood sugar will be checked by the staff and you will probably be given insulin after surgery (instead of oral diabetes medicines) to make sure you have good blood sugar levels. o The goal for blood sugar control after surgery is 80-180 mg/dL.  7 days prior to surgery STOP taking any Aspirin (unless otherwise instructed by your surgeon), Aleve, Naproxen, Ibuprofen, Motrin, Advil, Goody's, BC's, all herbal medications, fish oil, and all vitamins.    The Morning of Surgery  Do not wear jewelry, make-up or nail polish.  Do not wear lotions, powders, or perfumes/colognes, or deodorant  Do not shave 48 hours prior to surgery.  Men may shave face and neck.  Do not bring valuables to the hospital.  [facility] is not responsible for any belongings  or valuables.  If you are a smoker, DO NOT Smoke 24 hours prior to surgery  If you wear a CPAP at night please bring your mask, tubing, and machine the morning of surgery   Remember that you must have someone to transport you home after your surgery, and remain with you for 24 hours if you are discharged the same day.   Please bring cases for contacts, glasses, hearing aids, dentures or bridgework because it cannot be worn into surgery.    Leave your suitcase in the car.  After surgery it may be brought to your room.  For patients admitted to the hospital, discharge time  will be determined by your treatment team.  Patients discharged the day of surgery will not be allowed to drive home.    Special instructions:   [place]- Preparing For Surgery  Before surgery, you can play an important role. Because skin is not sterile, your skin needs to be as free of germs as possible. You can reduce the number of germs on your skin by washing with CHG (chlorahexidine gluconate) Soap before surgery.  CHG is an antiseptic cleaner which kills germs and bonds with the skin to continue killing germs even after washing.    Oral Hygiene is also important to reduce your risk of infection.  Remember - BRUSH YOUR TEETH THE MORNING OF SURGERY WITH YOUR REGULAR TOOTHPASTE  Please do not use if you have an allergy to CHG or antibacterial soaps. If your skin becomes reddened/irritated stop using the CHG.  Do not shave (including legs and underarms) for at least 48 hours prior to first CHG shower. It is OK to shave your face.  Please follow these instructions carefully.   1. Shower the NIGHT BEFORE SURGERY and the MORNING OF SURGERY with CHG Soap.   2. If you chose to wash your hair, wash your hair first as usual with your normal shampoo.  3. After you shampoo, rinse your hair and body thoroughly to remove the shampoo.  4. Use CHG as you would any other liquid soap. You can apply CHG directly to the skin and wash gently with a scrungie or a clean washcloth.   5. Apply the CHG Soap to your body ONLY FROM THE NECK DOWN.  Do not use on open wounds or open sores. Avoid contact with your eyes, ears, mouth and genitals (private parts). Wash Face and genitals (private parts)  with your normal soap.   6. Wash thoroughly, paying special attention to the area where your surgery will be performed.  7. Thoroughly rinse your body with warm water from the neck down.  8. DO NOT shower/wash with your normal soap after using and rinsing off the CHG Soap.  9. Pat yourself dry with a CLEAN  TOWEL.  10. Wear CLEAN PAJAMAS to bed the night before surgery, wear comfortable clothes the morning of surgery  11. Place CLEAN SHEETS on your bed the night of your first shower and DO NOT SLEEP WITH PETS.    Day of Surgery:  Please shower the morning of surgery with the CHG soap Do not apply any deodorants/lotions. Please wear clean clothes to the hospital/surgery center.   Remember to brush your teeth WITH YOUR REGULAR TOOTHPASTE.   Please read over the following fact sheets that you were given.

## 2019-01-06 ENCOUNTER — Other Ambulatory Visit: Payer: Self-pay

## 2019-01-06 ENCOUNTER — Encounter (HOSPITAL_COMMUNITY)
Admission: RE | Admit: 2019-01-06 | Discharge: 2019-01-06 | Disposition: A | Discharge status: Home / Self Care | Payer: BC Managed Care – PPO | Source: Ambulatory Visit | Attending: Surgery | Admitting: Surgery

## 2019-01-06 ENCOUNTER — Encounter (HOSPITAL_COMMUNITY): Payer: Self-pay

## 2019-01-06 DIAGNOSIS — E119 Type 2 diabetes mellitus without complications: Secondary | ICD-10-CM | POA: Insufficient documentation

## 2019-01-06 DIAGNOSIS — R9431 Abnormal electrocardiogram [ECG] [EKG]: Secondary | ICD-10-CM | POA: Diagnosis not present

## 2019-01-06 DIAGNOSIS — Z01818 Encounter for other preprocedural examination: Secondary | ICD-10-CM | POA: Diagnosis not present

## 2019-01-06 DIAGNOSIS — I10 Essential (primary) hypertension: Secondary | ICD-10-CM | POA: Insufficient documentation

## 2019-01-06 DIAGNOSIS — F411 Generalized anxiety disorder: Secondary | ICD-10-CM | POA: Diagnosis not present

## 2019-01-06 DIAGNOSIS — F4312 Post-traumatic stress disorder, chronic: Secondary | ICD-10-CM | POA: Diagnosis not present

## 2019-01-06 HISTORY — DX: Type 2 diabetes mellitus without complications: E11.9

## 2019-01-06 LAB — CBC
HCT: 43.3 % (ref 36.0–46.0)
Hemoglobin: 14.2 g/dL (ref 12.0–15.0)
MCH: 29.7 pg (ref 26.0–34.0)
MCHC: 32.8 g/dL (ref 30.0–36.0)
MCV: 90.6 fL (ref 80.0–100.0)
Platelets: 277 10*3/uL (ref 150–400)
RBC: 4.78 MIL/uL (ref 3.87–5.11)
RDW: 13.8 % (ref 11.5–15.5)
WBC: 8.7 10*3/uL (ref 4.0–10.5)
nRBC: 0 % (ref 0.0–0.2)

## 2019-01-06 LAB — BASIC METABOLIC PANEL
Anion gap: 13 (ref 5–15)
BUN: 11 mg/dL (ref 6–20)
CO2: 27 mmol/L (ref 22–32)
Calcium: 9.4 mg/dL (ref 8.9–10.3)
Chloride: 98 mmol/L (ref 98–111)
Creatinine, Ser: 0.93 mg/dL (ref 0.44–1.00)
GFR calc Af Amer: >60 mL/min (ref >60)
GFR calc non Af Amer: >60 mL/min (ref >60)
Glucose, Bld: 105 mg/dL — ABNORMAL HIGH (ref 70–99)
Potassium: 3.7 mmol/L (ref 3.5–5.1)
Sodium: 138 mmol/L (ref 135–145)

## 2019-01-06 LAB — HEMOGLOBIN A1C
Hgb A1c MFr Bld: 6.3 % — ABNORMAL HIGH (ref 4.8–5.6)
Mean Plasma Glucose: 134.11 mg/dL

## 2019-01-06 LAB — GLUCOSE, CAPILLARY: Glucose-Capillary: 61 mg/dL — ABNORMAL LOW (ref 70–99)

## 2019-01-06 NOTE — Progress Notes (Addendum)
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 yes/no: No   If the patient indicates "YES" to the above questions, their PAT will be rescheduled to limit the exposure to others and, the surgeon will be notified. THE PATIENT WILL NEED TO BE ASYMPTOMATIC FOR 14 DAYS.   If the patient is not experiencing any of these symptoms, the PAT nurse will instruct them to NOT bring anyone with them to their appointment since they may have these symptoms or traveled as well.   Please remind your patients and families that hospital visitation restrictions are in effect and the importance of the restrictions.   Anesthesia review: Yes, EKG review  Patient denies shortness of breath, fever, cough and chest pain at PAT appointment  All instructions explained to the patient, with a verbal understanding of the material. Patient agrees to go over the instructions while at home for a better understanding. Patient also instructed to self quarantine after being tested for COVID-19. The opportunity to ask questions was provided.

## 2019-01-06 NOTE — Progress Notes (Signed)
Patient w/CBG 61 in PAT today.  Given 4oz cranberry juice.  Patient states she "feels fine" and has been trying to figure out her new dx of Type II DM since 09/2018.  Asymptomatic throughout her stay in PAT.  Glucose on BMP 105 after cranberry juice.  Patient educated to monitor closely.

## 2019-01-07 ENCOUNTER — Other Ambulatory Visit (HOSPITAL_COMMUNITY)
Admission: RE | Admit: 2019-01-07 | Discharge: 2019-01-07 | Disposition: A | Discharge status: Home / Self Care | Payer: BC Managed Care – PPO | Source: Ambulatory Visit | Attending: Surgery | Admitting: Surgery

## 2019-01-07 DIAGNOSIS — Z20828 Contact with and (suspected) exposure to other viral communicable diseases: Secondary | ICD-10-CM | POA: Diagnosis not present

## 2019-01-07 DIAGNOSIS — Z01812 Encounter for preprocedural laboratory examination: Secondary | ICD-10-CM | POA: Insufficient documentation

## 2019-01-08 ENCOUNTER — Ambulatory Visit: Payer: Self-pay

## 2019-01-08 LAB — NOVEL CORONAVIRUS, NAA (HOSP ORDER, SEND-OUT TO REF LAB; TAT 18-24 HRS): SARS-CoV-2, NAA: NOT DETECTED

## 2019-01-11 ENCOUNTER — Ambulatory Visit (HOSPITAL_COMMUNITY): Payer: BC Managed Care – PPO | Admitting: Physician Assistant

## 2019-01-11 ENCOUNTER — Encounter (HOSPITAL_COMMUNITY)
Admission: RE | Disposition: A | Discharge status: Home / Self Care | Payer: Self-pay | Source: Home / Self Care | Attending: Surgery

## 2019-01-11 ENCOUNTER — Encounter (HOSPITAL_COMMUNITY): Payer: Self-pay | Admitting: Surgery

## 2019-01-11 ENCOUNTER — Ambulatory Visit (HOSPITAL_COMMUNITY)
Admission: RE | Admit: 2019-01-11 | Discharge: 2019-01-11 | Disposition: A | Discharge status: Home / Self Care | Payer: BC Managed Care – PPO | Attending: Surgery | Admitting: Surgery

## 2019-01-11 ENCOUNTER — Other Ambulatory Visit: Payer: Self-pay

## 2019-01-11 ENCOUNTER — Ambulatory Visit (HOSPITAL_COMMUNITY): Payer: BC Managed Care – PPO

## 2019-01-11 DIAGNOSIS — Z96653 Presence of artificial knee joint, bilateral: Secondary | ICD-10-CM | POA: Diagnosis not present

## 2019-01-11 DIAGNOSIS — M1990 Unspecified osteoarthritis, unspecified site: Secondary | ICD-10-CM | POA: Insufficient documentation

## 2019-01-11 DIAGNOSIS — K436 Other and unspecified ventral hernia with obstruction, without gangrene: Secondary | ICD-10-CM | POA: Diagnosis not present

## 2019-01-11 DIAGNOSIS — I10 Essential (primary) hypertension: Secondary | ICD-10-CM | POA: Diagnosis not present

## 2019-01-11 DIAGNOSIS — M797 Fibromyalgia: Secondary | ICD-10-CM | POA: Diagnosis not present

## 2019-01-11 DIAGNOSIS — F329 Major depressive disorder, single episode, unspecified: Secondary | ICD-10-CM | POA: Insufficient documentation

## 2019-01-11 DIAGNOSIS — Z9884 Bariatric surgery status: Secondary | ICD-10-CM | POA: Insufficient documentation

## 2019-01-11 DIAGNOSIS — G4730 Sleep apnea, unspecified: Secondary | ICD-10-CM | POA: Insufficient documentation

## 2019-01-11 DIAGNOSIS — E119 Type 2 diabetes mellitus without complications: Secondary | ICD-10-CM | POA: Insufficient documentation

## 2019-01-11 DIAGNOSIS — Z79899 Other long term (current) drug therapy: Secondary | ICD-10-CM | POA: Insufficient documentation

## 2019-01-11 DIAGNOSIS — Z6841 Body Mass Index (BMI) 40.0 and over, adult: Secondary | ICD-10-CM | POA: Diagnosis not present

## 2019-01-11 DIAGNOSIS — Z20828 Contact with and (suspected) exposure to other viral communicable diseases: Secondary | ICD-10-CM | POA: Diagnosis not present

## 2019-01-11 HISTORY — PX: VENTRAL HERNIA REPAIR: SHX424

## 2019-01-11 LAB — GLUCOSE, CAPILLARY
Glucose-Capillary: 98 mg/dL (ref 70–99)
Glucose-Capillary: 134 mg/dL — ABNORMAL HIGH (ref 70–99)

## 2019-01-11 SURGERY — REPAIR, HERNIA, VENTRAL, LAPAROSCOPIC
Anesthesia: General

## 2019-01-11 MED ORDER — PROPOFOL 10 MG/ML IV BOLUS
INTRAVENOUS | Status: DC | PRN
  Administered 2019-01-11: 150 mg via INTRAVENOUS
  Administered 2019-01-11: 50 mg via INTRAVENOUS

## 2019-01-11 MED ORDER — LABETALOL HCL 5 MG/ML IV SOLN
INTRAVENOUS | Status: AC
  Filled 2019-01-11: qty 4

## 2019-01-11 MED ORDER — BUPIVACAINE HCL (PF) 0.25 % IJ SOLN
INTRAMUSCULAR | Status: AC
  Filled 2019-01-11: qty 30

## 2019-01-11 MED ORDER — DOCUSATE SODIUM 100 MG PO CAPS: 100.0000 mg | ORAL_CAPSULE | Freq: Two times a day (BID) | ORAL | 2 refills | Status: DC

## 2019-01-11 MED ORDER — CHLORHEXIDINE GLUCONATE CLOTH 2 % EX PADS: 6.0000 | MEDICATED_PAD | Freq: Once | CUTANEOUS | Status: DC

## 2019-01-11 MED ORDER — FENTANYL CITRATE (PF) 250 MCG/5ML IJ SOLN
INTRAMUSCULAR | Status: DC | PRN
  Administered 2019-01-11: 150 ug via INTRAVENOUS
  Administered 2019-01-11 (×2): 50 ug via INTRAVENOUS

## 2019-01-11 MED ORDER — OXYCODONE HCL 5 MG PO TABS: 5.0000 mg | ORAL_TABLET | ORAL | 0 refills | Status: DC | PRN

## 2019-01-11 MED ORDER — MEPERIDINE HCL 25 MG/ML IJ SOLN: 6.2500 mg | INTRAMUSCULAR | Status: DC | PRN

## 2019-01-11 MED ORDER — PHENYLEPHRINE HCL-NACL 10-0.9 MG/250ML-% IV SOLN
INTRAVENOUS | Status: DC | PRN
  Administered 2019-01-11: 10 ug/min via INTRAVENOUS

## 2019-01-11 MED ORDER — ROCURONIUM BROMIDE 10 MG/ML (PF) SYRINGE
PREFILLED_SYRINGE | INTRAVENOUS | Status: DC | PRN
  Administered 2019-01-11 (×2): 10 mg via INTRAVENOUS
  Administered 2019-01-11: 60 mg via INTRAVENOUS

## 2019-01-11 MED ORDER — HYDROMORPHONE HCL 1 MG/ML IJ SOLN
INTRAMUSCULAR | Status: AC
  Administered 2019-01-11: 0.5 mg via INTRAVENOUS
  Filled 2019-01-11: qty 1

## 2019-01-11 MED ORDER — OXYCODONE HCL 5 MG PO TABS: 5.0000 mg | ORAL_TABLET | Freq: Once | ORAL | Status: AC

## 2019-01-11 MED ORDER — LIDOCAINE 2% (20 MG/ML) 5 ML SYRINGE
INTRAMUSCULAR | Status: AC
  Filled 2019-01-11: qty 5

## 2019-01-11 MED ORDER — OXYCODONE HCL 5 MG PO TABS
ORAL_TABLET | ORAL | Status: AC
  Administered 2019-01-11: 5 mg via ORAL
  Filled 2019-01-11: qty 1

## 2019-01-11 MED ORDER — ENOXAPARIN SODIUM 40 MG/0.4ML ~~LOC~~ SOLN: 40.0000 mg | Freq: Once | SUBCUTANEOUS | Status: AC

## 2019-01-11 MED ORDER — DEXAMETHASONE SODIUM PHOSPHATE 10 MG/ML IJ SOLN
INTRAMUSCULAR | Status: AC
  Filled 2019-01-11: qty 1

## 2019-01-11 MED ORDER — ONDANSETRON HCL 4 MG/2ML IJ SOLN
INTRAMUSCULAR | Status: DC | PRN
  Administered 2019-01-11: 4 mg via INTRAVENOUS

## 2019-01-11 MED ORDER — SCOPOLAMINE 1 MG/3DAYS TD PT72: 1.0000 | MEDICATED_PATCH | Freq: Once | TRANSDERMAL | Status: DC

## 2019-01-11 MED ORDER — MIDAZOLAM HCL 2 MG/2ML IJ SOLN: 0.5000 mg | Freq: Once | INTRAMUSCULAR | Status: DC | PRN

## 2019-01-11 MED ORDER — 0.9 % SODIUM CHLORIDE (POUR BTL) OPTIME
TOPICAL | Status: DC | PRN
  Administered 2019-01-11: 1000 mL

## 2019-01-11 MED ORDER — FENTANYL CITRATE (PF) 250 MCG/5ML IJ SOLN
INTRAMUSCULAR | Status: AC
  Filled 2019-01-11: qty 5

## 2019-01-11 MED ORDER — ROCURONIUM BROMIDE 10 MG/ML (PF) SYRINGE
PREFILLED_SYRINGE | INTRAVENOUS | Status: AC
  Filled 2019-01-11: qty 10

## 2019-01-11 MED ORDER — LIDOCAINE-EPINEPHRINE 0.5 %-1:200000 IJ SOLN
INTRAMUSCULAR | Status: AC
  Filled 2019-01-11: qty 1

## 2019-01-11 MED ORDER — LIDOCAINE-EPINEPHRINE 1 %-1:100000 IJ SOLN
INTRAMUSCULAR | Status: AC
  Filled 2019-01-11: qty 1

## 2019-01-11 MED ORDER — MIDAZOLAM HCL 2 MG/2ML IJ SOLN
INTRAMUSCULAR | Status: AC
  Filled 2019-01-11: qty 2

## 2019-01-11 MED ORDER — BUPIVACAINE HCL (PF) 0.25 % IJ SOLN
INTRAMUSCULAR | Status: DC | PRN
  Administered 2019-01-11: 30 mL

## 2019-01-11 MED ORDER — SCOPOLAMINE 1 MG/3DAYS TD PT72
MEDICATED_PATCH | TRANSDERMAL | Status: AC
  Administered 2019-01-11: 1.5 mg via TRANSDERMAL
  Filled 2019-01-11: qty 1

## 2019-01-11 MED ORDER — CEFAZOLIN SODIUM-DEXTROSE 2-4 GM/100ML-% IV SOLN
INTRAVENOUS | Status: AC
  Filled 2019-01-11: qty 100

## 2019-01-11 MED ORDER — LIDOCAINE 2% (20 MG/ML) 5 ML SYRINGE
INTRAMUSCULAR | Status: DC | PRN
  Administered 2019-01-11: 40 mg via INTRAVENOUS

## 2019-01-11 MED ORDER — DEXAMETHASONE SODIUM PHOSPHATE 10 MG/ML IJ SOLN
INTRAMUSCULAR | Status: DC | PRN
  Administered 2019-01-11: 10 mg via INTRAVENOUS

## 2019-01-11 MED ORDER — SUGAMMADEX SODIUM 200 MG/2ML IV SOLN
INTRAVENOUS | Status: DC | PRN
  Administered 2019-01-11: 200 mg via INTRAVENOUS

## 2019-01-11 MED ORDER — PROMETHAZINE HCL 25 MG/ML IJ SOLN: 6.2500 mg | INTRAMUSCULAR | Status: DC | PRN

## 2019-01-11 MED ORDER — ONDANSETRON HCL 4 MG/2ML IJ SOLN
INTRAMUSCULAR | Status: AC
  Filled 2019-01-11: qty 2

## 2019-01-11 MED ORDER — PROMETHAZINE HCL 25 MG/ML IJ SOLN
INTRAMUSCULAR | Status: AC
  Filled 2019-01-11: qty 1

## 2019-01-11 MED ORDER — CEFAZOLIN SODIUM-DEXTROSE 2-4 GM/100ML-% IV SOLN
2.0000 g | INTRAVENOUS | Status: AC
  Administered 2019-01-11: 2 g via INTRAVENOUS

## 2019-01-11 MED ORDER — PROPOFOL 10 MG/ML IV BOLUS
INTRAVENOUS | Status: AC
  Filled 2019-01-11: qty 20

## 2019-01-11 MED ORDER — BUPIVACAINE LIPOSOME 1.3 % IJ SUSP
20.0000 mL | Freq: Once | INTRAMUSCULAR | Status: DC
  Filled 2019-01-11: qty 20

## 2019-01-11 MED ORDER — LACTATED RINGERS IV SOLN: INTRAVENOUS | Status: DC

## 2019-01-11 MED ORDER — ENOXAPARIN SODIUM 40 MG/0.4ML ~~LOC~~ SOLN
SUBCUTANEOUS | Status: AC
  Administered 2019-01-11: 40 mg via SUBCUTANEOUS
  Filled 2019-01-11: qty 0.4

## 2019-01-11 MED ORDER — HYDROMORPHONE HCL 1 MG/ML IJ SOLN
0.2500 mg | INTRAMUSCULAR | Status: DC | PRN
  Administered 2019-01-11: 0.5 mg via INTRAVENOUS

## 2019-01-11 MED ORDER — MIDAZOLAM HCL 2 MG/2ML IJ SOLN
INTRAMUSCULAR | Status: DC | PRN
  Administered 2019-01-11: 2 mg via INTRAVENOUS

## 2019-01-11 SURGICAL SUPPLY — 44 items
ADH SKN CLS APL DERMABOND .7 (GAUZE/BANDAGES/DRESSINGS) ×1
APL PRP STRL LF DISP 70% ISPRP (MISCELLANEOUS) ×1
BLADE CLIPPER SURG (BLADE) IMPLANT
CANISTER SUCT 3000ML PPV (MISCELLANEOUS) IMPLANT
CHLORAPREP W/TINT 26 (MISCELLANEOUS) ×2 IMPLANT
COVER SURGICAL LIGHT HANDLE (MISCELLANEOUS) ×2 IMPLANT
DERMABOND ADVANCED (GAUZE/BANDAGES/DRESSINGS) ×1
DERMABOND ADVANCED .7 DNX12 (GAUZE/BANDAGES/DRESSINGS) ×1 IMPLANT
DEVICE SECURE STRAP 25 ABSORB (INSTRUMENTS) ×1 IMPLANT
DEVICE TROCAR PUNCTURE CLOSURE (ENDOMECHANICALS) ×1 IMPLANT
DRAPE LAPAROSCOPIC ABDOMINAL (DRAPES) ×2 IMPLANT
ELECT CAUTERY BLADE 6.4 (BLADE) IMPLANT
ELECT REM PT RETURN 9FT ADLT (ELECTROSURGICAL) ×2
ELECTRODE REM PT RTRN 9FT ADLT (ELECTROSURGICAL) ×1 IMPLANT
GAUZE 4X4 16PLY RFD (DISPOSABLE) ×2 IMPLANT
GLOVE BIO SURGEON STRL SZ 6.5 (GLOVE) ×3 IMPLANT
GLOVE BIOGEL PI IND STRL 6 (GLOVE) ×1 IMPLANT
GLOVE BIOGEL PI INDICATOR 6 (GLOVE) ×1
GOWN STRL REUS W/ TWL LRG LVL3 (GOWN DISPOSABLE) ×2 IMPLANT
GOWN STRL REUS W/TWL LRG LVL3 (GOWN DISPOSABLE) ×4
KIT BASIN OR (CUSTOM PROCEDURE TRAY) ×2 IMPLANT
KIT TURNOVER KIT B (KITS) ×2 IMPLANT
MESH VENTRALIGHT ST 4.5IN (Mesh General) ×1 IMPLANT
NDL HYPO 25GX1X1/2 BEV (NEEDLE) ×1 IMPLANT
NEEDLE HYPO 25GX1X1/2 BEV (NEEDLE) ×2 IMPLANT
NS IRRIG 1000ML POUR BTL (IV SOLUTION) ×2 IMPLANT
PACK GENERAL/GYN (CUSTOM PROCEDURE TRAY) ×2 IMPLANT
PAD ARMBOARD 7.5X6 YLW CONV (MISCELLANEOUS) ×2 IMPLANT
PENCIL SMOKE EVACUATOR (MISCELLANEOUS) ×2 IMPLANT
SET TUBE SMOKE EVAC HIGH FLOW (TUBING) ×1 IMPLANT
SLEEVE ENDOPATH XCEL 5M (ENDOMECHANICALS) ×2 IMPLANT
SUT ETHIBOND CT1 BRD #0 30IN (SUTURE) ×3 IMPLANT
SUT MNCRL AB 4-0 PS2 18 (SUTURE) ×3 IMPLANT
SUT NOVA NAB DX-16 0-1 5-0 T12 (SUTURE) ×2 IMPLANT
SUT NOVA NAB GS-21 0 18 T12 DT (SUTURE) ×2 IMPLANT
SUT PROLENE 0 CT 1 CR/8 (SUTURE) ×1 IMPLANT
SUT VIC AB 3-0 SH 27 (SUTURE) ×2
SUT VIC AB 3-0 SH 27X BRD (SUTURE) ×1 IMPLANT
SUT VICRYL 0 UR6 27IN ABS (SUTURE) ×1 IMPLANT
SYR CONTROL 10ML LL (SYRINGE) ×2 IMPLANT
TOWEL GREEN STERILE (TOWEL DISPOSABLE) ×2 IMPLANT
TOWEL GREEN STERILE FF (TOWEL DISPOSABLE) ×2 IMPLANT
TROCAR XCEL BLUNT TIP 100MML (ENDOMECHANICALS) ×1 IMPLANT
TROCAR XCEL NON-BLD 5MMX100MML (ENDOMECHANICALS) ×1 IMPLANT

## 2019-01-11 NOTE — Anesthesia Procedure Notes (Signed)
Procedure Name: Intubation Date/Time: 01/11/2019 12:39 PM Performed by: Larene Beach, CRNA Pre-anesthesia Checklist: Patient identified, Emergency Drugs available, Suction available and Patient being monitored Patient Re-evaluated:Patient Re-evaluated prior to induction Oxygen Delivery Method: Circle system utilized Preoxygenation: Pre-oxygenation with 100% oxygen Induction Type: IV induction Ventilation: Mask ventilation without difficulty Laryngoscope Size: Mac and 3 Grade View: Grade I Tube type: Oral Tube size: 7.0 mm Number of attempts: 1 Airway Equipment and Method: Stylet and Oral airway Placement Confirmation: ETT inserted through vocal cords under direct vision,  positive ETCO2 and breath sounds checked- equal and bilateral Secured at: 22 cm Tube secured with: Tape Dental Injury: Teeth and Oropharynx as per pre-operative assessment

## 2019-01-11 NOTE — Transfer of Care (Signed)
Immediate Anesthesia Transfer of Care Note  Patient: Kelsey Simmons  Procedure(s) Performed: LAPAROSCOPIC VENTRAL HERNIA REPAIR WITH MESH (N/A )  Patient Location: PACU  Anesthesia Type:General  Level of Consciousness: drowsy and patient cooperative  Airway & Oxygen Therapy: Patient Spontanous Breathing and Patient connected to nasal cannula oxygen  Post-op Assessment: Report given to RN, Post -op Vital signs reviewed and stable and Patient moving all extremities X 4  Post vital signs: Reviewed and stable  Last Vitals:  Vitals Value Taken Time  BP 132/72 01/11/19 1428  Temp    Pulse 93 01/11/19 1430  Resp 19 01/11/19 1430  SpO2 95 % 01/11/19 1430  Vitals shown include unvalidated device data.  Last Pain:  Vitals:   01/11/19 1128  TempSrc:   PainSc: 6       Patients Stated Pain Goal: 4 (72/62/03 5597)  Complications: No apparent anesthesia complications

## 2019-01-11 NOTE — Anesthesia Postprocedure Evaluation (Signed)
Anesthesia Post Note  Patient: Kelsey Simmons  Procedure(s) Performed: LAPAROSCOPIC VENTRAL HERNIA REPAIR WITH MESH (N/A )     Patient location during evaluation: PACU Anesthesia Type: General Level of consciousness: awake and alert, patient cooperative and oriented Pain management: pain level controlled (pain improving) Vital Signs Assessment: post-procedure vital signs reviewed and stable Respiratory status: spontaneous breathing, nonlabored ventilation and respiratory function stable Cardiovascular status: blood pressure returned to baseline and stable Postop Assessment: no apparent nausea or vomiting Anesthetic complications: no    Last Vitals:  Vitals:   01/11/19 1545 01/11/19 1600  BP: 125/76   Pulse: 92   Resp: 15   Temp:  36.4 C  SpO2: 99%     Last Pain:  Vitals:   01/11/19 1600  TempSrc:   PainSc: 6                  Maveryk Renstrom,E. Hanni Milford

## 2019-01-11 NOTE — Anesthesia Preprocedure Evaluation (Addendum)
Anesthesia Evaluation  Patient identified by MRN, date of birth, ID band Patient awake    Reviewed: Allergy & Precautions, NPO status , Patient's Chart, lab work & pertinent test results  History of Anesthesia Complications Negative for: history of anesthetic complications  Airway Mallampati: II  TM Distance: >3 FB Neck ROM: Full    Dental  (+) Dental Advisory Given   Pulmonary sleep apnea (no longer uses CPAP) ,  01/07/2019 SARS CoV2 NEG   breath sounds clear to auscultation       Cardiovascular hypertension, Pt. on medications (-) angina Rhythm:Regular Rate:Normal     Neuro/Psych  Headaches, Depression    GI/Hepatic S/p gastric bypass   Endo/Other  diabetes (glu 98)Morbid obesity  Renal/GU      Musculoskeletal  (+) Arthritis , Fibromyalgia -  Abdominal (+) + obese,   Peds  Hematology negative hematology ROS (+)   Anesthesia Other Findings   Reproductive/Obstetrics                            Anesthesia Physical Anesthesia Plan  ASA: III  Anesthesia Plan: General   Post-op Pain Management:    Induction: Intravenous  PONV Risk Score and Plan: 3 and Ondansetron, Dexamethasone and Treatment may vary due to age or medical condition  Airway Management Planned: Oral ETT  Additional Equipment: None  Intra-op Plan:   Post-operative Plan: Extubation in OR  Informed Consent: I have reviewed the patients History and Physical, chart, labs and discussed the procedure including the risks, benefits and alternatives for the proposed anesthesia with the patient or authorized representative who has indicated his/her understanding and acceptance.     Dental advisory given  Plan Discussed with: Surgeon and CRNA  Anesthesia Plan Comments:       Anesthesia Quick Evaluation

## 2019-01-11 NOTE — Discharge Instructions (Addendum)
May shower beginning 01/12/2019. Do not peel off or scrub skin glue. May allow warm soapy water to run over incision, then rinse and pat dry. Do not soak in any water (tubs, hot tubs, pools, lakes, oceans) for one week.   No lifting greater than 5 pounds for six weeks. AVOID CONSTIPATION by taking prescribed stool softeners twice daily.  Call the office at [phone] for temperature greater than 101.13F, worsening pain, redness or warmth at the incision site.  Please call [phone] to make an appointment for 1 week after surgery for wound check.

## 2019-01-11 NOTE — Op Note (Signed)
   Operative Note   Date: 01/11/2019  Procedure: laparoscopic ventral hernia repair, incarcerated   Pre-op diagnosis: supraumbilical ventral hernia, incarcerated  Post-op diagnosis: same   Indication and clinical history: The patient is a 58 y.o. year old female with an incarcerated supraumbilical hernia.   Surgeon: Jesusita Oka, MD Assistant: Ralene Ok, MD  Anesthesiologist: Dr. Iona Hansen Anesthesia: General  Findings:  Specimen: none EBL: 15cc Drains/Implants: 11cm diameter permanent mesh  Disposition: PACU - hemodynamically stable.  Description of procedure: The patient was positioned supine on the operating room table. Time-out was performed verifying correct patient, procedure, signature of informed consent, and administration of pre-operative antibiotics, VTE prophylaxis with low molecular weight heparin. General anesthetic induction and intubation were uneventful. The patient was prepped and draped in the usual sterile fashion.   Abdominal entry was gained at the umbilicus using an open Hassan technique. The hernia was identified in the midline and two additional 8mm ports placed in the left lower quadrant. The hernia sac contained fat, which was reduced, and the hernia sac excised off the fascial edges. The hernia was primarily repaired using transfascial ethibond sutures in an interrupted fashion. Next, an 11cm circular permanent mesh was inserted into the abdomen after orienting it with the smooth side facing the abdominal contents and placement of circumferential sutures. After insertion to cover the hernia, the previously placed sutures were secured in a transfascial manner. Absorbable tacks were applied to the edges of the mesh to ensure good apposition to the abdominal wall. The abdomen was desufflated and the mesh was visualized remaining in good position. All ports were removed. The fascial of the umbilical port site was closed with 0 vicryl suture. The  skin of all port sites was closed with 4-0 monocryl.   Sterile dressings were applied using dermabond. All sponge and instrument counts were correct at the conclusion of the procedure. The patient was awakened from anesthesia, extubated uneventfully, and transported to the PACU in good condition. There were no complications.    Jesusita Oka, MD General and [facility]

## 2019-01-11 NOTE — H&P (Signed)
    TANIJA GERMANI is an 58 y.o. female.   HPI: 24Q with supraumbilical ventral hernia presents for surgical intervention.   Past Medical History:  Diagnosis Date  . Arthritis   . Depression 2020   in therapy but not on medications  . Diabetes mellitus without complication ([place])   . Fibromyalgia   . Headache(784.0)    HX MIGRAINES  . History of kidney stones 2020   left parenchymal stone identified on CT 11/23/18  . Hypertension   . Sleep apnea    HAS NOT USED C PAP SINCE 2007 DUE TO WT LOSS     Past Surgical History:  Procedure Laterality Date  . ABDOMINAL HYSTERECTOMY  1997  . BUNIONECTOMY  2012  . CESAREAN SECTION    . GASTRIC BYPASS  2006  . JOINT REPLACEMENT     RTKA 7'15  . TOTAL KNEE ARTHROPLASTY Right 08/18/2013   Procedure: RIGHT TOTAL KNEE ARTHROPLASTY;  Surgeon: Tobi Bastos, MD;  Location: [facility];  Service: Orthopedics;  Laterality: Right;  . TOTAL KNEE ARTHROPLASTY Left 04/13/2014   Procedure: LEFT TOTAL KNEE ARTHROPLASTY;  Surgeon: Latanya Maudlin, MD;  Location: [facility];  Service: Orthopedics;  Laterality: Left;    History reviewed. No pertinent family history.  Social History:  reports that she has never smoked. She has never used smokeless tobacco. She reports previous alcohol use. She reports previous drug use.  Allergies: No Known Allergies  Medications: I have reviewed the patient's current medications.  No results found for this or any previous visit (from the past 48 hour(s)).  No results found.  ROS 10 point review of systems is negative except as listed above in HPI.   Physical Exam Blood pressure (!) 129/58, pulse 71, temperature 98.1 F (36.7 C), temperature source Oral, resp. rate 18, height 5\' 1"  (1.549 m), weight 110.7 kg, SpO2 100 %. Physical Exam Gen: comfortable, no distress Neuro: non-focal exam HEENT: PERRL Neck: supple CV: RRR Pulm: unlabored breathing Abd: soft, NT Extr: wwp, no edema    Assessment/Plan: 69T with  supraumbilical ventral hernia presents for surgical repair. Plan for lap VHR with mesh. Risks clearly explained to the patient including bleeding, recurrence and mesh infection requiring explantation. Informed consent obtained. Husband, Lucious Acton, to be called post-procedure at [phone].   Jesusita Oka, MD General and [facility]

## 2019-01-12 ENCOUNTER — Encounter: Payer: Self-pay | Admitting: *Deleted

## 2019-01-25 ENCOUNTER — Encounter: Payer: Self-pay | Admitting: *Deleted

## 2019-01-26 DIAGNOSIS — M461 Sacroiliitis, not elsewhere classified: Secondary | ICD-10-CM | POA: Insufficient documentation

## 2019-02-01 DIAGNOSIS — F411 Generalized anxiety disorder: Secondary | ICD-10-CM | POA: Diagnosis not present

## 2019-02-01 DIAGNOSIS — F4312 Post-traumatic stress disorder, chronic: Secondary | ICD-10-CM | POA: Diagnosis not present

## 2019-02-09 DIAGNOSIS — R109 Unspecified abdominal pain: Secondary | ICD-10-CM | POA: Insufficient documentation

## 2019-02-09 DIAGNOSIS — M4310 Spondylolisthesis, site unspecified: Secondary | ICD-10-CM | POA: Insufficient documentation

## 2019-02-09 DIAGNOSIS — E7800 Pure hypercholesterolemia, unspecified: Secondary | ICD-10-CM | POA: Insufficient documentation

## 2019-02-09 DIAGNOSIS — J309 Allergic rhinitis, unspecified: Secondary | ICD-10-CM | POA: Insufficient documentation

## 2019-02-09 DIAGNOSIS — D509 Iron deficiency anemia, unspecified: Secondary | ICD-10-CM | POA: Insufficient documentation

## 2019-02-09 DIAGNOSIS — N6019 Diffuse cystic mastopathy of unspecified breast: Secondary | ICD-10-CM | POA: Insufficient documentation

## 2019-02-09 DIAGNOSIS — M797 Fibromyalgia: Secondary | ICD-10-CM | POA: Insufficient documentation

## 2019-02-09 DIAGNOSIS — G43109 Migraine with aura, not intractable, without status migrainosus: Secondary | ICD-10-CM | POA: Insufficient documentation

## 2019-02-09 DIAGNOSIS — K219 Gastro-esophageal reflux disease without esophagitis: Secondary | ICD-10-CM | POA: Insufficient documentation

## 2019-02-09 DIAGNOSIS — K5900 Constipation, unspecified: Secondary | ICD-10-CM | POA: Insufficient documentation

## 2019-02-13 DIAGNOSIS — Z20822 Contact with and (suspected) exposure to covid-19: Secondary | ICD-10-CM | POA: Insufficient documentation

## 2019-02-13 DIAGNOSIS — I10 Essential (primary) hypertension: Secondary | ICD-10-CM | POA: Diagnosis not present

## 2019-02-18 DIAGNOSIS — Z20828 Contact with and (suspected) exposure to other viral communicable diseases: Secondary | ICD-10-CM | POA: Diagnosis not present

## 2019-02-22 ENCOUNTER — Other Ambulatory Visit: Payer: Self-pay

## 2019-02-22 ENCOUNTER — Ambulatory Visit
Admission: RE | Admit: 2019-02-22 | Discharge: 2019-02-22 | Disposition: A | Discharge status: Home / Self Care | Payer: Self-pay | Source: Ambulatory Visit | Attending: Family Medicine | Admitting: Family Medicine

## 2019-02-22 DIAGNOSIS — Z1231 Encounter for screening mammogram for malignant neoplasm of breast: Secondary | ICD-10-CM

## 2019-03-04 ENCOUNTER — Encounter: Payer: Self-pay | Admitting: Family Medicine

## 2019-03-12 ENCOUNTER — Other Ambulatory Visit: Payer: Self-pay

## 2019-03-12 ENCOUNTER — Encounter: Payer: Self-pay | Admitting: Neurology

## 2019-03-12 ENCOUNTER — Ambulatory Visit: Payer: BC Managed Care – PPO | Admitting: Neurology

## 2019-03-12 VITALS — BP 124/78 | HR 87 | Resp 16 | Ht 61.0 in | Wt 240.6 lb

## 2019-03-12 DIAGNOSIS — G5713 Meralgia paresthetica, bilateral lower limbs: Secondary | ICD-10-CM | POA: Diagnosis not present

## 2019-03-12 MED ORDER — GABAPENTIN 300 MG PO CAPS: 600.0000 mg | ORAL_CAPSULE | Freq: Three times a day (TID) | ORAL | 0 refills | Status: DC

## 2019-03-12 NOTE — Patient Instructions (Addendum)
You have meralgia parsesthetica causing numbness/tingling over the outer thigh.  You may take gabapentin 600mg  three times daily Encouraged to loose weight

## 2019-03-12 NOTE — Progress Notes (Signed)
[facility] Clinic Note - Initial Visit   Date: 03/12/19  Kelsey Simmons MRN: [id number] DOB: [birth year]   Dear Kelsey Lye, NP:  Thank you for your kind referral of Kelsey Simmons for consultation of bilateral leg paresthesias. Although her history is well known to you, please allow [place] to reiterate it for the purpose of our medical record. The patient was accompanied to the clinic by self.   History of Present Illness: Kelsey Simmons is a 59 y.o. female with hypertension, migraine, diabetes mellitus, obesity s/p gastric bypass presenting for evaluation of bilateral thigh numbness/pain.   She has 10 -year history of numbness involving the lateral thigh, which was worse when standing and walking. Symptoms are constant.  She had seen Dr. Murray Simmons for chronic low back pain, who has offered a number of treatment options, which helped her low back pain, however and never had any benefit to the numbness in her thighs.  She denies any weakness.  She had hernia surgery in December 2020 and reports having episodic shock-like spells over the lateral thigh.  She takes gabapentin 400mg  three times daily, but she taking 600mg  3-4 times which helps.  She has history of obesity and underwent gastric bypass surgery in 2006, where she lost about 80 pounds, however since then has regained 55 pounds.  During this time, she has noticed that the numbness has been worse.  Out-side paper records, electronic medical record, and images have been reviewed where available and summarized as:  MRI lumbar spine wo contrast 08/28/2017: Central protrusion with posterior element hypertrophy at L5-S1.  BILATERAL S1 and RIGHT L5 neural impingement are likely. Progression from 2006.  Lab Results  Component Value Date   HGBA1C 6.3 (H) 01/06/2019   No results found for: VITAMINB12 No results found for: TSH No results found for: ESRSEDRATE, POCTSEDRATE  Past Medical History:  Diagnosis Date   . Arthritis   . Depression 2020   in therapy but not on medications  . Diabetes mellitus without complication ([facility])   . Fibromyalgia   . Headache(784.0)    HX MIGRAINES  . History of kidney stones 2020   left parenchymal stone identified on CT 11/23/18  . Hypertension   . Paresthesia of lower extremity   . Sleep apnea    HAS NOT USED C PAP SINCE 2007 DUE TO WT LOSS     Past Surgical History:  Procedure Laterality Date  . ABDOMINAL HYSTERECTOMY  1997  . BUNIONECTOMY  2012  . CESAREAN SECTION    . GASTRIC BYPASS  2006  . JOINT REPLACEMENT     RTKA 7'15  . TOTAL KNEE ARTHROPLASTY Right 08/18/2013   Procedure: RIGHT TOTAL KNEE ARTHROPLASTY;  Surgeon: 2007, MD;  Location: [facility];  Service: Orthopedics;  Laterality: Right;  . TOTAL KNEE ARTHROPLASTY Left 04/13/2014   Procedure: LEFT TOTAL KNEE ARTHROPLASTY;  Surgeon: Kelsey Cones, MD;  Location: [facility];  Service: Orthopedics;  Laterality: Left;  04/15/2014 VENTRAL HERNIA REPAIR N/A 01/11/2019   Procedure: LAPAROSCOPIC VENTRAL HERNIA REPAIR WITH MESH;  Surgeon: Kelsey Simmons Kitchen, MD;  Location: [facility];  Service: General;  Laterality: N/A;     Medications:  Outpatient Encounter Medications as of 03/12/2019  Medication Sig Note  . amLODipine (NORVASC) 5 MG tablet Take 5 mg by mouth daily.    Diamantina Monks amLODipine (NORVASC) 5 MG tablet Take by mouth.   . butalbital-aspirin-caffeine (FIORINAL) 50-325-40 MG capsule take 1 capsule by mouth every 4 hours if  needed   . cholecalciferol (VITAMIN D3) 25 MCG (1000 UT) tablet Take 1,000 Units by mouth daily.   . Cholecalciferol 25 MCG (1000 UT) tablet Take by mouth.   . cyanocobalamin 1000 MCG tablet Take by mouth.   . docusate sodium (COLACE) 100 MG capsule Take 1 capsule (100 mg total) by mouth 2 (two) times daily.   . ferrous sulfate 325 (65 FE) MG tablet Take 1 tablet (325 mg total) by mouth 3 (three) times daily after meals. (Patient taking differently: Take 325 mg by mouth daily with breakfast.  )   . gabapentin (NEURONTIN) 300 MG capsule Take 2 capsules (600 mg total) by mouth 3 (three) times daily.   . hydrochlorothiazide (HYDRODIURIL) 25 MG tablet Take 25 mg by mouth every morning.   . meloxicam (MOBIC) 15 MG tablet Take 15 mg by mouth daily.   . Multiple Vitamins-Iron (ONE DAILY MULTIVITAMIN/IRON PO) Take 1 tablet by mouth daily.   . naproxen (NAPROSYN) 500 MG tablet Take by mouth.   Kelsey Simmons Kitchen OZEMPIC, 0.25 OR 0.5 MG/DOSE, 2 MG/1.5ML SOPN Inject 0.5 mg into the skin every Monday.    . Semaglutide,0.25 or 0.5MG /DOS, (OZEMPIC, 0.25 OR 0.5 MG/DOSE,) 2 MG/1.5ML SOPN Inject into the skin.   . SUMAtriptan (IMITREX) 100 MG tablet Take 100 mg by mouth every 2 (two) hours as needed for migraine or headache. May repeat in 2 hours if headache persists or recurs.   . traMADol (ULTRAM) 50 MG tablet Take 50 mg by mouth every 6 (six) hours as needed for moderate pain.    Kelsey Simmons Kitchen triamcinolone cream (KENALOG) 0.1 % APPLY TO THE AFFECTED AREA TWICE TO THREE TIMES DAILY FOR 5 TO 7 DAYS   . vitamin B-12 (CYANOCOBALAMIN) 1000 MCG tablet Take 1,000 mcg by mouth daily.   . [DISCONTINUED] gabapentin (NEURONTIN) 300 MG capsule  03/12/2019: 600mg  every 8hrs  . [DISCONTINUED] gabapentin (NEURONTIN) 300 MG capsule    . [DISCONTINUED] hydrochlorothiazide (HYDRODIURIL) 25 MG tablet Take by mouth.   . [DISCONTINUED] meloxicam (MOBIC) 15 MG tablet Take by mouth.   . [DISCONTINUED] MULTIPLE VITAMINS-IRON PO Take by mouth.   . [DISCONTINUED] oxyCODONE (ROXICODONE) 5 MG immediate release tablet Take 1 tablet (5 mg total) by mouth every 4 (four) hours as needed for severe pain.   . [DISCONTINUED] SUMAtriptan (IMITREX) 50 MG tablet Take by mouth.   . [DISCONTINUED] traMADol (ULTRAM) 50 MG tablet Take by mouth.   . [DISCONTINUED] clotrimazole-betamethasone (LOTRISONE) cream Apply 1 application topically 2 (two) times daily. (Patient not taking: Reported on 01/01/2019)    No facility-administered encounter medications on file as of  03/12/2019.    Allergies:  Allergies  Allergen Reactions  . Topiramate     Other reaction(s): double vision  . Chlorhexidine Gluconate Rash  . Tape Rash    DERMABOND    Family History: History reviewed. No pertinent family history.  Social History: Social History   Tobacco Use  . Smoking status: Never Smoker  . Smokeless tobacco: Never Used  Substance Use Topics  . Alcohol use: Not Currently  . Drug use: Not Currently   Social History   Social History Narrative  . Not on file    Vital Signs:  BP 124/78 (BP Location: Left Arm, Patient Position: Sitting)   Pulse 87   Resp 16   Ht 5\' 1"  (1.549 m)   Wt 240 lb 9.6 oz (109.1 kg)   SpO2 97%   BMI 45.46 kg/m    Neurological Exam: MENTAL STATUS including orientation  to time, place, person, recent and remote memory, attention span and concentration, language, and fund of knowledge is normal.  Speech is not dysarthric.  CRANIAL NERVES: II:  No visual field defects.    III-IV-VI: Pupils equal round and reactive to light.  Normal conjugate, extra-ocular eye movements in all directions of gaze.  No nystagmus.  No ptosis.    VIII:  Normal hearing and vestibular function.   XI:  Normal shoulder shrug and head rotation.    MOTOR:  No atrophy, fasciculations or abnormal movements.  No pronator drift.   Upper Extremity:  Right  Left  Deltoid  5/5   5/5   Biceps  5/5   5/5   Triceps  5/5   5/5   Infraspinatus 5/5  5/5  Medial pectoralis 5/5  5/5  Wrist extensors  5/5   5/5   Wrist flexors  5/5   5/5   Finger extensors  5/5   5/5   Finger flexors  5/5   5/5   Dorsal interossei  5/5   5/5   Abductor pollicis  5/5   5/5   Tone (Ashworth scale)  0  0   Lower Extremity:  Right  Left  Hip flexors  5/5   5/5   Hip extensors  5/5   5/5   Adductor 5/5  5/5  Abductor 5/5  5/5  Knee flexors  5/5   5/5   Knee extensors  5/5   5/5   Dorsiflexors  5/5   5/5   Plantarflexors  5/5   5/5   Toe extensors  5/5   5/5   Toe flexors   5/5   5/5   Tone (Ashworth scale)  0  0   MSRs:  Right        Left                  brachioradialis 2+  2+  biceps 2+  2+  triceps 2+  2+  patellar 2+  2+  ankle jerk 2+  2+  Hoffman no  no  plantar response down  down   SENSORY: Reduced pinprick and temperature over the lateral femoral cutaneous nerve distribution bilaterally.  Vibration is intact throughout.     COORDINATION/GAIT: Normal finger-to- nose-finger.  Intact rapid alternating movements bilaterally.  Gait narrow based and stable. Tandem and stressed gait intact.    IMPRESSION: Bilateral meralgia paresthetica.  Her examination is consistent with diminished sensation in all modalities over the anterolateral thigh bilaterally side. Based on history of exam, she has meralgia paresthetica an entrapment of the lateral femoral cutaneous nerve as it exits below the inguinal canal. Management is conservative. Recent exacerbation of pain may be due to laying supine for procedure causing localized compression of the nerve.  With symptoms ongoing for 10+years, she likely has permanent nerve injury and symptoms will be lasting.   PLAN/RECOMMENDATIONS:  1. Continue neurontin 600mg  three times daily for pain - I have provided 30-day supply and she may request future refills from PCP 2. Avoid tight fitting clothing or belts  3. Weight loss encouraged  Return to clinic as needed  Thank you for allowing me to participate in patient's care.  If I can answer any additional questions, I would be pleased to do so.    Sincerely,    Leonette Tischer K. Posey Pronto, DO

## 2019-03-25 DIAGNOSIS — E7800 Pure hypercholesterolemia, unspecified: Secondary | ICD-10-CM | POA: Diagnosis not present

## 2019-03-25 DIAGNOSIS — E119 Type 2 diabetes mellitus without complications: Secondary | ICD-10-CM | POA: Diagnosis not present

## 2019-03-25 DIAGNOSIS — E559 Vitamin D deficiency, unspecified: Secondary | ICD-10-CM | POA: Diagnosis not present

## 2019-03-25 DIAGNOSIS — Z0000 Encounter for general adult medical examination without abnormal findings: Secondary | ICD-10-CM | POA: Diagnosis not present

## 2019-03-31 ENCOUNTER — Other Ambulatory Visit: Payer: Self-pay | Admitting: Family Medicine

## 2019-03-31 ENCOUNTER — Inpatient Hospital Stay: Admission: RE | Admit: 2019-03-31 | Payer: BC Managed Care – PPO | Source: Ambulatory Visit

## 2019-03-31 DIAGNOSIS — N83201 Unspecified ovarian cyst, right side: Secondary | ICD-10-CM

## 2019-04-01 ENCOUNTER — Other Ambulatory Visit: Payer: Self-pay

## 2019-04-01 ENCOUNTER — Ambulatory Visit
Admission: RE | Admit: 2019-04-01 | Discharge: 2019-04-01 | Disposition: A | Discharge status: Home / Self Care | Payer: BC Managed Care – PPO | Source: Ambulatory Visit | Attending: Family Medicine | Admitting: Family Medicine

## 2019-04-01 DIAGNOSIS — N83201 Unspecified ovarian cyst, right side: Secondary | ICD-10-CM

## 2019-04-01 DIAGNOSIS — N83202 Unspecified ovarian cyst, left side: Secondary | ICD-10-CM | POA: Diagnosis not present

## 2019-04-02 ENCOUNTER — Telehealth: Payer: Self-pay | Admitting: *Deleted

## 2019-04-02 NOTE — Telephone Encounter (Signed)
Called the patient and scheduled a new patient appt for 3/19. Gave the address and phone number for the clinic. Also gave the policy for masks, visitors and parking.

## 2019-04-05 NOTE — Progress Notes (Signed)
GYNECOLOGIC ONCOLOGY NEW PATIENT CONSULTATION   Patient Name: Kelsey Simmons  Patient Age: 59 y.o. Date of Service: 04/09/19 Referring Provider: Doristine Bosworth, MD [address] [place],  [place] [address]   Primary Care Provider: Doristine Bosworth, MD Consulting Provider: Eugene Garnet, MD   Assessment/Plan:  Postmenopausal patient with an enlarging complex adnexal mass.  Discussed possible etiologies of this mass including benign and malignant process. Overall, the patient is asymptomatic with a normal Ca1 25 and CT scan in December with no evidence of metastatic disease. My suspicion is that this mass is of benign etiology; however, given her menopausal status and its enlarging size, I recommend diagnostic and therapeutic treatment in the form of surgical excision.  The patient has an extensive surgical history including most recently repair of a ventral hernia with an 11 cm permanent mesh. We discussed the increased risk associated with surgery due to adhesions. I think it is feasible to attempt surgery robotically. If this is not possible, then I will use the patient's prior Pfannenstiel incision. We discussed plan for exploratory surgery with removal of the abnormal adnexa. If this is done robotically, we will place the ovary in a bag for contain drainage and removal. We will plan to send pathology while the patient is asleep for frozen section. If malignancy encountered, then I would proceed with staging to include lymphadenectomy, omentectomy, peritoneal biopsies and any other tumor debulking.  We discussed plan for a robotic assisted unilateral salpingo-oophorectomy, vision of any remnants of the contralateral tube and ovary, possible laparotomy, possible staging to include lymphadenectomy, omentectomy and tumor debulking. The risks of surgery were discussed in detail and she understands these to include infection; wound separation; hernia; vaginal cuff separation, injury to adjacent organs  such as bowel, bladder, blood vessels, ureters and nerves; bleeding which may require blood transfusion; anesthesia risk; thromboembolic events; possible death; unforeseen complications; possible need for re-exploration; medical complications such as heart attack, stroke, pleural effusion and pneumonia; and, if full lymphadenectomy is performed the risk of lymphedema and lymphocyst. The patient will receive DVT and antibiotic prophylaxis as indicated. She voiced a clear understanding. She had the opportunity to ask questions. Perioperative instructions were reviewed with her. Prescriptions for post-op medications were sent to her pharmacy of choice.  The patient is currently scheduled to get her Covid vaccine on Sunday. I recommend that she delay this for a week just in case she has any side effects so that we are not canceling surgery on the 29th.  A copy of this note was sent to the patient's referring provider.   45 minutes of total time was spent for this patient encounter, including preparation, face-to-face counseling with the patient and coordination of care, and documentation of the encounter.  Eugene Garnet, MD  Division of Gynecologic Oncology  Department of Obstetrics and Gynecology  [facility]  ___________________________________________  Chief Complaint: Chief Complaint  Patient presents with  . Cyst of left ovary    New Patient    History of Present Illness:  Kelsey Simmons is a 59 y.o. y.o. female who is seen in consultation at the request of Claude Manges FNP for an evaluation of an adnexal mass.  The patient reports that her pelvic mass was found at the time of CT scan for her ventral hernia. The plan with her primary care provider had been for follow-up imaging in several months. Given upcoming change in insurance, she recently had a repeat pelvic ultrasound which showed  increasing size of this adnexal mass. The patient denies any abdominal or  pelvic pain. She reports having a good appetite without any nausea or emesis. 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 She has lost approximately 16 pounds since surgery in December and is on Ozempic. She notes having daily bowel movements although has to be careful about her water intake so that she does not become constipated.  Last had a virtual visit with her primary care provider in early March  Her surgical history is notable for C-section, what sounds like a diagnostic laparoscopic surgery sometime after hysterectomy, total hysterectomy and USO, and most recently a laparoscopic ventral hernia repair for an incarcerated supraumbilical hernia on 01/11/19. Repaired with an 11 cm circular permanent mesh.   The patient uses a walker when she is going to be out and about and unsure whether she will have a place to sit. She cannot stand for long periods of time. She denies any chest pain or shortness of breath at rest. She has some shortness of breath with stairs or longer periods of ambulation.  PAST MEDICAL HISTORY:  Past Medical History:  Diagnosis Date  . Arthritis   . Depression 2020   in therapy but not on medications  . Diabetes mellitus without complication (HCC)   . Fibromyalgia   . Headache(784.0)    HX MIGRAINES  . History of kidney stones 2020   left parenchymal stone identified on CT 11/23/18  . Hypertension   . Paresthesia of lower extremity   . Sleep apnea    HAS NOT USED C PAP SINCE 2007 DUE TO WT LOSS      PAST SURGICAL HISTORY:  Past Surgical History:  Procedure Laterality Date  . ABDOMINAL HYSTERECTOMY  1997  . BUNIONECTOMY  2012  . CESAREAN SECTION    . GASTRIC BYPASS  2006  . JOINT REPLACEMENT     RTKA 7'15  . 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 TOTAL KNEE ARTHROPLASTY Left 04/13/2014    Procedure: LEFT TOTAL KNEE ARTHROPLASTY;  Surgeon: Ranee Gosselin, MD;  Location: [facility];  Service: Orthopedics;  Laterality: Left;  Marland Kitchen VENTRAL HERNIA REPAIR N/A 01/11/2019   Procedure: LAPAROSCOPIC VENTRAL HERNIA REPAIR WITH MESH;  Surgeon: Diamantina Monks, MD;  Location: [facility];  Service: General;  Laterality: N/A;    OB/GYN HISTORY:  OB History  No obstetric history on file.    No LMP recorded. Patient has had a hysterectomy.  Age at menarche: 88 Age at menopause: Hysterectomy at 34 (patient unsure why), denies any bleeding since. This was performed in 7 in [place]. She had hot flashes starting about 10 years ago. Hx of HRT: Denies Hx of STDs: No Last pap: 02/2018, negative; HPV negative History of abnormal pap smears: No  SCREENING STUDIES:  Last mammogram: 02/2019  Last bone mineral density: 2016  MEDICATIONS: Outpatient Encounter Medications as of 04/09/2019  Medication Sig  . amLODipine (NORVASC) 5 MG tablet Take 5 mg by mouth daily.   . butalbital-aspirin-caffeine (FIORINAL) 50-325-40 MG capsule take 1 capsule by mouth every 4 hours if needed  . docusate sodium (COLACE) 100 MG capsule Take 1 capsule (100 mg total) by mouth 2 (two) times daily.  Marland Kitchen gabapentin (NEURONTIN) 300 MG capsule Take 2 capsules (600 mg total) by mouth 3 (three) times daily.  Marland Kitchen  hydrochlorothiazide (HYDRODIURIL) 25 MG tablet Take 25 mg by mouth every morning.  . meloxicam (MOBIC) 15 MG tablet Take 15 mg by mouth daily.  Marland Kitchen OZEMPIC, 0.25 OR 0.5 MG/DOSE, 2 MG/1.5ML SOPN Inject 0.5 mg into the skin every Monday.   . SUMAtriptan (IMITREX) 100 MG tablet Take 100 mg by mouth every 2 (two) hours as needed for migraine or headache. May repeat in 2 hours if headache persists or recurs.  . traMADol (ULTRAM) 50 MG tablet Take 50 mg by mouth every 6 (six) hours as needed for moderate pain.   . vitamin B-12 (CYANOCOBALAMIN) 1000 MCG tablet Take 1,000 mcg by mouth daily.  Marland Kitchen oxyCODONE (OXY IR/ROXICODONE) 5 MG  immediate release tablet Take 1 tablet (5 mg total) by mouth every 4 (four) hours as needed for severe pain. For AFTER surgery only, do not take and drive  . senna-docusate (SENOKOT-S) 8.6-50 MG tablet Take 2 tablets by mouth at bedtime. For AFTER surgery, do not take if having diarrhea  . [DISCONTINUED] amLODipine (NORVASC) 5 MG tablet Take by mouth.  . [DISCONTINUED] cholecalciferol (VITAMIN D3) 25 MCG (1000 UT) tablet Take 1,000 Units by mouth daily.  . [DISCONTINUED] Cholecalciferol 25 MCG (1000 UT) tablet Take by mouth.  . [DISCONTINUED] cyanocobalamin 1000 MCG tablet Take by mouth.  . [DISCONTINUED] ferrous sulfate 325 (65 FE) MG tablet Take 1 tablet (325 mg total) by mouth 3 (three) times daily after meals. (Patient not taking: Reported on 04/09/2019)  . [DISCONTINUED] Multiple Vitamins-Iron (ONE DAILY MULTIVITAMIN/IRON PO) Take 1 tablet by mouth daily.  . [DISCONTINUED] naproxen (NAPROSYN) 500 MG tablet Take by mouth.  . [DISCONTINUED] Semaglutide,0.25 or 0.5MG /DOS, (OZEMPIC, 0.25 OR 0.5 MG/DOSE,) 2 MG/1.5ML SOPN Inject into the skin.  . [DISCONTINUED] triamcinolone cream (KENALOG) 0.1 % APPLY TO THE AFFECTED AREA TWICE TO THREE TIMES DAILY FOR 5 TO 7 DAYS   No facility-administered encounter medications on file as of 04/09/2019.    ALLERGIES:  Allergies  Allergen Reactions  . Topiramate     Other reaction(s): double vision  . Chlorhexidine Gluconate Rash  . Tape Rash    DERMABOND     FAMILY HISTORY:  Family History  Problem Relation Age of Onset  . Cancer Maternal Grandmother        Either uterine or endometrial  . Colon cancer Neg Hx   . Breast cancer Neg Hx      SOCIAL HISTORY:    Social Connections:   . Frequency of Communication with Friends and Family:   . Frequency of Social Gatherings with Friends and Family:   . Attends Religious Services:   . Active Member of Clubs or Organizations:   . Attends Banker Meetings:   Marland Kitchen Marital Status:     REVIEW  OF SYSTEMS:  Pertinent positives as per HPI Denies appetite changes, fevers, chills, fatigue, unexplained weight changes. Denies hearing loss, neck lumps or masses, mouth sores, ringing in ears or voice changes. Denies cough or wheezing.  Denies shortness of breath. Denies chest pain or palpitations. Denies leg swelling. Denies abdominal distention, pain, blood in stools, constipation, diarrhea, nausea, vomiting, or early satiety. Denies pain with intercourse, dysuria, frequency, hematuria or incontinence. Denies hot flashes, pelvic pain, vaginal bleeding or vaginal discharge.   Denies joint pain, back pain or muscle pain/cramps. Denies itching, rash, or wounds. Denies dizziness, headaches, numbness or seizures. Denies swollen lymph nodes or glands, denies easy bruising or bleeding. Denies anxiety, depression, confusion, or decreased concentration.  Physical Exam:  Vital  Signs for this encounter:  Blood pressure 113/78, pulse 89, temperature 98.5 F (36.9 C), temperature source Temporal, height 5\' 1"  (1.549 m), weight 234 lb 6 oz (106.3 kg), SpO2 100 %. Body mass index is 44.28 kg/m. General: Alert, oriented, no acute distress.  HEENT: Normocephalic, atraumatic. Sclera anicteric.  Chest: Clear to auscultation bilaterally. No wheezes, rhonchi, or rales. Cardiovascular: Regular rate and rhythm, no murmurs, rubs, or gallops.  Abdomen: Obese. Normoactive bowel sounds. Soft, nondistended, nontender to palpation. No masses or hepatosplenomegaly appreciated. No palpable fluid wave. Well-healed laparoscopic incisions as well as a Pfannenstiel incision. Extremities: Grossly normal range of motion. Warm, well perfused. No edema bilaterally.  Skin: No rashes or lesions.  Lymphatics: No cervical, supraclavicular, or inguinal adenopathy.  GU:  Normal external female genitalia.  No lesions. No discharge or bleeding.             Bladder/urethra:  No lesions or masses, well supported bladder              Vagina: Mildly atrophic, cuff intact. No lesions or masses.             Cervix: Surgically absent.             Uterus: Surgically absent.             Adnexa: Some fullness noted in the cul-de-sac, midline, size evaluation limited by body habitus.  Rectal: Confirmed bimanual exam, no nodularity appreciated.  LABORATORY AND RADIOLOGIC DATA:  Outside medical records were reviewed to synthesize the above history, along with the history and physical obtained during the visit.   Lab Results  Component Value Date   WBC 8.7 01/06/2019   HGB 14.2 01/06/2019   HCT 43.3 01/06/2019   PLT 277 01/06/2019   GLUCOSE 105 (H) 01/06/2019   CHOL 258 (A) 09/09/2017   TRIG 118 09/09/2017   HDL 106 (A) 09/09/2017   LDLCALC 128 09/09/2017   ALT 15 04/13/2014   AST 18 04/13/2014   NA 138 01/06/2019   K 3.7 01/06/2019   CL 98 01/06/2019   CREATININE 0.93 01/06/2019   BUN 11 01/06/2019   CO2 27 01/06/2019   INR 1.00 04/13/2014   HGBA1C 6.3 (H) 01/06/2019   Globin A1c, point-of-care on 3/4: 5.6%  Pelvic ultrasound 04/01/19: 1. 7.9 x 5.0 x 8.4 cm complex septated left adnexal cystic mass. Finding is indeterminate, but could potentially reflect a cystic ovarian neoplasm. Gynecologic referral for surgical consultation and further workup recommended. 2. Prior hysterectomy and right oophorectomy. No other pelvic or adnexal mass identified.  CT A/P on 11/23/19: 1. 3.5 cm length of small bowel is contained in a ventral supraumbilical hernia, this defect measures approximately 1.5 cm in diameter and is approximately 7 cm above the level of the umbilicus. There are some mildly dilated bowel loops with small air-fluid levels near the site of hernia however there are no the focal inflammatory changes or bowel wall thickening to suggest incarcerated hernia or mechanical obstruction at this time. Short interval surgical consultation-follow-up is recommended. 2. 5 cm low-density cystic lesion in the left adnexa.  Follow-up ultrasound recommended to further assess. 3. 1.5 cm left renal cyst. 2 mm left renal lower pole parenchymal stone. No calcified ureteral stone or obstruction.  CA-125 on 04/02/2019: 15

## 2019-04-05 NOTE — H&P (View-Only) (Signed)
GYNECOLOGIC ONCOLOGY NEW PATIENT CONSULTATION   Patient Name: Kelsey Simmons  Patient Age: 59 y.o. Date of Service: 04/09/19 Referring Provider: Doristine Bosworth, MD [address] [place],  [place] [address]   Primary Care Provider: Doristine Bosworth, MD Consulting Provider: Eugene Garnet, MD   Assessment/Plan:  Postmenopausal patient with an enlarging complex adnexal mass.  Discussed possible etiologies of this mass including benign and malignant process. Overall, the patient is asymptomatic with a normal Ca1 25 and CT scan in December with no evidence of metastatic disease. My suspicion is that this mass is of benign etiology; however, given her menopausal status and its enlarging size, I recommend diagnostic and therapeutic treatment in the form of surgical excision.  The patient has an extensive surgical history including most recently repair of a ventral hernia with an 11 cm permanent mesh. We discussed the increased risk associated with surgery due to adhesions. I think it is feasible to attempt surgery robotically. If this is not possible, then I will use the patient's prior Pfannenstiel incision. We discussed plan for exploratory surgery with removal of the abnormal adnexa. If this is done robotically, we will place the ovary in a bag for contain drainage and removal. We will plan to send pathology while the patient is asleep for frozen section. If malignancy encountered, then I would proceed with staging to include lymphadenectomy, omentectomy, peritoneal biopsies and any other tumor debulking.  We discussed plan for a robotic assisted unilateral salpingo-oophorectomy, vision of any remnants of the contralateral tube and ovary, possible laparotomy, possible staging to include lymphadenectomy, omentectomy and tumor debulking. The risks of surgery were discussed in detail and she understands these to include infection; wound separation; hernia; vaginal cuff separation, injury to adjacent organs  such as bowel, bladder, blood vessels, ureters and nerves; bleeding which may require blood transfusion; anesthesia risk; thromboembolic events; possible death; unforeseen complications; possible need for re-exploration; medical complications such as heart attack, stroke, pleural effusion and pneumonia; and, if full lymphadenectomy is performed the risk of lymphedema and lymphocyst. The patient will receive DVT and antibiotic prophylaxis as indicated. She voiced a clear understanding. She had the opportunity to ask questions. Perioperative instructions were reviewed with her. Prescriptions for post-op medications were sent to her pharmacy of choice.  The patient is currently scheduled to get her Covid vaccine on Sunday. I recommend that she delay this for a week just in case she has any side effects so that we are not canceling surgery on the 29th.  A copy of this note was sent to the patient's referring provider.   45 minutes of total time was spent for this patient encounter, including preparation, face-to-face counseling with the patient and coordination of care, and documentation of the encounter.  Eugene Garnet, MD  Division of Gynecologic Oncology  Department of Obstetrics and Gynecology  [facility]  ___________________________________________  Chief Complaint: Chief Complaint  Patient presents with  . Cyst of left ovary    New Patient    History of Present Illness:  Kelsey Simmons is a 59 y.o. y.o. female who is seen in consultation at the request of Claude Manges FNP for an evaluation of an adnexal mass.  The patient reports that her pelvic mass was found at the time of CT scan for her ventral hernia. The plan with her primary care provider had been for follow-up imaging in several months. Given upcoming change in insurance, she recently had a repeat pelvic ultrasound which showed  increasing size of this adnexal mass. The patient denies any abdominal or  pelvic pain. She reports having a good appetite without any nausea or emesis. 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 She has lost approximately 16 pounds since surgery in December and is on Ozempic. She notes having daily bowel movements although has to be careful about her water intake so that she does not become constipated.  Last had a virtual visit with her primary care provider in early March  Her surgical history is notable for C-section, what sounds like a diagnostic laparoscopic surgery sometime after hysterectomy, total hysterectomy and USO, and most recently a laparoscopic ventral hernia repair for an incarcerated supraumbilical hernia on 01/11/19. Repaired with an 11 cm circular permanent mesh.   The patient uses a walker when she is going to be out and about and unsure whether she will have a place to sit. She cannot stand for long periods of time. She denies any chest pain or shortness of breath at rest. She has some shortness of breath with stairs or longer periods of ambulation.  PAST MEDICAL HISTORY:  Past Medical History:  Diagnosis Date  . Arthritis   . Depression 2020   in therapy but not on medications  . Diabetes mellitus without complication (HCC)   . Fibromyalgia   . Headache(784.0)    HX MIGRAINES  . History of kidney stones 2020   left parenchymal stone identified on CT 11/23/18  . Hypertension   . Paresthesia of lower extremity   . Sleep apnea    HAS NOT USED C PAP SINCE 2007 DUE TO WT LOSS      PAST SURGICAL HISTORY:  Past Surgical History:  Procedure Laterality Date  . ABDOMINAL HYSTERECTOMY  1997  . BUNIONECTOMY  2012  . CESAREAN SECTION    . GASTRIC BYPASS  2006  . JOINT REPLACEMENT     RTKA 7'15  . 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 TOTAL KNEE ARTHROPLASTY Left 04/13/2014    Procedure: LEFT TOTAL KNEE ARTHROPLASTY;  Surgeon: Ronald Gioffre, MD;  Location: [facility];  Service: Orthopedics;  Laterality: Left;  . VENTRAL HERNIA REPAIR N/A 01/11/2019   Procedure: LAPAROSCOPIC VENTRAL HERNIA REPAIR WITH MESH;  Surgeon: Lovick, Ayesha N, MD;  Location: [facility];  Service: General;  Laterality: N/A;    OB/GYN HISTORY:  OB History  No obstetric history on file.    No LMP recorded. Patient has had a hysterectomy.  Age at menarche: 14 Age at menopause: Hysterectomy at 35 (patient unsure why), denies any bleeding since. This was performed in 1997 in [place]. She had hot flashes starting about 10 years ago. Hx of HRT: Denies Hx of STDs: No Last pap: 02/2018, negative; HPV negative History of abnormal pap smears: No  SCREENING STUDIES:  Last mammogram: 02/2019  Last bone mineral density: 2016  MEDICATIONS: Outpatient Encounter Medications as of 04/09/2019  Medication Sig  . amLODipine (NORVASC) 5 MG tablet Take 5 mg by mouth daily.   . butalbital-aspirin-caffeine (FIORINAL) 50-325-40 MG capsule take 1 capsule by mouth every 4 hours if needed  . docusate sodium (COLACE) 100 MG capsule Take 1 capsule (100 mg total) by mouth 2 (two) times daily.  . gabapentin (NEURONTIN) 300 MG capsule Take 2 capsules (600 mg total) by mouth 3 (three) times daily.  .   hydrochlorothiazide (HYDRODIURIL) 25 MG tablet Take 25 mg by mouth every morning.  . meloxicam (MOBIC) 15 MG tablet Take 15 mg by mouth daily.  Marland Kitchen OZEMPIC, 0.25 OR 0.5 MG/DOSE, 2 MG/1.5ML SOPN Inject 0.5 mg into the skin every Monday.   . SUMAtriptan (IMITREX) 100 MG tablet Take 100 mg by mouth every 2 (two) hours as needed for migraine or headache. May repeat in 2 hours if headache persists or recurs.  . traMADol (ULTRAM) 50 MG tablet Take 50 mg by mouth every 6 (six) hours as needed for moderate pain.   . vitamin B-12 (CYANOCOBALAMIN) 1000 MCG tablet Take 1,000 mcg by mouth daily.  Marland Kitchen oxyCODONE (OXY IR/ROXICODONE) 5 MG  immediate release tablet Take 1 tablet (5 mg total) by mouth every 4 (four) hours as needed for severe pain. For AFTER surgery only, do not take and drive  . senna-docusate (SENOKOT-S) 8.6-50 MG tablet Take 2 tablets by mouth at bedtime. For AFTER surgery, do not take if having diarrhea  . [DISCONTINUED] amLODipine (NORVASC) 5 MG tablet Take by mouth.  . [DISCONTINUED] cholecalciferol (VITAMIN D3) 25 MCG (1000 UT) tablet Take 1,000 Units by mouth daily.  . [DISCONTINUED] Cholecalciferol 25 MCG (1000 UT) tablet Take by mouth.  . [DISCONTINUED] cyanocobalamin 1000 MCG tablet Take by mouth.  . [DISCONTINUED] ferrous sulfate 325 (65 FE) MG tablet Take 1 tablet (325 mg total) by mouth 3 (three) times daily after meals. (Patient not taking: Reported on 04/09/2019)  . [DISCONTINUED] Multiple Vitamins-Iron (ONE DAILY MULTIVITAMIN/IRON PO) Take 1 tablet by mouth daily.  . [DISCONTINUED] naproxen (NAPROSYN) 500 MG tablet Take by mouth.  . [DISCONTINUED] Semaglutide,0.25 or 0.5MG /DOS, (OZEMPIC, 0.25 OR 0.5 MG/DOSE,) 2 MG/1.5ML SOPN Inject into the skin.  . [DISCONTINUED] triamcinolone cream (KENALOG) 0.1 % APPLY TO THE AFFECTED AREA TWICE TO THREE TIMES DAILY FOR 5 TO 7 DAYS   No facility-administered encounter medications on file as of 04/09/2019.    ALLERGIES:  Allergies  Allergen Reactions  . Topiramate     Other reaction(s): double vision  . Chlorhexidine Gluconate Rash  . Tape Rash    DERMABOND     FAMILY HISTORY:  Family History  Problem Relation Age of Onset  . Cancer Maternal Grandmother        Either uterine or endometrial  . Colon cancer Neg Hx   . Breast cancer Neg Hx      SOCIAL HISTORY:    Social Connections:   . Frequency of Communication with Friends and Family:   . Frequency of Social Gatherings with Friends and Family:   . Attends Religious Services:   . Active Member of Clubs or Organizations:   . Attends Banker Meetings:   Marland Kitchen Marital Status:     REVIEW  OF SYSTEMS:  Pertinent positives as per HPI Denies appetite changes, fevers, chills, fatigue, unexplained weight changes. Denies hearing loss, neck lumps or masses, mouth sores, ringing in ears or voice changes. Denies cough or wheezing.  Denies shortness of breath. Denies chest pain or palpitations. Denies leg swelling. Denies abdominal distention, pain, blood in stools, constipation, diarrhea, nausea, vomiting, or early satiety. Denies pain with intercourse, dysuria, frequency, hematuria or incontinence. Denies hot flashes, pelvic pain, vaginal bleeding or vaginal discharge.   Denies joint pain, back pain or muscle pain/cramps. Denies itching, rash, or wounds. Denies dizziness, headaches, numbness or seizures. Denies swollen lymph nodes or glands, denies easy bruising or bleeding. Denies anxiety, depression, confusion, or decreased concentration.  Physical Exam:  Vital  Signs for this encounter:  Blood pressure 113/78, pulse 89, temperature 98.5 F (36.9 C), temperature source Temporal, height 5\' 1"  (1.549 m), weight 234 lb 6 oz (106.3 kg), SpO2 100 %. Body mass index is 44.28 kg/m. General: Alert, oriented, no acute distress.  HEENT: Normocephalic, atraumatic. Sclera anicteric.  Chest: Clear to auscultation bilaterally. No wheezes, rhonchi, or rales. Cardiovascular: Regular rate and rhythm, no murmurs, rubs, or gallops.  Abdomen: Obese. Normoactive bowel sounds. Soft, nondistended, nontender to palpation. No masses or hepatosplenomegaly appreciated. No palpable fluid wave. Well-healed laparoscopic incisions as well as a Pfannenstiel incision. Extremities: Grossly normal range of motion. Warm, well perfused. No edema bilaterally.  Skin: No rashes or lesions.  Lymphatics: No cervical, supraclavicular, or inguinal adenopathy.  GU:  Normal external female genitalia.  No lesions. No discharge or bleeding.             Bladder/urethra:  No lesions or masses, well supported bladder              Vagina: Mildly atrophic, cuff intact. No lesions or masses.             Cervix: Surgically absent.             Uterus: Surgically absent.             Adnexa: Some fullness noted in the cul-de-sac, midline, size evaluation limited by body habitus.  Rectal: Confirmed bimanual exam, no nodularity appreciated.  LABORATORY AND RADIOLOGIC DATA:  Outside medical records were reviewed to synthesize the above history, along with the history and physical obtained during the visit.   Lab Results  Component Value Date   WBC 8.7 01/06/2019   HGB 14.2 01/06/2019   HCT 43.3 01/06/2019   PLT 277 01/06/2019   GLUCOSE 105 (H) 01/06/2019   CHOL 258 (A) 09/09/2017   TRIG 118 09/09/2017   HDL 106 (A) 09/09/2017   LDLCALC 128 09/09/2017   ALT 15 04/13/2014   AST 18 04/13/2014   NA 138 01/06/2019   K 3.7 01/06/2019   CL 98 01/06/2019   CREATININE 0.93 01/06/2019   BUN 11 01/06/2019   CO2 27 01/06/2019   INR 1.00 04/13/2014   HGBA1C 6.3 (H) 01/06/2019   Globin A1c, point-of-care on 3/4: 5.6%  Pelvic ultrasound 04/01/19: 1. 7.9 x 5.0 x 8.4 cm complex septated left adnexal cystic mass. Finding is indeterminate, but could potentially reflect a cystic ovarian neoplasm. Gynecologic referral for surgical consultation and further workup recommended. 2. Prior hysterectomy and right oophorectomy. No other pelvic or adnexal mass identified.  CT A/P on 11/23/19: 1. 3.5 cm length of small bowel is contained in a ventral supraumbilical hernia, this defect measures approximately 1.5 cm in diameter and is approximately 7 cm above the level of the umbilicus. There are some mildly dilated bowel loops with small air-fluid levels near the site of hernia however there are no the focal inflammatory changes or bowel wall thickening to suggest incarcerated hernia or mechanical obstruction at this time. Short interval surgical consultation-follow-up is recommended. 2. 5 cm low-density cystic lesion in the left adnexa.  Follow-up ultrasound recommended to further assess. 3. 1.5 cm left renal cyst. 2 mm left renal lower pole parenchymal stone. No calcified ureteral stone or obstruction.  CA-125 on 04/02/2019: 15

## 2019-04-09 ENCOUNTER — Encounter: Payer: Self-pay | Admitting: Gynecologic Oncology

## 2019-04-09 ENCOUNTER — Other Ambulatory Visit: Payer: Self-pay

## 2019-04-09 ENCOUNTER — Telehealth: Payer: Self-pay | Admitting: *Deleted

## 2019-04-09 ENCOUNTER — Inpatient Hospital Stay: Payer: BC Managed Care – PPO | Attending: Gynecologic Oncology | Admitting: Gynecologic Oncology

## 2019-04-09 VITALS — BP 113/78 | HR 89 | Temp 98.5°F | Ht 61.0 in | Wt 234.4 lb

## 2019-04-09 DIAGNOSIS — Z9884 Bariatric surgery status: Secondary | ICD-10-CM | POA: Diagnosis not present

## 2019-04-09 DIAGNOSIS — Z96653 Presence of artificial knee joint, bilateral: Secondary | ICD-10-CM | POA: Insufficient documentation

## 2019-04-09 DIAGNOSIS — Z79899 Other long term (current) drug therapy: Secondary | ICD-10-CM | POA: Diagnosis not present

## 2019-04-09 DIAGNOSIS — E119 Type 2 diabetes mellitus without complications: Secondary | ICD-10-CM | POA: Insufficient documentation

## 2019-04-09 DIAGNOSIS — Z791 Long term (current) use of non-steroidal anti-inflammatories (NSAID): Secondary | ICD-10-CM | POA: Insufficient documentation

## 2019-04-09 DIAGNOSIS — Z8049 Family history of malignant neoplasm of other genital organs: Secondary | ICD-10-CM | POA: Insufficient documentation

## 2019-04-09 DIAGNOSIS — G4730 Sleep apnea, unspecified: Secondary | ICD-10-CM | POA: Insufficient documentation

## 2019-04-09 DIAGNOSIS — Z90710 Acquired absence of both cervix and uterus: Secondary | ICD-10-CM | POA: Diagnosis not present

## 2019-04-09 DIAGNOSIS — N83202 Unspecified ovarian cyst, left side: Secondary | ICD-10-CM

## 2019-04-09 DIAGNOSIS — I10 Essential (primary) hypertension: Secondary | ICD-10-CM | POA: Insufficient documentation

## 2019-04-09 MED ORDER — OXYCODONE HCL 5 MG PO TABS: 5.0000 mg | ORAL_TABLET | ORAL | 0 refills | Status: DC | PRN

## 2019-04-09 MED ORDER — SENNOSIDES-DOCUSATE SODIUM 8.6-50 MG PO TABS: 2.0000 | ORAL_TABLET | Freq: Every day | ORAL | 0 refills | Status: DC

## 2019-04-09 NOTE — Patient Instructions (Signed)
Preparing for your Surgery  Plan for surgery on April 19, 2019 with Dr. Jeral Pinch at [place] will be scheduled for a robotic unilateral salpingo-oophorectomy, possible laparotomy, possible staging if cancer identified.   Pre-operative Testing -You will receive a phone call from presurgical testing at [facility] to arrange for a pre-operative phone visit with a nurse, lab appointment, and COVID test prior to surgery. You will be asked to self quarantine yourself after your COVID test.  -Bring your insurance card, copy of an advanced directive if applicable, medication list  -At that visit, you will be asked to sign a consent for a possible blood transfusion in case a transfusion becomes necessary during surgery.  The need for a blood transfusion is rare but having consent is a necessary part of your care.     -You should not be taking blood thinners or aspirin at least ten days prior to surgery unless instructed by your surgeon. Please stop your mobic (meloxicam) five days before surgery.  -Do not take supplements such as fish oil (omega 3), red yeast Loss, tumeric before your surgery.   Day Before Surgery at [place] will be asked to take in a light diet the day before surgery.  Avoid carbonated beverages.  You will be advised to have nothing to eat or drink after midnight the evening before.    Eat a light diet the day before surgery.  Examples including soups, broths, toast, yogurt, mashed potatoes.  Things to avoid include carbonated beverages (fizzy beverages), raw fruits and raw vegetables, or beans.   If your bowels are filled with gas, your surgeon will have difficulty visualizing your pelvic organs which increases your surgical risks.  Your role in recovery Your role is to become active as soon as directed by your doctor, while still giving yourself time to heal.  Rest when you feel tired. You will be asked to do the following in order to speed your  recovery:  - Cough and breathe deeply. This helps to clear and expand your lungs and can prevent pneumonia after surgery.  - [place]. Do mild physical activity. Walking or moving your legs help your circulation and body functions return to normal. Do not try to get up or walk alone the first time after surgery.   -If you develop swelling on one leg or the other, pain in the back of your leg, redness/warmth in one of your legs, please call the office or go to the Emergency Room to have a doppler to rule out a blood clot. For shortness of breath, chest pain-seek care in the Emergency Room as soon as possible. - Actively manage your pain. Managing your pain lets you move in comfort. We will ask you to rate your pain on a scale of zero to 10. It is your responsibility to tell your doctor or nurse where and how much you hurt so your pain can be treated.  Special Considerations -If you are diabetic, you may be placed on insulin after surgery to have closer control over your blood sugars to promote healing and recovery.  This does not mean that you will be discharged on insulin.  If applicable, your oral antidiabetics will be resumed when you are tolerating a solid diet.  -Your final pathology results from surgery should be available around one week after surgery and the results will be relayed to you when available.  -FMLA forms can be faxed to [phone] and  please allow 5-7 business days for completion.  Pain Management After Surgery -You have been prescribed your pain medication and bowel regimen medications before surgery so that you can have these available when you are discharged from the hospital. The pain medication is for use ONLY AFTER surgery and a new prescription will not be given.   -Make sure that you have Tylenol at home to use on a regular basis after surgery for pain control.   -Review the attached handout on narcotic use and their risks and side effects.    Bowel Regimen -You have been prescribed Sennakot-S to take nightly to prevent constipation especially if you are taking the narcotic pain medication intermittently.  It is important to prevent constipation and drink adequate amounts of liquids. You can stop taking this medication when you are not taking pain medication and you are back on your normal bowel routine.   Blood Transfusion Information (For the consent to be signed before surgery)  We will be checking your blood type before surgery so in case of emergencies, we will know what type of blood you would need.                                            WHAT IS A BLOOD TRANSFUSION?  A transfusion is the replacement of blood or some of its parts. Blood is made up of multiple cells which provide different functions.  Red blood cells carry oxygen and are used for blood loss replacement.  White blood cells fight against infection.  Platelets control bleeding.  Plasma helps clot blood.  Other blood products are available for specialized needs, such as hemophilia or other clotting disorders. BEFORE THE TRANSFUSION  Who gives blood for transfusions?   You may be able to donate blood to be used at a later date on yourself (autologous donation).  Relatives can be asked to donate blood. This is generally not any safer than if you have received blood from a stranger. The same precautions are taken to ensure safety when a relative's blood is donated.  Healthy volunteers who are fully evaluated to make sure their blood is safe. This is blood bank blood. Transfusion therapy is the safest it has ever been in the practice of medicine. Before blood is taken from a donor, a complete history is taken to make sure that person has no history of diseases nor engages in risky social behavior (examples are intravenous drug use or sexual activity with multiple partners). The donor's travel history is screened to minimize risk of transmitting infections,  such as malaria. The donated blood is tested for signs of infectious diseases, such as HIV and hepatitis. The blood is then tested to be sure it is compatible with you in order to minimize the chance of a transfusion reaction. If you or a relative donates blood, this is often done in anticipation of surgery and is not appropriate for emergency situations. It takes many days to process the donated blood. RISKS AND COMPLICATIONS Although transfusion therapy is very safe and saves many lives, the main dangers of transfusion include:   Getting an infectious disease.  Developing a transfusion reaction. This is an allergic reaction to something in the blood you were given. Every precaution is taken to prevent this. The decision to have a blood transfusion has been considered carefully by your caregiver before blood is given. Blood  is not given unless the benefits outweigh the risks.  AFTER SURGERY INSTRUCTIONS  Return to work: 4-6 weeks if applicable  Activity: 1. Be up and out of the bed during the day.  Take a nap if needed.  You may walk up steps but be careful and use the hand rail.  Stair climbing will tire you more than you think, you may need to stop part way and rest.   2. No lifting or straining for 6 weeks over 10 pounds. No pushing, pulling, straining for 6 weeks.  3. No driving for 1 week minimum.  Do not drive if you are taking narcotic pain medicine.   4. You can shower as soon as the next day after surgery. Shower daily.  Use soap and water on your incision and pat dry; don't rub.  No tub baths or submerging your body in water until cleared by your surgeon. If you have the soap that was given to you by pre-surgical testing that was used before surgery, you do not need to use it afterwards because this can irritate your incisions.   5. No sexual activity and nothing in the vagina for 2 weeks.  6. You may experience a small amount of clear drainage from your incisions, which is normal.   If the drainage persists, increases, or changes color please call the office.  7. Do not use creams, lotions, or ointments such as neosporin on your incisions after surgery until advised by your surgeon because they can cause removal of the dermabond glue on your incisions.    9. Take Tylenol first for pain and only use narcotic pain medication for severe pain not relieved by the Tylenol.  Monitor your Tylenol intake to a max of 4,000 mg.  Diet: 1. Low sodium Heart Healthy Diet is recommended.  2. It is safe to use a laxative, such as Miralax or Colace, if you have difficulty moving your bowels. You can take Sennakot at bedtime every evening to keep bowel movements regular and to prevent constipation.    Wound Care: 1. Keep clean and dry.  Shower daily.  Reasons to call the Doctor:  Fever - Oral temperature greater than 100.4 degrees Fahrenheit  Foul-smelling vaginal discharge  Difficulty urinating  Nausea and vomiting  Increased pain at the site of the incision that is unrelieved with pain medicine.  Difficulty breathing with or without chest pain  New calf pain especially if only on one side  Contacts: For questions or concerns you should contact:  Dr. Eugene Garnet at [phone]  Warner Mccreedy, NP at [phone]  After Hours: call [phone] and have the GYN Oncologist paged/contacted

## 2019-04-09 NOTE — Telephone Encounter (Signed)
Per Dr Pricilla Holm fax today's office note to the referring office

## 2019-04-11 ENCOUNTER — Other Ambulatory Visit: Payer: Self-pay | Admitting: Neurology

## 2019-04-12 ENCOUNTER — Other Ambulatory Visit: Payer: Self-pay | Admitting: Gynecologic Oncology

## 2019-04-12 DIAGNOSIS — N83202 Unspecified ovarian cyst, left side: Secondary | ICD-10-CM

## 2019-04-13 NOTE — Progress Notes (Signed)
DUE TO COVID-19 ONLY ONE VISITOR IS ALLOWED TO COME WITH YOU AND STAY IN THE WAITING ROOM ONLY DURING PRE OP AND PROCEDURE DAY OF SURGERY. THE 1 VISITOR MAY VISIT WITH YOU AFTER SURGERY IN YOUR PRIVATE ROOM DURING VISITING HOURS ONLY!  YOU NEED TO HAVE A COVID 19 TEST ON_______ @_______ , THIS TEST MUST BE DONE BEFORE SURGERY, COME  [address], [place] , .  [facility]) ONCE YOUR COVID TEST IS COMPLETED, PLEASE BEGIN THE QUARANTINE INSTRUCTIONS AS OUTLINED IN YOUR HANDOUT.                Kelsey Simmons  04/13/2019   Your procedure is scheduled on:  04/19/2019   Report to [facility] Main  Entrance   Report to admitting at    0600 AM     Call this number if you have problems the morning of surgery [phone]    Remember: Do not eat food or drink liquids :After Midnight. BRUSH YOUR TEETH MORNING OF SURGERY AND RINSE YOUR MOUTH OUT, NO CHEWING GUM CANDY OR MINTS. Eat a light diet the day before surgery.  Avoid gas producing foods.  May have clear liquids after midnite the nite before surgery until 3 hours prior to surgery which is 0500am.     Take these medicines the morning of surgery with A SIP OF WATER:  Amlodipine, Gabapentin  DO NOT TAKE ANY DIABETIC MEDICATIONS DAY OF YOUR SURGERY                               You may not have any metal on your body including hair pins and              piercings  Do not wear jewelry, make-up, lotions, powders or perfumes, deodorant             Do not wear nail polish on your fingernails.  Do not shave  48 hours prior to surgery.     Do not bring valuables to the hospital. [facility] IS NOT             RESPONSIBLE   FOR VALUABLES.  Contacts, dentures or bridgework may not be worn into surgery.  Leave suitcase in the car. After surgery it may be brought to your room.     Patients discharged the day of surgery will not be allowed to drive home. IF YOU ARE HAVING SURGERY AND GOING HOME THE SAME DAY, YOU  MUST HAVE AN ADULT TO DRIVE YOU HOME AND BE WITH YOU FOR 24 HOURS. YOU MAY GO HOME BY TAXI OR UBER OR ORTHERWISE, BUT AN ADULT MUST ACCOMPANY YOU HOME AND STAY WITH YOU FOR 24 HOURS.  Name and phone number of your driver:                Please read over the following fact sheets you were given: _____________________________________________________________________                CLEAR LIQUID DIET   Foods Allowed  Foods Excluded  Coffee and tea, regular and decaf                             liquids that you cannot  Plain Jell-O any favor except red or purple                                           see through such as: Fruit ices (not with fruit pulp)                                     milk, soups, orange juice  Iced Popsicles                                    All solid food Carbonated beverages, regular and diet                                    Cranberry, grape and apple juices Sports drinks like Gatorade Lightly seasoned clear broth or consume(fat free) Sugar, honey syrup  Sample Menu Breakfast                                Lunch                                     Supper Cranberry juice                    Beef broth                            Chicken broth Jell-O                                     Grape juice                           Apple juice Coffee or tea                        Jell-O                                      Popsicle                                                Coffee or tea                        Coffee or tea  _____________________________________________________________________       FAILURE TO FOLLOW THESE INSTRUCTIONS MAY RESULT IN THE CANCELLATION OF YOUR SURGERY PATIENT SIGNATURE_________________________________  NURSE  SIGNATURE__________________________________  ________________________________________________________________________   Kelsey Simmons  An  incentive spirometer is a tool that can help keep your lungs clear and active. This tool measures how well you are filling your lungs with each breath. Taking long deep breaths may help reverse or decrease the chance of developing breathing (pulmonary) problems (especially infection) following:  A long period of time when you are unable to move or be active. BEFORE THE PROCEDURE   If the spirometer includes an indicator to show your best effort, your nurse or respiratory therapist will set it to a desired goal.  If possible, sit up straight or lean slightly forward. Try not to slouch.  Hold the incentive spirometer in an upright position. INSTRUCTIONS FOR USE  1. Sit on the edge of your bed if possible, or sit up as far as you can in bed or on a chair. 2. Hold the incentive spirometer in an upright position. 3. Breathe out normally. 4. Place the mouthpiece in your mouth and seal your lips tightly around it. 5. Breathe in slowly and as deeply as possible, raising the piston or the ball toward the top of the column. 6. Hold your breath for 3-5 seconds or for as long as possible. Allow the piston or ball to fall to the bottom of the column. 7. Remove the mouthpiece from your mouth and breathe out normally. 8. Rest for a few seconds and repeat Steps 1 through 7 at least 10 times every 1-2 hours when you are awake. Take your time and take a few normal breaths between deep breaths. 9. The spirometer may include an indicator to show your best effort. Use the indicator as a goal to work toward during each repetition. 10. After each set of 10 deep breaths, practice coughing to be sure your lungs are clear. If you have an incision (the cut made at the time of surgery), support your incision when coughing by placing a pillow or rolled up towels firmly against it. Once you are able to get out of bed, walk around indoors and cough well. You may stop using the incentive spirometer when instructed by your  caregiver.  RISKS AND COMPLICATIONS  Take your time so you do not get dizzy or light-headed.  If you are in pain, you may need to take or ask for pain medication before doing incentive spirometry. It is harder to take a deep breath if you are having pain. AFTER USE  Rest and breathe slowly and easily.  It can be helpful to keep track of a log of your progress. Your caregiver can provide you with a simple table to help with this. If you are using the spirometer at home, follow these instructions: SEEK MEDICAL CARE IF:   You are having difficultly using the spirometer.  You have trouble using the spirometer as often as instructed.  Your pain medication is not giving enough relief while using the spirometer.  You develop fever of 100.5 F (38.1 C) or higher. SEEK IMMEDIATE MEDICAL CARE IF:   You cough up bloody sputum that had not been present before.  You develop fever of 102 F (38.9 C) or greater.  You develop worsening pain at or near the incision site. MAKE SURE YOU:   Understand these instructions.  Will watch your condition.  Will get help right away if you are not doing well or get worse. Document Released: 05/20/2006 Document Revised: 04/01/2011 Document Reviewed: 07/21/2006 [facility] Patient Information 2014 [employer], [place].   ________________________________________________________________________  WHAT  IS A BLOOD TRANSFUSION? Blood Transfusion Information  A transfusion is the replacement of blood or some of its parts. Blood is made up of multiple cells which provide different functions.  Red blood cells carry oxygen and are used for blood loss replacement.  White blood cells fight against infection.  Platelets control bleeding.  Plasma helps clot blood.  Other blood products are available for specialized needs, such as hemophilia or other clotting disorders. BEFORE THE TRANSFUSION  Who gives blood for transfusions?   Healthy volunteers who are fully  evaluated to make sure their blood is safe. This is blood bank blood. Transfusion therapy is the safest it has ever been in the practice of medicine. Before blood is taken from a donor, a complete history is taken to make sure that person has no history of diseases nor engages in risky social behavior (examples are intravenous drug use or sexual activity with multiple partners). The donor's travel history is screened to minimize risk of transmitting infections, such as malaria. The donated blood is tested for signs of infectious diseases, such as HIV and hepatitis. The blood is then tested to be sure it is compatible with you in order to minimize the chance of a transfusion reaction. If you or a relative donates blood, this is often done in anticipation of surgery and is not appropriate for emergency situations. It takes many days to process the donated blood. RISKS AND COMPLICATIONS Although transfusion therapy is very safe and saves many lives, the main dangers of transfusion include:   Getting an infectious disease.  Developing a transfusion reaction. This is an allergic reaction to something in the blood you were given. Every precaution is taken to prevent this. The decision to have a blood transfusion has been considered carefully by your caregiver before blood is given. Blood is not given unless the benefits outweigh the risks. AFTER THE TRANSFUSION  Right after receiving a blood transfusion, you will usually feel much better and more energetic. This is especially true if your red blood cells have gotten low (anemic). The transfusion raises the level of the red blood cells which carry oxygen, and this usually causes an energy increase.  The nurse administering the transfusion will monitor you carefully for complications. HOME CARE INSTRUCTIONS  No special instructions are needed after a transfusion. You may find your energy is better. Speak with your caregiver about any limitations on activity  for underlying diseases you may have. SEEK MEDICAL CARE IF:   Your condition is not improving after your transfusion.  You develop redness or irritation at the intravenous (IV) site. SEEK IMMEDIATE MEDICAL CARE IF:  Any of the following symptoms occur over the next 12 hours:  Shaking chills.  You have a temperature by mouth above 102 F (38.9 C), not controlled by medicine.  Chest, back, or muscle pain.  People around you feel you are not acting correctly or are confused.  Shortness of breath or difficulty breathing.  Dizziness and fainting.  You get a rash or develop hives.  You have a decrease in urine output.  Your urine turns a dark color or changes to pink, red, or brown. Any of the following symptoms occur over the next 10 days:  You have a temperature by mouth above 102 F (38.9 C), not controlled by medicine.  Shortness of breath.  Weakness after normal activity.  The white part of the eye turns yellow (jaundice).  You have a decrease in the amount of urine or are urinating  less often.  Your urine turns a dark color or changes to pink, red, or brown. Document Released: 01/05/2000 Document Revised: 04/01/2011 Document Reviewed: 08/24/2007 [facility] Patient Information 2014 [place], [place].  _______________________________________________________________________

## 2019-04-14 ENCOUNTER — Other Ambulatory Visit: Payer: Self-pay

## 2019-04-14 ENCOUNTER — Encounter (HOSPITAL_COMMUNITY): Payer: Self-pay

## 2019-04-14 ENCOUNTER — Encounter (HOSPITAL_COMMUNITY)
Admission: RE | Admit: 2019-04-14 | Discharge: 2019-04-14 | Disposition: A | Discharge status: Home / Self Care | Payer: BC Managed Care – PPO | Source: Ambulatory Visit | Attending: Gynecologic Oncology | Admitting: Gynecologic Oncology

## 2019-04-14 ENCOUNTER — Telehealth: Payer: Self-pay

## 2019-04-14 DIAGNOSIS — Z01812 Encounter for preprocedural laboratory examination: Secondary | ICD-10-CM | POA: Diagnosis not present

## 2019-04-14 HISTORY — DX: Anxiety disorder, unspecified: F41.9

## 2019-04-14 LAB — URINALYSIS, ROUTINE W REFLEX MICROSCOPIC
Bilirubin Urine: NEGATIVE
Glucose, UA: NEGATIVE mg/dL
Ketones, ur: NEGATIVE mg/dL
Leukocytes,Ua: NEGATIVE
Nitrite: NEGATIVE
Protein, ur: NEGATIVE mg/dL
Specific Gravity, Urine: 1.014 (ref 1.005–1.030)
pH: 6 (ref 5.0–8.0)

## 2019-04-14 LAB — CBC
HCT: 41.8 % (ref 36.0–46.0)
Hemoglobin: 13.2 g/dL (ref 12.0–15.0)
MCH: 28.7 pg (ref 26.0–34.0)
MCHC: 31.6 g/dL (ref 30.0–36.0)
MCV: 90.9 fL (ref 80.0–100.0)
Platelets: 258 10*3/uL (ref 150–400)
RBC: 4.6 MIL/uL (ref 3.87–5.11)
RDW: 14.5 % (ref 11.5–15.5)
WBC: 11.6 10*3/uL — ABNORMAL HIGH (ref 4.0–10.5)
nRBC: 0 % (ref 0.0–0.2)

## 2019-04-14 LAB — COMPREHENSIVE METABOLIC PANEL
ALT: 15 U/L (ref 0–44)
AST: 16 U/L (ref 15–41)
Albumin: 4 g/dL (ref 3.5–5.0)
Alkaline Phosphatase: 55 U/L (ref 38–126)
Anion gap: 8 (ref 5–15)
BUN: 19 mg/dL (ref 6–20)
CO2: 32 mmol/L (ref 22–32)
Calcium: 9.5 mg/dL (ref 8.9–10.3)
Chloride: 100 mmol/L (ref 98–111)
Creatinine, Ser: 0.79 mg/dL (ref 0.44–1.00)
GFR calc Af Amer: >60 mL/min (ref >60)
GFR calc non Af Amer: >60 mL/min (ref >60)
Glucose, Bld: 95 mg/dL (ref 70–99)
Potassium: 3.4 mmol/L — ABNORMAL LOW (ref 3.5–5.1)
Sodium: 140 mmol/L (ref 135–145)
Total Bilirubin: 0.3 mg/dL (ref 0.3–1.2)
Total Protein: 7.9 g/dL (ref 6.5–8.1)

## 2019-04-14 LAB — GLUCOSE, CAPILLARY: Glucose-Capillary: 104 mg/dL — ABNORMAL HIGH (ref 70–99)

## 2019-04-14 NOTE — Progress Notes (Signed)
PCP - Claude Manges Cardiologist -   Chest x-ray -  EKG - 12-27-18 epic Stress Test -  ECHO -  Cardiac Cath -  hgba1c     5.4  03-25-19 care everywhere  Sleep Study -  CPAP -   Fasting Blood Sugar - 110 Checks Blood Sugar _3-4 x day____ times a day  Blood Thinner Instructions: Aspirin Instructions: Last Dose:  Anesthesia review:   Patient denies shortness of breath, fever, cough and chest pain at PAT appointment   none   Patient verbalized understanding of instructions that were given to them at the PAT appointment. Patient was also instructed that they will need to review over the PAT instructions again at home before surgery.

## 2019-04-14 NOTE — Telephone Encounter (Signed)
Told Kelsey Simmons that her potassium is slightly low at 3.4. [name],NP suggests eating a banana or having a glass of OJ daily to increase the potassium in her diet prior to surgery. Pt verbalized understanding.

## 2019-04-14 NOTE — Patient Instructions (Addendum)
DUE TO COVID-19 ONLY ONE VISITOR IS ALLOWED TO COME WITH YOU AND STAY IN THE WAITING ROOM ONLY DURING PRE OP AND PROCEDURE DAY OF SURGERY. THE 1 VISITOR MAY VISIT WITH YOU AFTER SURGERY IN YOUR PRIVATE ROOM DURING VISITING HOURS ONLY!  10-am-8pm  YOU NEED TO HAVE A COVID 19 TEST ON___3-25____ @_250  pm______, THIS TEST MUST BE DONE BEFORE SURGERY, COME  [address], [place] , .  [facility]) ONCE YOUR COVID TEST IS COMPLETED, PLEASE BEGIN THE QUARANTINE INSTRUCTIONS AS OUTLINED IN YOUR HANDOUT.                Kelsey Simmons  04/14/2019   Your procedure is scheduled on:                                                                    04/19/2019    Report to [facility] Main  Entrance   Report to admitting at    0600 AM     Call this number if you have problems the morning of surgery [phone]    Remember: Do not eat food  :After Midnight. BRUSH YOUR TEETH MORNING OF SURGERY AND RINSE YOUR MOUTH OUT, NO CHEWING GUM CANDY OR MINTS.   Eat a light diet the day before surgery.  Avoid gas producing foods.   May have clear liquids after midnite the nite before surgery until 3 hours prior to surgery which is 0500am.     Take these medicines the morning of surgery with A SIP OF WATER:  Amlodipine, Gabapentin   DO NOT TAKE ANY DIABETIC MEDICATIONS DAY OF YOUR SURGERY                               You may not have any metal on your body including hair pins and              piercings  Do not wear jewelry, make-up, lotions, powders or perfumes, deodorant             Do not wear nail polish on your fingernails.  Do not shave  48 hours prior to surgery.     Do not bring valuables to the hospital. [facility] IS NOT             RESPONSIBLE   FOR VALUABLES.  Contacts, dentures or bridgework may not be worn into surgery.  Leave suitcase in the car. After surgery it may be brought to your room.     Patients discharged the day of surgery will not be  allowed to drive home. IF YOU ARE HAVING SURGERY AND GOING HOME THE SAME DAY, YOU MUST HAVE AN ADULT TO DRIVE YOU HOME AND BE WITH YOU FOR 24 HOURS. YOU MAY GO HOME BY TAXI OR UBER OR ORTHERWISE, BUT AN ADULT MUST ACCOMPANY YOU HOME AND STAY WITH YOU FOR 24 HOURS.  Name and phone number of your driver:                Please read over the following fact sheets you were given: _____________________________________________________________________           CLEAR LIQUID DIET   Foods Allowed  Foods Excluded  Coffee and tea, regular and decaf                                                 liquids that you cannot  Plain Jell-O any favor except red or purple                                           see through such as: Fruit ices (not with fruit pulp)                                                   milk, soups, orange juice  Iced Popsicles                                                            All solid food                                  Cranberry, grape and apple juices Sports drinks like Gatorade Lightly seasoned clear broth or consume(fat free) Sugar, honey syrup  _____________________________________________________________________       FAILURE TO FOLLOW THESE INSTRUCTIONS MAY RESULT IN THE CANCELLATION OF YOUR SURGERY PATIENT SIGNATURE_________________________________  NURSE SIGNATURE__________________________________  ________________________________________________________________________   Kelsey Simmons  An incentive spirometer is a tool that can help keep your lungs clear and active. This tool measures how well you are filling your lungs with each breath. Taking long deep breaths may help reverse or decrease the chance of developing breathing (pulmonary) problems (especially infection) following:  A long period of time when you are unable to move or be active. BEFORE THE  PROCEDURE   If the spirometer includes an indicator to show your best effort, your nurse or respiratory therapist will set it to a desired goal.  If possible, sit up straight or lean slightly forward. Try not to slouch.  Hold the incentive spirometer in an upright position. INSTRUCTIONS FOR USE  1. Sit on the edge of your bed if possible, or sit up as far as you can in bed or on a chair. 2. Hold the incentive spirometer in an upright position. 3. Breathe out normally. 4. Place the mouthpiece in your mouth and seal your lips tightly around it. 5. Breathe in slowly and as deeply as possible, raising the piston or the ball toward the top of the column. 6. Hold your breath for 3-5 seconds or for as long as possible. Allow the piston or ball to fall to the bottom of the column. 7. Remove the mouthpiece from your mouth and breathe out normally. 8. Rest for a few seconds and repeat Steps 1 through 7 at least 10 times every 1-2 hours when you are awake. Take your time and take a few normal breaths between deep breaths. 9. The spirometer may include an indicator to show your best effort.  Use the indicator as a goal to work toward during each repetition. 10. After each set of 10 deep breaths, practice coughing to be sure your lungs are clear. If you have an incision (the cut made at the time of surgery), support your incision when coughing by placing a pillow or rolled up towels firmly against it. Once you are able to get out of bed, walk around indoors and cough well. You may stop using the incentive spirometer when instructed by your caregiver.  RISKS AND COMPLICATIONS  Take your time so you do not get dizzy or light-headed.  If you are in pain, you may need to take or ask for pain medication before doing incentive spirometry. It is harder to take a deep breath if you are having pain. AFTER USE  Rest and breathe slowly and easily.  It can be helpful to keep track of a log of your progress. Your  caregiver can provide you with a simple table to help with this. If you are using the spirometer at home, follow these instructions: SEEK MEDICAL CARE IF:   You are having difficultly using the spirometer.  You have trouble using the spirometer as often as instructed.  Your pain medication is not giving enough relief while using the spirometer.  You develop fever of 100.5 F (38.1 C) or higher. SEEK IMMEDIATE MEDICAL CARE IF:   You cough up bloody sputum that had not been present before.  You develop fever of 102 F (38.9 C) or greater.  You develop worsening pain at or near the incision site. MAKE SURE YOU:   Understand these instructions.  Will watch your condition.  Will get help right away if you are not doing well or get worse. Document Released: 05/20/2006 Document Revised: 04/01/2011 Document Reviewed: 07/21/2006 [employer] Patient Information 2014 [employer], [place].   ________________________________________________________________________  WHAT IS A BLOOD TRANSFUSION? Blood Transfusion Information  A transfusion is the replacement of blood or some of its parts. Blood is made up of multiple cells which provide different functions.  Red blood cells carry oxygen and are used for blood loss replacement.  White blood cells fight against infection.  Platelets control bleeding.  Plasma helps clot blood.  Other blood products are available for specialized needs, such as hemophilia or other clotting disorders. BEFORE THE TRANSFUSION  Who gives blood for transfusions?   Healthy volunteers who are fully evaluated to make sure their blood is safe. This is blood bank blood. Transfusion therapy is the safest it has ever been in the practice of medicine. Before blood is taken from a donor, a complete history is taken to make sure that person has no history of diseases nor engages in risky social behavior (examples are intravenous drug use or sexual activity with multiple  partners). The donor's travel history is screened to minimize risk of transmitting infections, such as malaria. The donated blood is tested for signs of infectious diseases, such as HIV and hepatitis. The blood is then tested to be sure it is compatible with you in order to minimize the chance of a transfusion reaction. If you or a relative donates blood, this is often done in anticipation of surgery and is not appropriate for emergency situations. It takes many days to process the donated blood. RISKS AND COMPLICATIONS Although transfusion therapy is very safe and saves many lives, the main dangers of transfusion include:   Getting an infectious disease.  Developing a transfusion reaction. This is an allergic reaction to something in the  blood you were given. Every precaution is taken to prevent this. The decision to have a blood transfusion has been considered carefully by your caregiver before blood is given. Blood is not given unless the benefits outweigh the risks. AFTER THE TRANSFUSION  Right after receiving a blood transfusion, you will usually feel much better and more energetic. This is especially true if your red blood cells have gotten low (anemic). The transfusion raises the level of the red blood cells which carry oxygen, and this usually causes an energy increase.  The nurse administering the transfusion will monitor you carefully for complications. HOME CARE INSTRUCTIONS  No special instructions are needed after a transfusion. You may find your energy is better. Speak with your caregiver about any limitations on activity for underlying diseases you may have. SEEK MEDICAL CARE IF:   Your condition is not improving after your transfusion.  You develop redness or irritation at the intravenous (IV) site. SEEK IMMEDIATE MEDICAL CARE IF:  Any of the following symptoms occur over the next 12 hours:  Shaking chills.  You have a temperature by mouth above 102 F (38.9 C), not  controlled by medicine.  Chest, back, or muscle pain.  People around you feel you are not acting correctly or are confused.  Shortness of breath or difficulty breathing.  Dizziness and fainting.  You get a rash or develop hives.  You have a decrease in urine output.  Your urine turns a dark color or changes to pink, red, or brown. Any of the following symptoms occur over the next 10 days:  You have a temperature by mouth above 102 F (38.9 C), not controlled by medicine.  Shortness of breath.  Weakness after normal activity.  The white part of the eye turns yellow (jaundice).  You have a decrease in the amount of urine or are urinating less often.  Your urine turns a dark color or changes to pink, red, or brown. Document Released: 01/05/2000 Document Revised: 04/01/2011 Document Reviewed: 08/24/2007 [facility] Patient Information 2014 [place], [place].  _______________________________________________________________________

## 2019-04-15 ENCOUNTER — Telehealth: Payer: Self-pay | Admitting: Family Medicine

## 2019-04-15 ENCOUNTER — Other Ambulatory Visit (HOSPITAL_COMMUNITY)
Admission: RE | Admit: 2019-04-15 | Discharge: 2019-04-15 | Disposition: A | Discharge status: Home / Self Care | Payer: BC Managed Care – PPO | Source: Ambulatory Visit | Attending: Gynecologic Oncology | Admitting: Gynecologic Oncology

## 2019-04-15 ENCOUNTER — Telehealth: Payer: Self-pay | Admitting: *Deleted

## 2019-04-15 DIAGNOSIS — Z20822 Contact with and (suspected) exposure to covid-19: Secondary | ICD-10-CM | POA: Insufficient documentation

## 2019-04-15 DIAGNOSIS — Z01812 Encounter for preprocedural laboratory examination: Secondary | ICD-10-CM | POA: Diagnosis not present

## 2019-04-15 LAB — SARS CORONAVIRUS 2 (TAT 6-24 HRS): SARS Coronavirus 2: NEGATIVE

## 2019-04-15 NOTE — Telephone Encounter (Signed)
Refax records and clearance form to PCP

## 2019-04-15 NOTE — Telephone Encounter (Signed)
[facility] called regarding surgery consent papers that were faxed over. They haven't heard back and pts surgery is on 3/29. They stated that they can/ are sending them again and the fax to send it back is on the form. Office number is [phone] Please advise.

## 2019-04-16 ENCOUNTER — Telehealth: Payer: Self-pay

## 2019-04-16 NOTE — Telephone Encounter (Signed)
[facility] called again for surgery clarence. Forms are being faxed over they states they need the clarence by the end of today because her surgery is on Monday 3/29. Please advise.

## 2019-04-16 NOTE — Telephone Encounter (Signed)
Kelsey Simmons states that she understands her pre op instructions and has no questions at this time.

## 2019-04-16 NOTE — Telephone Encounter (Signed)
This pt is no longer a patient of this practice. Surgery clearance forms were faxed her for Creta Levin to complete from [facility] . Pt shows Lezlie Lye as pcp and the referring provider of procedure to be performed. Fax was sent to gyn oncology, [facility] (new pcp) and pt was called to make aware

## 2019-04-16 NOTE — Telephone Encounter (Signed)
Being that pcp is out of the office, giving to [place] to sign off on clearance. Pt surgery is Monday

## 2019-04-17 NOTE — Anesthesia Preprocedure Evaluation (Addendum)
Anesthesia Evaluation  Patient identified by MRN, date of birth, ID band Patient awake    Reviewed: Allergy & Precautions  Airway Mallampati: II  TM Distance: >3 FB     Dental   Pulmonary sleep apnea ,    breath sounds clear to auscultation       Cardiovascular hypertension,  Rhythm:Regular Rate:Normal     Neuro/Psych  Headaches, PSYCHIATRIC DISORDERS Anxiety Depression  Neuromuscular disease    GI/Hepatic Neg liver ROS, GERD  ,  Endo/Other  diabetes  Renal/GU negative Renal ROS     Musculoskeletal  (+) Fibromyalgia -  Abdominal   Peds  Hematology  (+) anemia ,   Anesthesia Other Findings   Reproductive/Obstetrics                            Anesthesia Physical Anesthesia Plan  ASA: III  Anesthesia Plan: General   Post-op Pain Management:    Induction: Intravenous  PONV Risk Score and Plan: 3 and Dexamethasone, Ondansetron and Midazolam  Airway Management Planned: Oral ETT  Additional Equipment:   Intra-op Plan:   Post-operative Plan: Possible Post-op intubation/ventilation  Informed Consent: I have reviewed the patients History and Physical, chart, labs and discussed the procedure including the risks, benefits and alternatives for the proposed anesthesia with the patient or authorized representative who has indicated his/her understanding and acceptance.     Dental advisory given  Plan Discussed with: Anesthesiologist  Anesthesia Plan Comments:        Anesthesia Quick Evaluation

## 2019-04-19 ENCOUNTER — Encounter (HOSPITAL_COMMUNITY): Payer: Self-pay | Admitting: Gynecologic Oncology

## 2019-04-19 ENCOUNTER — Ambulatory Visit (HOSPITAL_COMMUNITY)
Admission: RE | Admit: 2019-04-19 | Discharge: 2019-04-19 | Disposition: A | Discharge status: Home / Self Care | Payer: BC Managed Care – PPO | Attending: Gynecologic Oncology | Admitting: Gynecologic Oncology

## 2019-04-19 ENCOUNTER — Ambulatory Visit (HOSPITAL_COMMUNITY): Payer: BC Managed Care – PPO | Admitting: Certified Registered Nurse Anesthetist

## 2019-04-19 ENCOUNTER — Encounter (HOSPITAL_COMMUNITY)
Admission: RE | Disposition: A | Discharge status: Home / Self Care | Payer: Self-pay | Source: Home / Self Care | Attending: Gynecologic Oncology

## 2019-04-19 DIAGNOSIS — G4730 Sleep apnea, unspecified: Secondary | ICD-10-CM | POA: Insufficient documentation

## 2019-04-19 DIAGNOSIS — N83202 Unspecified ovarian cyst, left side: Secondary | ICD-10-CM | POA: Diagnosis not present

## 2019-04-19 DIAGNOSIS — Z780 Asymptomatic menopausal state: Secondary | ICD-10-CM | POA: Diagnosis not present

## 2019-04-19 DIAGNOSIS — Z791 Long term (current) use of non-steroidal anti-inflammatories (NSAID): Secondary | ICD-10-CM | POA: Insufficient documentation

## 2019-04-19 DIAGNOSIS — D509 Iron deficiency anemia, unspecified: Secondary | ICD-10-CM | POA: Diagnosis not present

## 2019-04-19 DIAGNOSIS — Z79899 Other long term (current) drug therapy: Secondary | ICD-10-CM | POA: Diagnosis not present

## 2019-04-19 DIAGNOSIS — D271 Benign neoplasm of left ovary: Secondary | ICD-10-CM | POA: Diagnosis not present

## 2019-04-19 DIAGNOSIS — E119 Type 2 diabetes mellitus without complications: Secondary | ICD-10-CM | POA: Insufficient documentation

## 2019-04-19 DIAGNOSIS — M1990 Unspecified osteoarthritis, unspecified site: Secondary | ICD-10-CM | POA: Diagnosis not present

## 2019-04-19 DIAGNOSIS — M797 Fibromyalgia: Secondary | ICD-10-CM | POA: Diagnosis not present

## 2019-04-19 DIAGNOSIS — I10 Essential (primary) hypertension: Secondary | ICD-10-CM | POA: Diagnosis not present

## 2019-04-19 HISTORY — PX: ROBOTIC ASSISTED SALPINGO OOPHERECTOMY: SHX6082

## 2019-04-19 HISTORY — PX: ROBOTIC ASSISTED LAPAROSCOPIC LYSIS OF ADHESION: SHX6080

## 2019-04-19 LAB — GLUCOSE, CAPILLARY
Glucose-Capillary: 92 mg/dL (ref 70–99)
Glucose-Capillary: 127 mg/dL — ABNORMAL HIGH (ref 70–99)

## 2019-04-19 LAB — TYPE AND SCREEN
ABO/RH(D): B POS
Antibody Screen: NEGATIVE

## 2019-04-19 SURGERY — SALPINGO-OOPHORECTOMY, ROBOT-ASSISTED
Anesthesia: General

## 2019-04-19 MED ORDER — ROCURONIUM BROMIDE 50 MG/5ML IV SOSY
PREFILLED_SYRINGE | INTRAVENOUS | Status: DC | PRN
  Administered 2019-04-19: 55 mg via INTRAVENOUS
  Administered 2019-04-19: 10 mg via INTRAVENOUS

## 2019-04-19 MED ORDER — MIDAZOLAM HCL 5 MG/5ML IJ SOLN
INTRAMUSCULAR | Status: DC | PRN
  Administered 2019-04-19: 2 mg via INTRAVENOUS

## 2019-04-19 MED ORDER — FENTANYL CITRATE (PF) 100 MCG/2ML IJ SOLN
INTRAMUSCULAR | Status: AC
  Filled 2019-04-19: qty 2

## 2019-04-19 MED ORDER — PROPOFOL 10 MG/ML IV BOLUS
INTRAVENOUS | Status: DC | PRN
  Administered 2019-04-19: 150 mg via INTRAVENOUS

## 2019-04-19 MED ORDER — PHENYLEPHRINE HCL-NACL 10-0.9 MG/250ML-% IV SOLN
INTRAVENOUS | Status: DC | PRN
  Administered 2019-04-19: 20 ug/min via INTRAVENOUS

## 2019-04-19 MED ORDER — BUPIVACAINE HCL 0.25 % IJ SOLN
INTRAMUSCULAR | Status: AC
  Filled 2019-04-19: qty 1

## 2019-04-19 MED ORDER — ROCURONIUM BROMIDE 10 MG/ML (PF) SYRINGE
PREFILLED_SYRINGE | INTRAVENOUS | Status: AC
  Filled 2019-04-19: qty 10

## 2019-04-19 MED ORDER — ALBUMIN HUMAN 5 % IV SOLN
INTRAVENOUS | Status: AC
  Filled 2019-04-19: qty 250

## 2019-04-19 MED ORDER — STERILE WATER FOR IRRIGATION IR SOLN
Status: DC | PRN
  Administered 2019-04-19: 1000 mL

## 2019-04-19 MED ORDER — LIDOCAINE 2% (20 MG/ML) 5 ML SYRINGE
INTRAMUSCULAR | Status: DC | PRN
  Administered 2019-04-19: 100 mg via INTRAVENOUS

## 2019-04-19 MED ORDER — GABAPENTIN 300 MG PO CAPS
300.0000 mg | ORAL_CAPSULE | ORAL | Status: AC
  Administered 2019-04-19: 07:00:00 300 mg via ORAL
  Filled 2019-04-19: qty 1

## 2019-04-19 MED ORDER — PHENYLEPHRINE HCL (PRESSORS) 10 MG/ML IV SOLN
INTRAVENOUS | Status: AC
  Filled 2019-04-19: qty 1

## 2019-04-19 MED ORDER — ONDANSETRON HCL 4 MG/2ML IJ SOLN
INTRAMUSCULAR | Status: DC | PRN
  Administered 2019-04-19: 4 mg via INTRAVENOUS

## 2019-04-19 MED ORDER — FENTANYL CITRATE (PF) 250 MCG/5ML IJ SOLN
INTRAMUSCULAR | Status: AC
  Filled 2019-04-19: qty 5

## 2019-04-19 MED ORDER — PHENYLEPHRINE 40 MCG/ML (10ML) SYRINGE FOR IV PUSH (FOR BLOOD PRESSURE SUPPORT)
PREFILLED_SYRINGE | INTRAVENOUS | Status: DC | PRN
  Administered 2019-04-19 (×4): 80 ug via INTRAVENOUS

## 2019-04-19 MED ORDER — DEXAMETHASONE SODIUM PHOSPHATE 4 MG/ML IJ SOLN
4.0000 mg | INTRAMUSCULAR | Status: AC
  Administered 2019-04-19: 4 mg via INTRAVENOUS

## 2019-04-19 MED ORDER — PROPOFOL 10 MG/ML IV BOLUS
INTRAVENOUS | Status: AC
  Filled 2019-04-19: qty 20

## 2019-04-19 MED ORDER — MIDAZOLAM HCL 2 MG/2ML IJ SOLN
INTRAMUSCULAR | Status: AC
  Filled 2019-04-19: qty 2

## 2019-04-19 MED ORDER — CELECOXIB 200 MG PO CAPS
400.0000 mg | ORAL_CAPSULE | ORAL | Status: AC
  Administered 2019-04-19: 400 mg via ORAL
  Filled 2019-04-19: qty 2

## 2019-04-19 MED ORDER — LACTATED RINGERS IR SOLN
Status: DC | PRN
  Administered 2019-04-19: 1000 mL

## 2019-04-19 MED ORDER — SUGAMMADEX SODIUM 500 MG/5ML IV SOLN
INTRAVENOUS | Status: DC | PRN
  Administered 2019-04-19: 250 mg via INTRAVENOUS

## 2019-04-19 MED ORDER — ACETAMINOPHEN 500 MG PO TABS
1000.0000 mg | ORAL_TABLET | ORAL | Status: AC
  Administered 2019-04-19: 07:00:00 1000 mg via ORAL
  Filled 2019-04-19: qty 2

## 2019-04-19 MED ORDER — SUGAMMADEX SODIUM 500 MG/5ML IV SOLN
INTRAVENOUS | Status: AC
  Filled 2019-04-19: qty 5

## 2019-04-19 MED ORDER — FENTANYL CITRATE (PF) 100 MCG/2ML IJ SOLN
INTRAMUSCULAR | Status: DC | PRN
  Administered 2019-04-19 (×7): 50 ug via INTRAVENOUS

## 2019-04-19 MED ORDER — LIDOCAINE 2% (20 MG/ML) 5 ML SYRINGE
INTRAMUSCULAR | Status: AC
  Filled 2019-04-19: qty 5

## 2019-04-19 MED ORDER — SCOPOLAMINE 1 MG/3DAYS TD PT72
1.0000 | MEDICATED_PATCH | TRANSDERMAL | Status: DC
  Administered 2019-04-19: 07:00:00 1.5 mg via TRANSDERMAL
  Filled 2019-04-19: qty 1

## 2019-04-19 MED ORDER — ALBUMIN HUMAN 5 % IV SOLN: INTRAVENOUS | Status: DC | PRN

## 2019-04-19 MED ORDER — ONDANSETRON HCL 4 MG/2ML IJ SOLN
INTRAMUSCULAR | Status: AC
  Filled 2019-04-19: qty 2

## 2019-04-19 MED ORDER — HEPARIN SODIUM (PORCINE) 5000 UNIT/ML IJ SOLN
5000.0000 [IU] | INTRAMUSCULAR | Status: AC
  Administered 2019-04-19: 07:00:00 5000 [IU] via SUBCUTANEOUS
  Filled 2019-04-19: qty 1

## 2019-04-19 MED ORDER — BUPIVACAINE HCL 0.25 % IJ SOLN
INTRAMUSCULAR | Status: DC | PRN
  Administered 2019-04-19: 34 mL

## 2019-04-19 MED ORDER — LACTATED RINGERS IV SOLN: INTRAVENOUS | Status: DC

## 2019-04-19 SURGICAL SUPPLY — 75 items
ADH SKN CLS APL DERMABOND .7 (GAUZE/BANDAGES/DRESSINGS)
AGENT HMST KT MTR STRL THRMB (HEMOSTASIS)
APL ESCP 34 STRL LF DISP (HEMOSTASIS)
APL SKNCLS STERI-STRIP NONHPOA (GAUZE/BANDAGES/DRESSINGS) ×2
APPLICATOR SURGIFLO ENDO (HEMOSTASIS) IMPLANT
BAG LAPAROSCOPIC 12 15 PORT 16 (BASKET) IMPLANT
BAG RETRIEVAL 12/15 (BASKET)
BAG SPEC RTRVL LRG 6X4 10 (ENDOMECHANICALS)
BENZOIN TINCTURE PRP APPL 2/3 (GAUZE/BANDAGES/DRESSINGS) ×1 IMPLANT
BLADE SURG SZ10 CARB STEEL (BLADE) IMPLANT
COVER BACK TABLE 60X90IN (DRAPES) ×3 IMPLANT
COVER TIP SHEARS 8 DVNC (MISCELLANEOUS) ×2 IMPLANT
COVER TIP SHEARS 8MM DA VINCI (MISCELLANEOUS) ×3
COVER WAND RF STERILE (DRAPES) IMPLANT
DECANTER SPIKE VIAL GLASS SM (MISCELLANEOUS) ×1 IMPLANT
DERMABOND ADVANCED (GAUZE/BANDAGES/DRESSINGS)
DERMABOND ADVANCED .7 DNX12 (GAUZE/BANDAGES/DRESSINGS) ×2 IMPLANT
DRAPE ARM DVNC X/XI (DISPOSABLE) ×8 IMPLANT
DRAPE COLUMN DVNC XI (DISPOSABLE) ×2 IMPLANT
DRAPE DA VINCI XI ARM (DISPOSABLE) ×9
DRAPE DA VINCI XI COLUMN (DISPOSABLE) ×3
DRAPE SHEET LG 3/4 BI-LAMINATE (DRAPES) ×3 IMPLANT
DRAPE SURG IRRIG POUCH 19X23 (DRAPES) ×3 IMPLANT
DRSG OPSITE POSTOP 4X6 (GAUZE/BANDAGES/DRESSINGS) IMPLANT
DRSG OPSITE POSTOP 4X8 (GAUZE/BANDAGES/DRESSINGS) IMPLANT
DRSG TEGADERM 2-3/8X2-3/4 SM (GAUZE/BANDAGES/DRESSINGS) ×4 IMPLANT
ELECT REM PT RETURN 15FT ADLT (MISCELLANEOUS) ×3 IMPLANT
GAUZE SPONGE 2X2 8PLY STRL LF (GAUZE/BANDAGES/DRESSINGS) IMPLANT
GLOVE BIO SURGEON STRL SZ 6 (GLOVE) ×12 IMPLANT
GLOVE BIO SURGEON STRL SZ 6.5 (GLOVE) IMPLANT
GOWN STRL REUS W/ TWL LRG LVL3 (GOWN DISPOSABLE) ×8 IMPLANT
GOWN STRL REUS W/TWL LRG LVL3 (GOWN DISPOSABLE) ×12
HOLDER FOLEY CATH W/STRAP (MISCELLANEOUS) ×2 IMPLANT
IRRIG SUCT STRYKERFLOW 2 WTIP (MISCELLANEOUS) ×3
IRRIGATION SUCT STRKRFLW 2 WTP (MISCELLANEOUS) ×2 IMPLANT
KIT PROCEDURE DA VINCI SI (MISCELLANEOUS)
KIT PROCEDURE DVNC SI (MISCELLANEOUS) IMPLANT
KIT TURNOVER KIT A (KITS) IMPLANT
MANIPULATOR UTERINE 4.5 ZUMI (MISCELLANEOUS) ×2 IMPLANT
NDL HYPO 21X1.5 SAFETY (NEEDLE) ×2 IMPLANT
NDL SPNL 18GX3.5 QUINCKE PK (NEEDLE) IMPLANT
NEEDLE HYPO 21X1.5 SAFETY (NEEDLE) ×3 IMPLANT
NEEDLE SPNL 18GX3.5 QUINCKE PK (NEEDLE) IMPLANT
OBTURATOR OPTICAL STANDARD 8MM (TROCAR) ×3
OBTURATOR OPTICAL STND 8 DVNC (TROCAR) ×2
OBTURATOR OPTICALSTD 8 DVNC (TROCAR) ×2 IMPLANT
PACK ROBOT GYN CUSTOM WL (TRAY / TRAY PROCEDURE) ×3 IMPLANT
PAD POSITIONING PINK XL (MISCELLANEOUS) ×3 IMPLANT
PENCIL SMOKE EVACUATOR (MISCELLANEOUS) IMPLANT
PORT ACCESS TROCAR AIRSEAL 12 (TROCAR) ×2 IMPLANT
PORT ACCESS TROCAR AIRSEAL 5M (TROCAR) ×1
POUCH SPECIMEN RETRIEVAL 10MM (ENDOMECHANICALS) IMPLANT
SCRUB CHG 4% DYNA-HEX 4OZ (MISCELLANEOUS) ×2 IMPLANT
SEAL CANN UNIV 5-8 DVNC XI (MISCELLANEOUS) ×6 IMPLANT
SEAL XI 5MM-8MM UNIVERSAL (MISCELLANEOUS) ×9
SET TRI-LUMEN FLTR TB AIRSEAL (TUBING) ×3 IMPLANT
SPONGE GAUZE 2X2 STER 10/PKG (GAUZE/BANDAGES/DRESSINGS) ×1
SPONGE LAP 18X18 RF (DISPOSABLE) IMPLANT
STRIP CLOSURE SKIN 1/2X4 (GAUZE/BANDAGES/DRESSINGS) ×1 IMPLANT
SURGIFLO W/THROMBIN 8M KIT (HEMOSTASIS) IMPLANT
SUT MNCRL AB 4-0 PS2 18 (SUTURE) IMPLANT
SUT PDS AB 1 TP1 96 (SUTURE) IMPLANT
SUT VIC AB 0 CT1 27 (SUTURE)
SUT VIC AB 0 CT1 27XBRD ANTBC (SUTURE) IMPLANT
SUT VIC AB 2-0 CT1 27 (SUTURE)
SUT VIC AB 2-0 CT1 TAPERPNT 27 (SUTURE) IMPLANT
SUT VICRYL 4-0 PS2 18IN ABS (SUTURE) ×6 IMPLANT
SYR 10ML LL (SYRINGE) IMPLANT
TOWEL OR NON WOVEN STRL DISP B (DISPOSABLE) ×3 IMPLANT
TRAP SPECIMEN MUCOUS 40CC (MISCELLANEOUS) ×1 IMPLANT
TRAY FOLEY MTR SLVR 16FR STAT (SET/KITS/TRAYS/PACK) ×3 IMPLANT
TROCAR XCEL NON-BLD 5MMX100MML (ENDOMECHANICALS) IMPLANT
UNDERPAD 30X36 HEAVY ABSORB (UNDERPADS AND DIAPERS) ×3 IMPLANT
WATER STERILE IRR 1000ML POUR (IV SOLUTION) ×3 IMPLANT
YANKAUER SUCT BULB TIP 10FT TU (MISCELLANEOUS) IMPLANT

## 2019-04-19 NOTE — Brief Op Note (Signed)
04/19/2019  10:38 AM  PATIENT:  Kelsey Simmons  59 y.o. female  PRE-OPERATIVE DIAGNOSIS:  LEFT OVARIAN CYST  POST-OPERATIVE DIAGNOSIS:  LEFT OVARIAN CYST  PROCEDURE:  Procedure(s): XI ROBOTIC ASSISTED SALPINGO OOPHORECTOMY (Left) XI ROBOTIC ASSISTED LAPAROSCOPIC LYSIS OF ADHESION (N/A)  SURGEON:  Surgeon(s) and Role:    Carver Fila, MD - Primary  PHYSICIAN ASSISTANT: Warner Mccreedy, NP  ANESTHESIA:   general  EBL:  25 mL   BLOOD ADMINISTERED:none  DRAINS: none   LOCAL MEDICATIONS USED:  BUPIVICAINE   SPECIMEN:  No Specimen  DISPOSITION OF SPECIMEN:  PATHOLOGY  COUNTS:  YES  TOURNIQUET:  * No tourniquets in log *  DICTATION: .Note written in EPIC  PLAN OF CARE: Discharge to home after PACU  PATIENT DISPOSITION:  PACU - hemodynamically stable.   Delay start of Pharmacological VTE agent (>24hrs) due to surgical blood loss or risk of bleeding: not applicable

## 2019-04-19 NOTE — Telephone Encounter (Signed)
After further review, this pt is not a pt of [facility] for right now. Per the pt, she will be returning to [facility] once present insurance changes. A fax and courtesy call was sent to the ordering pcp to make them aware of the mix up.

## 2019-04-19 NOTE — Discharge Instructions (Signed)
04/19/2019  Return to work: 3/29  Activity: 1. Be up and out of the bed during the day.  Take a nap if needed.  You may walk up steps but be careful and use the hand rail.  Stair climbing will tire you more than you think, you may need to stop part way and rest.   2. No lifting or straining for 6 weeks.  3. No driving for 1-2 weeks.  Do Not drive if you are taking narcotic pain medicine and until you can brake safely.  4. Shower daily.  Use soap and water on your incision and pat dry; don't rub.   5. No sexual activity and nothing in the vagina for 4 weeks.  Medications:  - Take ibuprofen and tylenol first line for pain control. Take these regularly (every 6 hours) to decrease the build up of pain.  - If necessary, for severe pain not relieved by ibuprofen, take oxycodone.  - While taking oxycodone you should take sennakot every night to reduce the likelihood of constipation. If this causes diarrhea, stop its use.  Diet: 1. Low sodium Heart Healthy Diet is recommended.  2. It is safe to use a laxative if you have difficulty moving your bowels.   Wound Care: 1. Keep clean and dry.  Shower daily.  2. Remove over dressing tomorrow morning (3/30). The steri-strips (white tape) can stay on for 7-10 days and then please remove.  Reasons to call the Doctor:   Fever - Oral temperature greater than 100.4 degrees Fahrenheit  Foul-smelling vaginal discharge  Difficulty urinating  Nausea and vomiting  Increased pain at the site of the incision that is unrelieved with pain medicine.  Difficulty breathing with or without chest pain  New calf pain especially if only on one side  Sudden, continuing increased vaginal bleeding with or without clots.   Follow-up: 1. See Eugene Garnet in 3 weeks.  Contacts: For questions or concerns you should contact:  Dr. Eugene Garnet at [phone] After hours and on week-ends call [phone] and ask to speak to the physician on call  for Gynecologic Oncology

## 2019-04-19 NOTE — Interval H&P Note (Signed)
History and Physical Interval Note:  04/19/2019 7:39 AM  Kelsey Simmons  has presented today for surgery, with the diagnosis of LEFT OVARIAN CYST.  The various methods of treatment have been discussed with the patient and family. After consideration of risks, benefits and other options for treatment, the patient has consented to  Procedure(s): XI ROBOTIC ASSISTED SALPINGO OOPHORECTOMY (Left) Possible LAPAROTOMY Possible STAGING (N/A) as a surgical intervention.  The patient's history has been reviewed, patient examined, no change in status, stable for surgery.  I have reviewed the patient's chart and labs.  Questions were answered to the patient's satisfaction.     Carver Fila

## 2019-04-19 NOTE — Transfer of Care (Signed)
Immediate Anesthesia Transfer of Care Note  Patient: Kelsey Simmons  Procedure(s) Performed: XI ROBOTIC ASSISTED SALPINGO OOPHORECTOMY (Left ) XI ROBOTIC ASSISTED LAPAROSCOPIC LYSIS OF ADHESION (N/A )  Patient Location: PACU  Anesthesia Type:General  Level of Consciousness: drowsy and patient cooperative  Airway & Oxygen Therapy: Patient Spontanous Breathing and Patient connected to face mask oxygen  Post-op Assessment: Report given to RN and Post -op Vital signs reviewed and stable  Post vital signs: Reviewed and stable  Last Vitals:  Vitals Value Taken Time  BP 112/52 04/19/19 1049  Temp    Pulse 96 04/19/19 1050  Resp 26 04/19/19 1051  SpO2 100 % 04/19/19 1050  Vitals shown include unvalidated device data.  Last Pain:  Vitals:   04/19/19 0654  PainSc: 0-No pain         Complications: No apparent anesthesia complications

## 2019-04-19 NOTE — Anesthesia Procedure Notes (Signed)
Procedure Name: Intubation Date/Time: 04/19/2019 8:11 AM Performed by: Montel Clock, CRNA Pre-anesthesia Checklist: Patient identified, Emergency Drugs available, Suction available, Patient being monitored and Timeout performed Patient Re-evaluated:Patient Re-evaluated prior to induction Oxygen Delivery Method: Circle system utilized Preoxygenation: Pre-oxygenation with 100% oxygen Induction Type: IV induction Ventilation: Mask ventilation without difficulty and Oral airway inserted - appropriate to patient size Laryngoscope Size: Mac and 3 Grade View: Grade I Tube type: Oral Tube size: 7.0 mm Number of attempts: 1 Airway Equipment and Method: Stylet Placement Confirmation: ETT inserted through vocal cords under direct vision,  positive ETCO2 and breath sounds checked- equal and bilateral Secured at: 21 cm Tube secured with: Tape Dental Injury: Teeth and Oropharynx as per pre-operative assessment

## 2019-04-19 NOTE — Anesthesia Postprocedure Evaluation (Signed)
Anesthesia Post Note  Patient: Kelsey Simmons  Procedure(s) Performed: XI ROBOTIC ASSISTED SALPINGO OOPHORECTOMY (Left ) XI ROBOTIC ASSISTED LAPAROSCOPIC LYSIS OF ADHESION (N/A )     Patient location during evaluation: PACU Anesthesia Type: General Level of consciousness: awake Pain management: pain level controlled Vital Signs Assessment: post-procedure vital signs reviewed and stable Respiratory status: spontaneous breathing Cardiovascular status: stable Postop Assessment: no apparent nausea or vomiting Anesthetic complications: no    Last Vitals:  Vitals:   04/19/19 1230 04/19/19 1245  BP: 106/64 114/64  Pulse: 95 93  Resp: 14   Temp: (!) 36.3 C   SpO2: 92% 97%    Last Pain:  Vitals:   04/19/19 1245  PainSc: 0-No pain                 Olive Motyka

## 2019-04-19 NOTE — Op Note (Signed)
OPERATIVE NOTE  Pre-operative Diagnosis: Enlarging left ovarian mass, normal Ca1 25  Post-operative Diagnosis: same, benign mucinous cyst on intraoperative frozen section  Operation: Robotic-assisted laparoscopic left salpingo-oophorectomy, lysis of adhesions for approximately 40 minutes  Surgeon: Jeral Pinch MD  Assistant Surgeon: Joylene John, NP  Anesthesia: GET  Urine Output: 75 cc  Operative Findings: On EUA, smooth mobile mass within the posterior cul-de-sac.  On intra-abdominal entry, smooth and normal-appearing liver edge and diaphragm.  Small bowel diffusely studded with white nodules, somewhat concerning for carcinomatosis, although no lesions or nodularity noted on the mesentery or pelvic/abdominal peritoneum.  Supraumbilical mesh noted with transverse colon mesentery adherent to it.  Some adhesions of the ascending colon to the right sidewall.  Normal-appearing appendix.  Normal-appearing large bowel.  Approximately 8 cm smooth mass replacing the left ovary, fallopian tube normal-appearing.  On contain drainage within an Endo Catch bag, clear mucinous fluid noted.  Other than appearance of the small bowel, no intra-abdominal or pelvic evidence of disease.  Estimated Blood Loss:  less than 50 mL      Total IV Fluids: 2000 ml         Specimens: Left fallopian tube and ovary         Complications:  None apparent; patient tolerated the procedure well.         Disposition: PACU - hemodynamically stable.  Procedure Details  The patient was seen in the Holding Room. The risks, benefits, complications, treatment options, and expected outcomes were discussed with the patient.  The patient concurred with the proposed plan, giving informed consent.  The site of surgery properly noted/marked. The patient was identified as Kelsey Simmons and the procedure verified as a Robotic-assisted unilateral salpingo oophorectomy.   After induction of anesthesia, the patient was draped and  prepped in the usual sterile manner. Patient was placed in supine position after anesthesia and draped and prepped in the usual sterile manner as follows: Her arms were tucked to her side with all appropriate precautions.  The shoulders were stabilized with padded shoulder blocks applied to the acromium processes.  The patient was placed in the semi-lithotomy position in [place].  The perineum and vagina were prepped with Betadine. The patient was draped.  A Time Out was held and the above information confirmed.  Foley catheter was placed.  OG tube placement was confirmed and to suction.   Next, a 10 mm skin incision was made 1 cm below the subcostal margin in the midclavicular line.  The 5 mm Optiview port and scope was used for direct entry.  Opening pressure was under 10 mm CO2.  The abdomen was insufflated and the findings were noted as above.   At this point and all points during the procedure, the patient's intra-abdominal pressure did not exceed 15 mmHg. Next, an 8 mm skin incision was made in the right abdomen and 2 in the left.  An additional incision was made on the right but upon further examination, it appeared that this was over the area the mesh and was not utilized.  The periumbilical region was avoided given hernia repair with mesh.  The 5 mm assist trocar was exchanged for a 10-12 mm port. All ports were placed under direct visualization.  The patient was placed in steep Trendelenburg.  Bowel was already noted to be in the upper abdomen.  The robot was docked in the normal manner.  The left peritoneum were opened parallel to the IP ligament to open the retroperitoneal spaces  bilaterally.  This was somewhat challenging given the distortion of the left sidewall from the mass and adhesions of the sigmoid mesentery to the left sidewall.  The remnant round ligament was identified, cauterized and transected.  The round ligaments were transected. The ureter was noted to be on the medial leaf  of the broad ligament.  Combination of sharp dissection and electrocautery was used to lyse adhesions of the sigmoid mesentery to the IP ligament and sidewall.  The peritoneum above the ureter was incised and stretched and the infundibulopelvic ligament was skeletonized, cauterized and cut.  Blunt dissection and electrocautery were then used to dissect the left ovarian mass from the broad ligament until it was ultimately freed from the sidewall, maintaining visualization of the ureter inferiorly.  The left adnexa was then placed in an Endo Catch bag and brought to the skin through the assist trocar.  Robotic instruments were removed from the abdomen and the left most lateral arm was undocked.  The mass was punctured using a scalpel and contain drainage of the mass was performed until ultimately the mass was able to be removed in piecemeal fashion without spillage.  The mass was then handed off the field to be sent to pathology for intraoperative frozen section.  Under direct visualization, the assist trocar was replaced, the left lateral arm was redocked and instruments were reinserted into the abdomen.  Irrigation was used and excellent hemostasis was achieved.  Attention was turned to the upper abdomen.  Some bleeding was noted from the right sidewall where the adhesion of the ascending colon appeared to have been torn after insufflation.  This was carefully examined and electrocautery was used to achieve hemostasis along the bleeding edge.    Pathology called with intraoperative frozen section consistent with a benign mucinous cyst.  At this point in the procedure was completed.  Robotic instruments were removed under direct visulaization.  The robot was undocked. The deep subcutaneous tissue at the 10-12 mm port was closed with 0 Vicryl on a UR-5 needle.  The subcuticular tissue was closed with 4-0 Vicryl and the skin was closed with 4-0 Monocryl in a subcuticular manner.  Benzoin and Steri-Strips were  applied to all incisions and then they were covered with a 4 x 4 and Tegaderm.  Foley catheter was removed.  All sponge, lap and needle counts were correct x  3.   The patient was transferred to the recovery room in stable condition.  Eugene Garnet, MD

## 2019-04-20 ENCOUNTER — Telehealth: Payer: Self-pay

## 2019-04-20 LAB — SURGICAL PATHOLOGY

## 2019-04-20 LAB — CYTOLOGY - NON PAP

## 2019-04-20 NOTE — Telephone Encounter (Signed)
Kelsey Simmons states that she is eating, drinking, and urinating well. She is passing gas. Pt will add Miralax 1 capful bid prn today as this helps her bowels move. She feels the stool in the rectal area. She did take Senakot-S as directed last evening. Afebrile. Incisions are D&I. She has steri strips on incisions with gauze and tape over them as she is allergic to [employer]. Told her that she can remove the gauze today or tomorrow morning. Do not remove the steri strips. They will come off on there own. Pt using Tylenol alt with Oxycodone for pain. Told her that she can use the tramadol for the pain if she feels that she does not need the stronger OxyIR. Kelsey Simmons aware of her post op appointments with Dr. Pricilla Holm and the office phone number [phone] if she has any questions or concerns.

## 2019-04-21 LAB — CEA: CEA: 2 ng/mL (ref 0.0–4.7)

## 2019-04-27 ENCOUNTER — Ambulatory Visit: Payer: BC Managed Care – PPO | Admitting: Gynecologic Oncology

## 2019-05-17 NOTE — Progress Notes (Signed)
Gynecologic Oncology Return Clinic Visit  4/27  Reason for Visit: post-op  Treatment History: Pelvic mass found at time of CT scan for ventral hernia. Repeat imaging (ultrasound) on 3/11 showed mass had grown, now 8x8.4x5cm, complex. No metastatic disease.  CA-125: 15 3/29: Robotic LSO, LOA - pathology benign  Interval History: Patient overall is doing well after surgery.  She denies any significant pain postop.  She has some ongoing constipation for which she is using Colace.  She denies any urinary symptoms.  She denies any vaginal bleeding or discharge.  She notes mild worsening of her hot flashes although only has them after she drinks a hot beverage and notes that they are very brief.  She denies any fevers or chills.  Past Medical/Surgical History: Past Medical History:  Diagnosis Date  . Anxiety   . Arthritis   . Depression 2020   in therapy but not on medications  . Diabetes mellitus without complication (HCC)    type 2   . Fibromyalgia   . Headache(784.0)    HX MIGRAINES  . History of kidney stones 2020   left parenchymal stone identified on CT 11/23/18   kidney cyst  . Hypertension   . Paresthesia of lower extremity   . Sleep apnea    HAS NOT USED C PAP SINCE 2007 DUE TO WT LOSS     Past Surgical History:  Procedure Laterality Date  . ABDOMINAL HYSTERECTOMY  1997  . BUNIONECTOMY  2012  . CESAREAN SECTION    . GASTRIC BYPASS  2006  . JOINT REPLACEMENT     RTKA 7'15  . ROBOTIC ASSISTED LAPAROSCOPIC LYSIS OF ADHESION N/A 04/19/2019   Procedure: XI ROBOTIC ASSISTED LAPAROSCOPIC LYSIS OF ADHESION;  Surgeon: Carver Fila, MD;  Location: [facility];  Service: Gynecology;  Laterality: N/A;  . ROBOTIC ASSISTED SALPINGO OOPHERECTOMY Left 04/19/2019   Procedure: XI ROBOTIC ASSISTED SALPINGO OOPHORECTOMY;  Surgeon: Carver Fila, MD;  Location: [facility];  Service: Gynecology;  Laterality: Left;  . TOTAL KNEE ARTHROPLASTY Right 08/18/2013   Procedure: RIGHT TOTAL KNEE  ARTHROPLASTY;  Surgeon: Jacki Cones, MD;  Location: [facility];  Service: Orthopedics;  Laterality: Right;  . TOTAL KNEE ARTHROPLASTY Left 04/13/2014   Procedure: LEFT TOTAL KNEE ARTHROPLASTY;  Surgeon: Ranee Gosselin, MD;  Location: [facility];  Service: Orthopedics;  Laterality: Left;  Marland Kitchen VENTRAL HERNIA REPAIR N/A 01/11/2019   Procedure: LAPAROSCOPIC VENTRAL HERNIA REPAIR WITH MESH;  Surgeon: Diamantina Monks, MD;  Location: [facility];  Service: General;  Laterality: N/A;    Family History  Problem Relation Age of Onset  . Cancer Maternal Grandmother        Either uterine or endometrial  . Colon cancer Neg Hx   . Breast cancer Neg Hx     Social History   Socioeconomic History  . Marital status: Married    Spouse name: Not on file  . Number of children: Not on file  . Years of education: Not on file  . Highest education level: Not on file  Occupational History  . Not on file  Tobacco Use  . Smoking status: Never Smoker  . Smokeless tobacco: Never Used  Substance and Sexual Activity  . Alcohol use: Not Currently  . Drug use: Not Currently  . Sexual activity: Not Currently  Other Topics Concern  . Not on file  Social History Narrative  . Not on file   Social Determinants of Health   Financial Resource Strain:   .  Difficulty of Paying Living Expenses:   Food Insecurity:   . Worried About Charity fundraiser in the Last Year:   . Arboriculturist in the Last Year:   Transportation Needs:   . Film/video editor (Medical):   Marland Kitchen Lack of Transportation (Non-Medical):   Physical Activity:   . Days of Exercise per Week:   . Minutes of Exercise per Session:   Stress:   . Feeling of Stress :   Social Connections:   . Frequency of Communication with Friends and Family:   . Frequency of Social Gatherings with Friends and Family:   . Attends Religious Services:   . Active Member of Clubs or Organizations:   . Attends Archivist Meetings:   Marland Kitchen Marital Status:      Current Medications:  Current Outpatient Medications:  .  acetaminophen (TYLENOL) 500 MG tablet, Take 1,000 mg by mouth every 6 (six) hours as needed., Disp: , Rfl:  .  amLODipine (NORVASC) 5 MG tablet, Take 5 mg by mouth daily. , Disp: , Rfl:  .  atorvastatin (LIPITOR) 20 MG tablet, Take 20 mg by mouth at bedtime., Disp: , Rfl:  .  butalbital-aspirin-caffeine (FIORINAL) 50-325-40 MG capsule, Take 1 capsule by mouth every 4 (four) hours as needed for headache. , Disp: , Rfl:  .  gabapentin (NEURONTIN) 600 MG tablet, Take 600 mg by mouth See admin instructions. Take 600mg  twice a day. Take additional 600mg  once a day as needed for pain., Disp: , Rfl:  .  hydrochlorothiazide (HYDRODIURIL) 25 MG tablet, Take 25 mg by mouth every morning., Disp: , Rfl:  .  OZEMPIC, 0.25 OR 0.5 MG/DOSE, 2 MG/1.5ML SOPN, Inject 0.5 mg into the skin every Wednesday. , Disp: , Rfl:  .  SUMAtriptan (IMITREX) 100 MG tablet, Take 100 mg by mouth every 2 (two) hours as needed for migraine or headache. May repeat in 2 hours if headache persists or recurs., Disp: , Rfl:  .  traMADol (ULTRAM) 50 MG tablet, Take 50 mg by mouth See admin instructions. Take 50mg  twice a day. Take additional 50mg  once a day as needed for pain., Disp: , Rfl:  .  Vitamin D, Ergocalciferol, (DRISDOL) 1.25 MG (50000 UNIT) CAPS capsule, Take 50,000 Units by mouth See admin instructions. Takes on Mondays and Thursdays., Disp: , Rfl:   Review of Systems: Denies appetite changes, fevers, chills, fatigue, unexplained weight changes. Denies hearing loss, neck lumps or masses, mouth sores, ringing in ears or voice changes. Denies cough or wheezing.  Denies shortness of breath. Denies chest pain or palpitations. Denies leg swelling. Denies abdominal distention, pain, blood in stools, constipation, diarrhea, nausea, vomiting, or early satiety. Denies pain with intercourse, dysuria, frequency, hematuria or incontinence. Denies pelvic pain, vaginal  bleeding or vaginal discharge.   Denies joint pain, back pain or muscle pain/cramps. Denies itching, rash, or wounds. Denies dizziness, headaches, numbness or seizures. Denies swollen lymph nodes or glands, denies easy bruising or bleeding. Denies anxiety, depression, confusion, or decreased concentration.  Physical Exam: BP 111/73 (BP Location: Left Arm, Patient Position: Sitting)   Pulse 86   Temp 98.3 F (36.8 C) (Temporal)   Resp 17   Ht 5\' 1"  (1.549 m)   Wt 231 lb 4.8 oz (104.9 kg)   SpO2 100%   BMI 43.70 kg/m  General: Alert, oriented, no acute distress. HEENT: Normocephalic, atraumatic, sclera anicteric. Chest: Unlabored breathing on room air. Abdomen: Obese, soft, nontender.  Normoactive bowel sounds.  No  masses or hepatosplenomegaly appreciated.  Well-healing incisions. Extremities: Grossly normal range of motion.  Warm, well perfused.  No edema bilaterally. Skin: No rashes or lesions noted. GU: Normal appearing external genitalia without erythema, excoriation, or lesions.  Speculum exam reveals vaginal cuff intact with no lesions.  Bimanual exam reveals cuff intact, no fluctuance or tenderness.    Laboratory & Radiologic Studies: A. OVARY AND FALLOPIAN TUBE, LEFT, SALPINGO OOPHORECTOMY:  - Benign mucinous cystadenoma.  - Benign fallopian tube.   Assessment & Plan: Kelsey Simmons is a 59 y.o. woman status post robotic LSO and lysis of adhesions for an adnexal mass with benign pathology.    The patient is doing exceptionally well after surgery.  Discussed continued activity restriction for the next couple of weeks.  Also discussed lifestyle modifications, mainly drinking cold beverages, to help limit hot flashes.  Patient will let me know if she develops worsening symptoms.  Otherwise, I am releasing her back to her PCP and gynecologist.  15 minutes of total time was spent for this patient encounter, including preparation, face-to-face counseling with the patient and  coordination of care, and documentation of the encounter.  Eugene Garnet, MD  Division of Gynecologic Oncology  Department of Obstetrics and Gynecology  [facility]

## 2019-05-18 ENCOUNTER — Inpatient Hospital Stay: Payer: Self-pay | Attending: Gynecologic Oncology | Admitting: Gynecologic Oncology

## 2019-05-18 ENCOUNTER — Encounter: Payer: Self-pay | Admitting: Gynecologic Oncology

## 2019-05-18 ENCOUNTER — Other Ambulatory Visit: Payer: Self-pay

## 2019-05-18 VITALS — BP 111/73 | HR 86 | Temp 98.3°F | Resp 17 | Ht 61.0 in | Wt 231.3 lb

## 2019-05-18 DIAGNOSIS — Z90710 Acquired absence of both cervix and uterus: Secondary | ICD-10-CM

## 2019-05-18 DIAGNOSIS — N83202 Unspecified ovarian cyst, left side: Secondary | ICD-10-CM | POA: Insufficient documentation

## 2019-05-18 DIAGNOSIS — Z90721 Acquired absence of ovaries, unilateral: Secondary | ICD-10-CM | POA: Insufficient documentation

## 2019-05-18 NOTE — Patient Instructions (Signed)
You are healing well from surgery!  Remember activity restrictions are in place for another several weeks.  If you have any questions or concerns moving forward, please do not hesitate to call me at [phone].

## 2019-05-28 ENCOUNTER — Telehealth: Payer: Self-pay

## 2019-05-28 NOTE — Telephone Encounter (Signed)
Told Ms Klatt that Kelsey Mccreedy, NP said that she can get the senokot over the counter an will not refill prescription . It was prescribed for the post operative period. Called [facility] on [address] and told the pharmacist that therefill will not be authorized.

## 2019-07-06 DIAGNOSIS — Z6841 Body Mass Index (BMI) 40.0 and over, adult: Secondary | ICD-10-CM | POA: Insufficient documentation

## 2019-07-20 ENCOUNTER — Encounter: Payer: BLUE CROSS/BLUE SHIELD | Admitting: Registered Nurse

## 2019-08-09 DIAGNOSIS — E1169 Type 2 diabetes mellitus with other specified complication: Secondary | ICD-10-CM | POA: Diagnosis not present

## 2019-08-09 DIAGNOSIS — E1165 Type 2 diabetes mellitus with hyperglycemia: Secondary | ICD-10-CM | POA: Diagnosis not present

## 2019-08-09 DIAGNOSIS — E1159 Type 2 diabetes mellitus with other circulatory complications: Secondary | ICD-10-CM | POA: Diagnosis not present

## 2019-08-09 DIAGNOSIS — I10 Essential (primary) hypertension: Secondary | ICD-10-CM | POA: Diagnosis not present

## 2019-08-10 ENCOUNTER — Encounter: Payer: Self-pay | Admitting: *Deleted

## 2019-08-11 ENCOUNTER — Encounter: Payer: Self-pay | Admitting: Diagnostic Neuroimaging

## 2019-08-11 ENCOUNTER — Ambulatory Visit (INDEPENDENT_AMBULATORY_CARE_PROVIDER_SITE_OTHER): Payer: BC Managed Care – PPO | Admitting: Diagnostic Neuroimaging

## 2019-08-11 VITALS — BP 108/72 | HR 74 | Ht 61.0 in | Wt 234.2 lb

## 2019-08-11 DIAGNOSIS — G5713 Meralgia paresthetica, bilateral lower limbs: Secondary | ICD-10-CM | POA: Diagnosis not present

## 2019-08-11 NOTE — Progress Notes (Signed)
[facility]  PATIENT: Kelsey Simmons DOB: 15-Jun-1960  REFERRING CLINICIAN: Callie FieldingBartko, Albert, MD HISTORY FROM: patient  REASON FOR VISIT: new consult    HISTORICAL  CHIEF COMPLAINT:  Chief Complaint  Patient presents with  . Meralgia Paresthetica    rm 7 New Pt "zapping, burning, numbness in both thighs that seem to have gotten worse right after surgery Dec 2020- epigastric hernia repair"    HISTORY OF PRESENT ILLNESS:   59 year old female here for evaluation of bilateral thigh numbness and burning sensation.  Patient had a workplace back injury about 20 years ago.  She fell to the ground.  She had significant pain in her lower back.  She has been treated with pain management since that time.  Around 15 years ago she noticed some pain burning and radiating into her bilateral thighs, worse with walking.  Patient had weight loss surgery in 2006.  In 2020 December patient had hernia surgery and when she woke up her thigh pain worsened.  She was treated with gabapentin with out significant relief.  She went to a neurologist for evaluation was diagnosed with meralgia paresthetica and recommended to continue gabapentin and follow-up with pain management.  Patient's pain management doctor has evaluated for this issue and considered nerve block injections.  Patient requested second opinion with another neurologist and came to our clinic today.   REVIEW OF SYSTEMS: Full 14 system review of systems performed and negative with exception of: As per HPI.  ALLERGIES: Allergies  Allergen Reactions  . Topiramate     Other reaction(s): double vision  . Chlorhexidine Gluconate Rash  . Tape Rash    DERMABOND    HOME MEDICATIONS: Outpatient Medications Prior to Visit  Medication Sig Dispense Refill  . acetaminophen (TYLENOL) 500 MG tablet Take 1,000 mg by mouth every 6 (six) hours as needed.    Marland Kitchen. amLODipine (NORVASC) 5 MG tablet Take 5 mg by mouth daily.     Marland Kitchen.  atorvastatin (LIPITOR) 20 MG tablet Take 20 mg by mouth at bedtime.    . butalbital-aspirin-caffeine (FIORINAL) 50-325-40 MG capsule Take 1 capsule by mouth every 4 (four) hours as needed for headache.     . DULoxetine (CYMBALTA) 30 MG capsule Take by mouth.    . gabapentin (NEURONTIN) 600 MG tablet Take 600 mg by mouth See admin instructions. Take 600mg  twice a day. Take additional 600mg  once a day as needed for pain.    . hydrochlorothiazide (HYDRODIURIL) 25 MG tablet Take 25 mg by mouth every morning.    Marland Kitchen. OZEMPIC, 0.25 OR 0.5 MG/DOSE, 2 MG/1.5ML SOPN Inject 0.5 mg into the skin every Wednesday.     . SUMAtriptan (IMITREX) 100 MG tablet Take 100 mg by mouth every 2 (two) hours as needed for migraine or headache. May repeat in 2 hours if headache persists or recurs.    . traMADol (ULTRAM) 50 MG tablet Take 50 mg by mouth See admin instructions. Take 50mg  twice a day. Take additional 50mg  once a day as needed for pain.    . Vitamin D, Ergocalciferol, (DRISDOL) 1.25 MG (50000 UNIT) CAPS capsule Take 50,000 Units by mouth See admin instructions. Takes on Mondays and Thursdays. (Patient not taking: Reported on 08/11/2019)     No facility-administered medications prior to visit.    PAST MEDICAL HISTORY: Past Medical History:  Diagnosis Date  . Anxiety   . Arthritis   . Bilateral sacroiliitis (HCC)   . Chronic back pain   .  Depression 2020   in therapy but not on medications  . Diabetes mellitus without complication (HCC)    type 2   . Fibromyalgia   . Headache(784.0)    HX MIGRAINES  . History of kidney stones 2020   left parenchymal stone identified on CT 11/23/18   kidney cyst  . Hypercholesterolemia   . Hypertension   . Lumbar spondylosis   . Lumbosacral neuritis   . Myofascial pain   . Paresthesia of lower extremity   . Sleep apnea    HAS NOT USED C PAP SINCE 2007 DUE TO WT LOSS   . Trochanteric bursitis    left hip    PAST SURGICAL HISTORY: Past Surgical History:   Procedure Laterality Date  . ABDOMINAL HYSTERECTOMY  1997  . BUNIONECTOMY  2012  . CESAREAN SECTION    . GASTRIC BYPASS  2006  . JOINT REPLACEMENT     RTKA 7'15  . ROBOTIC ASSISTED LAPAROSCOPIC LYSIS OF ADHESION N/A 04/19/2019   Procedure: XI ROBOTIC ASSISTED LAPAROSCOPIC LYSIS OF ADHESION;  Surgeon: Carver Fila, MD;  Location: [facility];  Service: Gynecology;  Laterality: N/A;  . ROBOTIC ASSISTED SALPINGO OOPHERECTOMY Left 04/19/2019   Procedure: XI ROBOTIC ASSISTED SALPINGO OOPHORECTOMY;  Surgeon: Carver Fila, MD;  [facility]: [facility];  Service: Gynecology;  Laterality: Left;  . 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 TOTAL KNEE ARTHROPLASTY Left 04/13/2014   Procedure: LEFT TOTAL KNEE ARTHROPLASTY;  Surgeon: Ranee Gosselin, MD;  [facility];  Service: Orthopedics;  Laterality: Left;  Marland Kitchen VENTRAL HERNIA REPAIR N/A 01/11/2019   Procedure: LAPAROSCOPIC VENTRAL HERNIA REPAIR WITH MESH;  Surgeon: Diamantina Monks, MD;  Location: [facility];  Service: General;  Laterality: N/A;    FAMILY HISTORY: Family History  Problem Relation Age of Onset  . Cancer Maternal Grandmother        Either uterine or endometrial  . Diabetes Mellitus II Mother   . Lupus Mother   . Lung cancer Father   . Diabetes Mellitus II Sister   . Diabetes Mellitus II Brother   . Hypercholesterolemia Brother   . Colon cancer Neg Hx   . Breast cancer Neg Hx     SOCIAL HISTORY: Social History   Socioeconomic History  . Marital status: Married    Spouse name: Lucious  . Number of children: 2  . Years of education: Not on file  . Highest education level: High school graduate  Occupational History  . Not on file  Tobacco Use  . Smoking status: Never Smoker  . Smokeless tobacco: Never Used  Vaping Use  . Vaping Use: Never used  Substance and Sexual Activity  .  Alcohol use: Not Currently    Comment: quit 1994  . Drug use: Not Currently    Comment: quit 1994  . Sexual activity: Not Currently  Other Topics Concern  . Not on file  Social History Narrative   Lives with husband   Caffeine- 11 oz c daily   Social Determinants of Health   Financial Resource Strain:   . Difficulty of Paying Living Expenses:   Food Insecurity:   . Worried About Programme researcher, broadcasting/film/video in the Last Year:   . Barista in the Last Year:   Transportation Needs:   . Freight forwarder (Medical):   Marland Kitchen Lack of Transportation (  Non-Medical):   Physical Activity:   . Days of Exercise per Week:   . Minutes of Exercise per Session:   Stress:   . Feeling of Stress :   Social Connections:   . Frequency of Communication with Friends and Family:   . Frequency of Social Gatherings with Friends and Family:   . Attends Religious Services:   . Active Member of Clubs or Organizations:   . Attends Banker Meetings:   Marland Kitchen Marital Status:   Intimate Partner Violence:   . Fear of Current or Ex-Partner:   . Emotionally Abused:   Marland Kitchen Physically Abused:   . Sexually Abused:      PHYSICAL EXAM  GENERAL EXAM/CONSTITUTIONAL: Vitals:  Vitals:   08/11/19 0815  BP: 108/72  Pulse: 74  Weight: 234 lb 3.2 oz (106.2 kg)  Height: 5\' 1"  (1.549 m)     Body mass index is 44.25 kg/m. Wt Readings from Last 3 Encounters:  08/11/19 234 lb 3.2 oz (106.2 kg)  05/18/19 231 lb 4.8 oz (104.9 kg)  04/14/19 242 lb 8 oz (110 kg)     Patient is in no distress; well developed, nourished and groomed; neck is supple  CARDIOVASCULAR:  Examination of carotid arteries is normal; no carotid bruits  Regular rate and rhythm, no murmurs  Examination of peripheral vascular system by observation and palpation is normal  EYES:  Ophthalmoscopic exam of optic discs and posterior segments is normal; no papilledema or hemorrhages  No exam data present  MUSCULOSKELETAL:  Gait,  strength, tone, movements noted in Neurologic exam below  NEUROLOGIC: MENTAL STATUS:  No flowsheet data found.  awake, alert, oriented to person, place and time  recent and remote memory intact  normal attention and concentration  language fluent, comprehension intact, naming intact  fund of knowledge appropriate  CRANIAL NERVE:   2nd - no papilledema on fundoscopic exam  2nd, 3rd, 4th, 6th - pupils equal and reactive to light, visual fields full to confrontation, extraocular muscles intact, no nystagmus  5th - facial sensation symmetric  7th - facial strength symmetric  8th - hearing intact  9th - palate elevates symmetrically, uvula midline  11th - shoulder shrug symmetric  12th - tongue protrusion midline  MOTOR:   normal bulk and tone, full strength in the BUE, BLE  SENSORY:   normal and symmetric to light touch, temperature, vibration; DECR SENSATION IN ANTERO-LATERAL THIGHS  COORDINATION:   finger-nose-finger, fine finger movements normal  REFLEXES:   deep tendon reflexes TRACE and symmetric  GAIT/STATION:   narrow based gait     DIAGNOSTIC DATA (LABS, IMAGING, TESTING) - I reviewed patient records, labs, notes, testing and imaging myself where available.  Lab Results  Component Value Date   WBC 11.6 (H) 04/14/2019   HGB 13.2 04/14/2019   HCT 41.8 04/14/2019   MCV 90.9 04/14/2019   PLT 258 04/14/2019      Component Value Date/Time   NA 140 04/14/2019 1336   K 3.4 (L) 04/14/2019 1336   CL 100 04/14/2019 1336   CO2 32 04/14/2019 1336   GLUCOSE 95 04/14/2019 1336   BUN 19 04/14/2019 1336   CREATININE 0.79 04/14/2019 1336   CALCIUM 9.5 04/14/2019 1336   PROT 7.9 04/14/2019 1336   ALBUMIN 4.0 04/14/2019 1336   AST 16 04/14/2019 1336   ALT 15 04/14/2019 1336   ALKPHOS 55 04/14/2019 1336   BILITOT 0.3 04/14/2019 1336   GFRNONAA >60 04/14/2019 1336   GFRAA >60 04/14/2019  1336   Lab Results  Component Value Date   CHOL 258 (A)  09/09/2017   HDL 106 (A) 09/09/2017   LDLCALC 128 09/09/2017   TRIG 118 09/09/2017   Lab Results  Component Value Date   HGBA1C 6.3 (H) 01/06/2019   No results found for: VITAMINB12 No results found for: TSH   08/27/17  MRI lumbar spine [I reviewed images myself and agree with interpretation. -VRP]  - Central protrusion with posterior element hypertrophy at L5-S1. BILATERAL S1 and RIGHT L5 neural impingement are likely. Progression from 2006.    ASSESSMENT AND PLAN  59 y.o. year old female here with:  Dx:  1. Meralgia paresthetica, bilateral lower limbs     PLAN:  MERALGIA PARESTHETICA, BILATERAL - continue gabapentin, tramadol - consider nerve block per Dr. Murray Hodgkins - consider PT, water therapy, gradual exercise regimen; gradual weight loss; avoid tight fitting clothing  Return for pending if symptoms worsen or fail to improve, return to pain mgmt.    Suanne Marker, MD 08/11/2019, 8:55 AM Certified in Neurology, Neurophysiology and Neuroimaging  [facility] [address] [place], [place] [address] [phone]

## 2019-08-11 NOTE — Patient Instructions (Addendum)
  MERALGIA PARESTHETICA, BILATERAL - continue gabapentin, tramadol - consider nerve block per Dr. Murray Hodgkins - consider PT, water therapy, gradual exercise regimen

## 2019-08-26 DIAGNOSIS — E1140 Type 2 diabetes mellitus with diabetic neuropathy, unspecified: Secondary | ICD-10-CM | POA: Diagnosis not present

## 2019-08-26 DIAGNOSIS — E782 Mixed hyperlipidemia: Secondary | ICD-10-CM | POA: Diagnosis not present

## 2019-09-01 DIAGNOSIS — F411 Generalized anxiety disorder: Secondary | ICD-10-CM | POA: Diagnosis not present

## 2019-09-01 DIAGNOSIS — F4312 Post-traumatic stress disorder, chronic: Secondary | ICD-10-CM | POA: Diagnosis not present

## 2019-09-09 DIAGNOSIS — F411 Generalized anxiety disorder: Secondary | ICD-10-CM | POA: Diagnosis not present

## 2019-09-09 DIAGNOSIS — F4312 Post-traumatic stress disorder, chronic: Secondary | ICD-10-CM | POA: Diagnosis not present

## 2019-09-14 DIAGNOSIS — M5417 Radiculopathy, lumbosacral region: Secondary | ICD-10-CM | POA: Diagnosis not present

## 2019-09-14 DIAGNOSIS — M461 Sacroiliitis, not elsewhere classified: Secondary | ICD-10-CM | POA: Diagnosis not present

## 2019-09-14 DIAGNOSIS — M7918 Myalgia, other site: Secondary | ICD-10-CM | POA: Diagnosis not present

## 2019-09-14 DIAGNOSIS — M5126 Other intervertebral disc displacement, lumbar region: Secondary | ICD-10-CM | POA: Diagnosis not present

## 2019-09-20 DIAGNOSIS — E1169 Type 2 diabetes mellitus with other specified complication: Secondary | ICD-10-CM | POA: Diagnosis not present

## 2019-09-20 DIAGNOSIS — I152 Hypertension secondary to endocrine disorders: Secondary | ICD-10-CM | POA: Diagnosis not present

## 2019-09-20 DIAGNOSIS — F331 Major depressive disorder, recurrent, moderate: Secondary | ICD-10-CM | POA: Diagnosis not present

## 2019-09-23 DIAGNOSIS — F4312 Post-traumatic stress disorder, chronic: Secondary | ICD-10-CM | POA: Diagnosis not present

## 2019-09-23 DIAGNOSIS — F411 Generalized anxiety disorder: Secondary | ICD-10-CM | POA: Diagnosis not present

## 2019-09-24 DIAGNOSIS — R21 Rash and other nonspecific skin eruption: Secondary | ICD-10-CM | POA: Diagnosis not present

## 2019-09-24 DIAGNOSIS — L259 Unspecified contact dermatitis, unspecified cause: Secondary | ICD-10-CM | POA: Diagnosis not present

## 2019-09-24 DIAGNOSIS — D485 Neoplasm of uncertain behavior of skin: Secondary | ICD-10-CM | POA: Diagnosis not present

## 2019-09-28 DIAGNOSIS — F4312 Post-traumatic stress disorder, chronic: Secondary | ICD-10-CM | POA: Diagnosis not present

## 2019-09-28 DIAGNOSIS — F411 Generalized anxiety disorder: Secondary | ICD-10-CM | POA: Diagnosis not present

## 2019-10-07 DIAGNOSIS — F4312 Post-traumatic stress disorder, chronic: Secondary | ICD-10-CM | POA: Diagnosis not present

## 2019-10-07 DIAGNOSIS — F411 Generalized anxiety disorder: Secondary | ICD-10-CM | POA: Diagnosis not present

## 2019-10-12 DIAGNOSIS — F411 Generalized anxiety disorder: Secondary | ICD-10-CM | POA: Diagnosis not present

## 2019-10-12 DIAGNOSIS — F4312 Post-traumatic stress disorder, chronic: Secondary | ICD-10-CM | POA: Diagnosis not present

## 2019-10-13 DIAGNOSIS — Z6841 Body Mass Index (BMI) 40.0 and over, adult: Secondary | ICD-10-CM | POA: Diagnosis not present

## 2019-10-13 DIAGNOSIS — M5126 Other intervertebral disc displacement, lumbar region: Secondary | ICD-10-CM | POA: Diagnosis not present

## 2019-10-13 DIAGNOSIS — M5417 Radiculopathy, lumbosacral region: Secondary | ICD-10-CM | POA: Diagnosis not present

## 2019-10-21 DIAGNOSIS — F4312 Post-traumatic stress disorder, chronic: Secondary | ICD-10-CM | POA: Diagnosis not present

## 2019-10-21 DIAGNOSIS — F411 Generalized anxiety disorder: Secondary | ICD-10-CM | POA: Diagnosis not present

## 2020-01-31 DIAGNOSIS — G5710 Meralgia paresthetica, unspecified lower limb: Secondary | ICD-10-CM | POA: Diagnosis not present

## 2020-01-31 DIAGNOSIS — Z6841 Body Mass Index (BMI) 40.0 and over, adult: Secondary | ICD-10-CM | POA: Diagnosis not present

## 2020-01-31 DIAGNOSIS — M542 Cervicalgia: Secondary | ICD-10-CM | POA: Diagnosis not present

## 2020-01-31 DIAGNOSIS — G8929 Other chronic pain: Secondary | ICD-10-CM | POA: Diagnosis not present

## 2020-01-31 DIAGNOSIS — M7918 Myalgia, other site: Secondary | ICD-10-CM | POA: Diagnosis not present

## 2020-01-31 DIAGNOSIS — M7062 Trochanteric bursitis, left hip: Secondary | ICD-10-CM | POA: Diagnosis not present

## 2020-03-02 DIAGNOSIS — M4692 Unspecified inflammatory spondylopathy, cervical region: Secondary | ICD-10-CM | POA: Insufficient documentation

## 2020-03-02 DIAGNOSIS — Z6841 Body Mass Index (BMI) 40.0 and over, adult: Secondary | ICD-10-CM | POA: Diagnosis not present

## 2020-03-02 DIAGNOSIS — M542 Cervicalgia: Secondary | ICD-10-CM | POA: Diagnosis not present

## 2020-03-02 DIAGNOSIS — M7918 Myalgia, other site: Secondary | ICD-10-CM | POA: Diagnosis not present

## 2020-03-14 ENCOUNTER — Other Ambulatory Visit: Payer: Self-pay | Admitting: Family Medicine

## 2020-03-14 DIAGNOSIS — Z1231 Encounter for screening mammogram for malignant neoplasm of breast: Secondary | ICD-10-CM

## 2020-03-16 DIAGNOSIS — H04129 Dry eye syndrome of unspecified lacrimal gland: Secondary | ICD-10-CM | POA: Diagnosis not present

## 2020-03-16 DIAGNOSIS — L570 Actinic keratosis: Secondary | ICD-10-CM | POA: Diagnosis not present

## 2020-03-16 DIAGNOSIS — Z6841 Body Mass Index (BMI) 40.0 and over, adult: Secondary | ICD-10-CM | POA: Diagnosis not present

## 2020-03-16 DIAGNOSIS — R195 Other fecal abnormalities: Secondary | ICD-10-CM | POA: Diagnosis not present

## 2020-03-29 DIAGNOSIS — Z1211 Encounter for screening for malignant neoplasm of colon: Secondary | ICD-10-CM | POA: Diagnosis not present

## 2020-03-29 DIAGNOSIS — Z6841 Body Mass Index (BMI) 40.0 and over, adult: Secondary | ICD-10-CM | POA: Diagnosis not present

## 2020-03-29 DIAGNOSIS — R69 Illness, unspecified: Secondary | ICD-10-CM | POA: Diagnosis not present

## 2020-03-29 DIAGNOSIS — E7800 Pure hypercholesterolemia, unspecified: Secondary | ICD-10-CM | POA: Diagnosis not present

## 2020-03-29 DIAGNOSIS — E119 Type 2 diabetes mellitus without complications: Secondary | ICD-10-CM | POA: Diagnosis not present

## 2020-03-29 DIAGNOSIS — Z0000 Encounter for general adult medical examination without abnormal findings: Secondary | ICD-10-CM | POA: Diagnosis not present

## 2020-03-29 DIAGNOSIS — J309 Allergic rhinitis, unspecified: Secondary | ICD-10-CM | POA: Diagnosis not present

## 2020-03-29 DIAGNOSIS — I10 Essential (primary) hypertension: Secondary | ICD-10-CM | POA: Diagnosis not present

## 2020-04-04 DIAGNOSIS — G5710 Meralgia paresthetica, unspecified lower limb: Secondary | ICD-10-CM | POA: Diagnosis not present

## 2020-04-04 DIAGNOSIS — R69 Illness, unspecified: Secondary | ICD-10-CM | POA: Diagnosis not present

## 2020-04-04 DIAGNOSIS — M7918 Myalgia, other site: Secondary | ICD-10-CM | POA: Diagnosis not present

## 2020-04-04 DIAGNOSIS — M5417 Radiculopathy, lumbosacral region: Secondary | ICD-10-CM | POA: Diagnosis not present

## 2020-04-04 DIAGNOSIS — M461 Sacroiliitis, not elsewhere classified: Secondary | ICD-10-CM | POA: Diagnosis not present

## 2020-04-04 DIAGNOSIS — M4692 Unspecified inflammatory spondylopathy, cervical region: Secondary | ICD-10-CM | POA: Diagnosis not present

## 2020-04-04 DIAGNOSIS — M779 Enthesopathy, unspecified: Secondary | ICD-10-CM | POA: Diagnosis not present

## 2020-04-04 DIAGNOSIS — M542 Cervicalgia: Secondary | ICD-10-CM | POA: Diagnosis not present

## 2020-04-04 DIAGNOSIS — M7062 Trochanteric bursitis, left hip: Secondary | ICD-10-CM | POA: Diagnosis not present

## 2020-04-04 DIAGNOSIS — Z79899 Other long term (current) drug therapy: Secondary | ICD-10-CM | POA: Diagnosis not present

## 2020-04-19 ENCOUNTER — Ambulatory Visit
Admission: RE | Admit: 2020-04-19 | Discharge: 2020-04-19 | Disposition: A | Discharge status: Home / Self Care | Payer: 59 | Source: Ambulatory Visit

## 2020-04-19 ENCOUNTER — Other Ambulatory Visit: Payer: Self-pay

## 2020-04-19 DIAGNOSIS — Z1231 Encounter for screening mammogram for malignant neoplasm of breast: Secondary | ICD-10-CM

## 2020-05-11 DIAGNOSIS — R7303 Prediabetes: Secondary | ICD-10-CM | POA: Diagnosis not present

## 2020-06-10 DIAGNOSIS — R7303 Prediabetes: Secondary | ICD-10-CM | POA: Diagnosis not present

## 2020-07-11 DIAGNOSIS — R7303 Prediabetes: Secondary | ICD-10-CM | POA: Diagnosis not present

## 2020-07-13 DIAGNOSIS — K429 Umbilical hernia without obstruction or gangrene: Secondary | ICD-10-CM | POA: Diagnosis not present

## 2020-07-13 DIAGNOSIS — Z6841 Body Mass Index (BMI) 40.0 and over, adult: Secondary | ICD-10-CM | POA: Diagnosis not present

## 2020-07-13 DIAGNOSIS — K5900 Constipation, unspecified: Secondary | ICD-10-CM | POA: Diagnosis not present

## 2020-07-28 DIAGNOSIS — M25572 Pain in left ankle and joints of left foot: Secondary | ICD-10-CM | POA: Diagnosis not present

## 2020-07-31 DIAGNOSIS — M7750 Other enthesopathy of unspecified foot: Secondary | ICD-10-CM | POA: Insufficient documentation

## 2020-08-10 DIAGNOSIS — R7303 Prediabetes: Secondary | ICD-10-CM | POA: Diagnosis not present

## 2021-04-12 ENCOUNTER — Ambulatory Visit: Payer: Managed Care, Other (non HMO) | Admitting: Registered Nurse

## 2021-04-12 ENCOUNTER — Encounter: Payer: Self-pay | Admitting: Registered Nurse

## 2021-04-12 ENCOUNTER — Other Ambulatory Visit: Payer: Self-pay

## 2021-04-12 VITALS — BP 108/74 | HR 77 | Temp 98.0°F | Resp 18 | Ht 61.0 in | Wt 231.4 lb

## 2021-04-12 DIAGNOSIS — R21 Rash and other nonspecific skin eruption: Secondary | ICD-10-CM

## 2021-04-12 DIAGNOSIS — G43109 Migraine with aura, not intractable, without status migrainosus: Secondary | ICD-10-CM | POA: Diagnosis not present

## 2021-04-12 DIAGNOSIS — E1169 Type 2 diabetes mellitus with other specified complication: Secondary | ICD-10-CM

## 2021-04-12 DIAGNOSIS — M797 Fibromyalgia: Secondary | ICD-10-CM

## 2021-04-12 DIAGNOSIS — I10 Essential (primary) hypertension: Secondary | ICD-10-CM | POA: Diagnosis not present

## 2021-04-12 DIAGNOSIS — Z1159 Encounter for screening for other viral diseases: Secondary | ICD-10-CM

## 2021-04-12 LAB — COMPREHENSIVE METABOLIC PANEL
ALT: 14 U/L (ref 0–35)
AST: 23 U/L (ref 0–37)
Albumin: 4.4 g/dL (ref 3.5–5.2)
Alkaline Phosphatase: 66 U/L (ref 39–117)
BUN: 16 mg/dL (ref 6–23)
CO2: 33 mEq/L — ABNORMAL HIGH (ref 19–32)
Calcium: 9.5 mg/dL (ref 8.4–10.5)
Chloride: 98 mEq/L (ref 96–112)
Creatinine, Ser: 0.81 mg/dL (ref 0.40–1.20)
GFR: 79.56 mL/min (ref >60.00)
Glucose, Bld: 82 mg/dL (ref 70–99)
Potassium: 3.9 mEq/L (ref 3.5–5.1)
Sodium: 141 mEq/L (ref 135–145)
Total Bilirubin: 0.4 mg/dL (ref 0.2–1.2)
Total Protein: 7.7 g/dL (ref 6.0–8.3)

## 2021-04-12 LAB — CBC WITH DIFFERENTIAL/PLATELET
Basophils Absolute: 0.1 10*3/uL (ref 0.0–0.1)
Basophils Relative: 2 % (ref 0.0–3.0)
Eosinophils Absolute: 0.1 10*3/uL (ref 0.0–0.7)
Eosinophils Relative: 2 % (ref 0.0–5.0)
HCT: 44.1 % (ref 36.0–46.0)
Hemoglobin: 14.2 g/dL (ref 12.0–15.0)
Lymphocytes Relative: 29 % (ref 12.0–46.0)
Lymphs Abs: 1.8 10*3/uL (ref 0.7–4.0)
MCHC: 32.2 g/dL (ref 30.0–36.0)
MCV: 90.1 fl (ref 78.0–100.0)
Monocytes Absolute: 0.5 10*3/uL (ref 0.1–1.0)
Monocytes Relative: 8 % (ref 3.0–12.0)
Neutro Abs: 3.7 10*3/uL (ref 1.4–7.7)
Neutrophils Relative %: 59 % (ref 43.0–77.0)
Platelets: 244 10*3/uL (ref 150.0–400.0)
RBC: 4.89 Mil/uL (ref 3.87–5.11)
RDW: 14.4 % (ref 11.5–15.5)
WBC: 6.2 10*3/uL (ref 4.0–10.5)

## 2021-04-12 LAB — HEMOGLOBIN A1C: Hgb A1c MFr Bld: 6.7 % — ABNORMAL HIGH (ref 4.6–6.5)

## 2021-04-12 LAB — LIPID PANEL
Cholesterol: 235 mg/dL — ABNORMAL HIGH (ref 0–200)
HDL: 111.2 mg/dL (ref >39.00)
LDL Cholesterol: 106 mg/dL — ABNORMAL HIGH (ref 0–99)
NonHDL: 123.45
Total CHOL/HDL Ratio: 2
Triglycerides: 86 mg/dL (ref 0.0–149.0)
VLDL: 17.2 mg/dL (ref 0.0–40.0)

## 2021-04-12 LAB — TSH: TSH: 2.05 u[IU]/mL (ref 0.35–5.50)

## 2021-04-12 LAB — MICROALBUMIN / CREATININE URINE RATIO
Creatinine,U: 64.7 mg/dL
Microalb Creat Ratio: 1.1 mg/g (ref 0.0–30.0)
Microalb, Ur: <0.7 mg/dL (ref 0.0–1.9)

## 2021-04-12 MED ORDER — TIRZEPATIDE 5 MG/0.5ML ~~LOC~~ SOAJ: 5.0000 mg | SUBCUTANEOUS | 1 refills | Status: DC

## 2021-04-12 MED ORDER — CLOTRIMAZOLE-BETAMETHASONE 1-0.05 % EX CREA: 1.0000 "application " | TOPICAL_CREAM | Freq: Every day | CUTANEOUS | 0 refills | Status: DC

## 2021-04-12 MED ORDER — SUMATRIPTAN SUCCINATE 100 MG PO TABS: 100.0000 mg | ORAL_TABLET | ORAL | 5 refills | Status: DC | PRN

## 2021-04-12 MED ORDER — DULOXETINE HCL 60 MG PO CPEP: 60.0000 mg | ORAL_CAPSULE | Freq: Every day | ORAL | 1 refills | Status: DC

## 2021-04-12 MED ORDER — ATORVASTATIN CALCIUM 20 MG PO TABS: 20.0000 mg | ORAL_TABLET | Freq: Every day | ORAL | 1 refills | Status: DC

## 2021-04-12 MED ORDER — HYDROCHLOROTHIAZIDE 25 MG PO TABS: 25.0000 mg | ORAL_TABLET | Freq: Every morning | ORAL | 1 refills | Status: DC

## 2021-04-12 MED ORDER — AMLODIPINE BESYLATE 5 MG PO TABS: 5.0000 mg | ORAL_TABLET | Freq: Every day | ORAL | 1 refills | Status: DC

## 2021-04-12 MED ORDER — TRAMADOL HCL 50 MG PO TABS: 50.0000 mg | ORAL_TABLET | ORAL | 2 refills | Status: DC

## 2021-04-12 MED ORDER — GABAPENTIN 600 MG PO TABS: 600.0000 mg | ORAL_TABLET | ORAL | 1 refills | Status: DC

## 2021-04-12 NOTE — Patient Instructions (Addendum)
Ms Kelsey Simmons -  ? ? ?Great to see you ? ?Let's check labs ? ?See you in 6 mo for a physical and repeat labs ? ?Refills sent until then ? ?Thanks, ? ?Rich  ? ?If you have lab work done today you will be contacted with your lab results within the next 2 weeks.  If you have not heard from [place] then please contact us. The fastest way to get your results is to register for My Chart. ? ? ?IF you received an x-ray today, you will receive an invoice from [facility]. Please contact [facility] at [phone] with questions or concerns regarding your invoice.  ? ?IF you received labwork today, you will receive an invoice from [facility]. Please contact [facility] at [phone] with questions or concerns regarding your invoice.  ? ?Our billing staff will not be able to assist you with questions regarding bills from these companies. ? ?You will be contacted with the lab results as soon as they are available. The fastest way to get your results is to activate your My Chart account. Instructions are located on the last page of this paperwork. If you have not heard from [place] regarding the results in 2 weeks, please contact this office. ?  ? ? ?

## 2021-04-12 NOTE — Progress Notes (Signed)
? ?New Patient Office Visit ? ?Subjective:  ?Patient ID: Kelsey Simmons, female    DOB: Jun 09, 1960  Age: 61 y.o. MRN: [id number] ? ?CC:  ?Chief Complaint  ?Patient presents with  ? New Patient (Initial Visit)  ?  Patient states she is her to establish care. And also a medication refill ?  ? ? ?HPI ?Kelsey Simmons presents to establish care ? ?No acute concerns ? ?Histories reviewed and updated with patient.  ? ?Needs medication refills: ?Chronic pain  ?Gabapentin 600mg  tid, cymbalta 60mg  po qd, tramadol 50mg  po tid prn.  ? ?T2dm ?Ozempic 2mg  subq weekly ?Good effect, no AE. ?Has been out - had used someone else's leftover trulicity in the interim ?Has heard about mounjaro, heard good things, would like to try ?Last A1c showed adequate control at 6.3 on 03/29/20. ? ?Htn ?Amlodipine 5mg  po qd, hctz 25mg  po qd ?Good effect, no ae  ?Denies chest pain, claudication, dependent edema, palpitations, headaches, visual changes ? ?Migraine ?Sumatriptan 100mg  po 1-2 times daily per instructions prn ?Good effect, no AE. ?Migraines stable.  ? ?Rash ?Axillae, lower back at belt line, groin/upper inner thighs.  ?Itches, flakes. No ulceration or drainage. ?Has been present on and off for a few weeks. OTC topicals not effective. ? ?Outpatient Encounter Medications as of 04/12/2021  ?Medication Sig  ? clotrimazole-betamethasone (LOTRISONE) cream Apply 1 application. topically daily.  ? diclofenac Sodium (VOLTAREN) 1 % GEL Apply topically 4 (four) times daily.  ? tirzepatide (MOUNJARO) 5 MG/0.5ML Pen Inject 5 mg into the skin once a week.  ? [DISCONTINUED] amLODipine (NORVASC) 5 MG tablet Take 5 mg by mouth daily.   ? [DISCONTINUED] atorvastatin (LIPITOR) 20 MG tablet Take 20 mg by mouth at bedtime.  ? [DISCONTINUED] DULoxetine (CYMBALTA) 60 MG capsule Take 60 mg by mouth daily.  ? [DISCONTINUED] gabapentin (NEURONTIN) 600 MG tablet Take 600 mg by mouth See admin instructions. Take 600mg  twice a day. Take additional 600mg  once a day as  needed for pain.  ? [DISCONTINUED] hydrochlorothiazide (HYDRODIURIL) 25 MG tablet Take 25 mg by mouth every morning.  ? [DISCONTINUED] OZEMPIC, 0.25 OR 0.5 MG/DOSE, 2 MG/1.5ML SOPN Inject 0.5 mg into the skin every Wednesday.   ? [DISCONTINUED] SUMAtriptan (IMITREX) 100 MG tablet Take 100 mg by mouth every 2 (two) hours as needed for migraine or headache. May repeat in 2 hours if headache persists or recurs.  ? [DISCONTINUED] traMADol (ULTRAM) 50 MG tablet Take 50 mg by mouth See admin instructions. Take 50mg  twice a day. Take additional 50mg  once a day as needed for pain.  ? amLODipine (NORVASC) 5 MG tablet Take 1 tablet (5 mg total) by mouth daily.  ? atorvastatin (LIPITOR) 20 MG tablet Take 1 tablet (20 mg total) by mouth at bedtime.  ? DULoxetine (CYMBALTA) 60 MG capsule Take 1 capsule (60 mg total) by mouth daily.  ? hydrochlorothiazide (HYDRODIURIL) 25 MG tablet Take 1 tablet (25 mg total) by mouth every morning.  ? SUMAtriptan (IMITREX) 100 MG tablet Take 1 tablet (100 mg total) by mouth every 2 (two) hours as needed for migraine or headache. May repeat in 2 hours if headache persists or recurs.  ? traMADol (ULTRAM) 50 MG tablet Take 1 tablet (50 mg total) by mouth See admin instructions. Take 50mg  twice a day. Take additional 50mg  once a day as needed for pain.  ? [DISCONTINUED] acetaminophen (TYLENOL) 500 MG tablet Take 1,000 mg by mouth every 6 (six) hours as needed.  ? [DISCONTINUED] butalbital-aspirin-caffeine (  FIORINAL) 50-325-40 MG capsule Take 1 capsule by mouth every 4 (four) hours as needed for headache.   ? [DISCONTINUED] DULoxetine (CYMBALTA) 30 MG capsule Take by mouth. (Patient not taking: Reported on 04/12/2021)  ? [DISCONTINUED] gabapentin (NEURONTIN) 600 MG tablet Take 1 tablet (600 mg total) by mouth See admin instructions. Take 600mg  twice a day. Take additional 600mg  once a day as needed for pain.  ? [DISCONTINUED] Vitamin D, Ergocalciferol, (DRISDOL) 1.25 MG (50000 UNIT) CAPS capsule Take  50,000 Units by mouth See admin instructions. Takes on Mondays and Thursdays. (Patient not taking: Reported on 08/11/2019)  ? ?No facility-administered encounter medications on file as of 04/12/2021.  ? ? ?Past Medical History:  ?Diagnosis Date  ? Allergy   ? Anxiety   ? Arthritis   ? Bilateral sacroiliitis ([place])   ? Chronic back pain   ? Depression 2020  ? in therapy but not on medications  ? Diabetes mellitus without complication ([place])   ? type 2   ? Fibromyalgia   ? Headache(784.0)   ? HX MIGRAINES  ? History of kidney stones 2020  ? left parenchymal stone identified on CT 11/23/18   kidney cyst  ? Hypercholesterolemia   ? Hypertension   ? Lumbar spondylosis   ? Lumbosacral neuritis   ? Myofascial pain   ? Paresthesia of lower extremity   ? Sleep apnea   ? HAS NOT USED C PAP SINCE 2007 DUE TO WT LOSS   ? Trochanteric bursitis   ? left hip  ? ? ?Past Surgical History:  ?Procedure Laterality Date  ? ABDOMINAL HYSTERECTOMY  1997  ? BUNIONECTOMY  2012  ? CESAREAN SECTION    ? GASTRIC BYPASS  2006  ? JOINT REPLACEMENT    ? RTKA 7'15  ? ROBOTIC ASSISTED LAPAROSCOPIC LYSIS OF ADHESION N/A 04/19/2019  ? Procedure: XI ROBOTIC ASSISTED LAPAROSCOPIC LYSIS OF ADHESION;  Surgeon: Lafonda Mosses, MD;  Location: [facility];  Service: Gynecology;  Laterality: N/A;  ? ROBOTIC ASSISTED SALPINGO OOPHERECTOMY Left 04/19/2019  ? Procedure: XI ROBOTIC ASSISTED SALPINGO OOPHORECTOMY;  Surgeon: Lafonda Mosses, MD;  [facility]: [facility];  Service: Gynecology;  Laterality: Left;  ? TOTAL KNEE ARTHROPLASTY Right 08/18/2013  ? Procedure: RIGHT TOTAL KNEE ARTHROPLASTY;  Surgeon: Tobi Bastos, MD;  Location: [facility];  Service: Orthopedics;  Laterality: Right;  ? TOTAL KNEE ARTHROPLASTY Left 04/13/2014  ? Procedure: LEFT TOTAL KNEE ARTHROPLASTY;  Surgeon: Latanya Maudlin, MD;  [facility];  Service: Orthopedics;  Laterality: Left;  ? VENTRAL HERNIA REPAIR N/A 01/11/2019  ? Procedure: LAPAROSCOPIC VENTRAL HERNIA REPAIR WITH MESH;  Surgeon:  Jesusita Oka, MD;  Location: [facility];  Service: General;  Laterality: N/A;  ? ? ?Family History  ?Problem Relation Age of Onset  ? Diabetes Mellitus II Mother   ? Lupus Mother   ? Arthritis Mother   ? Diabetes Mother   ? Hypertension Mother   ? Miscarriages / [place] Mother   ? Lung cancer Father   ?     smoker/worked in factory  ? Alcohol abuse Father   ? Cancer Father   ?     smoker, worked in [place]  ? Early death Father   ? Hypertension Father   ? Diabetes Mellitus II Sister   ? Arthritis Sister   ? Diabetes Sister   ? Hypertension Sister   ? Obesity Sister   ? Diabetes Mellitus II Brother   ? Hypercholesterolemia Brother   ? Diabetes Brother   ?  Cancer Maternal Grandmother   ?     Either uterine or endometrial  ? Colon cancer Neg Hx   ? Breast cancer Neg Hx   ? ? ?Social History  ? ?Socioeconomic History  ? Marital status: Married  ?  Spouse name: Lucious  ? Number of children: 2  ? Years of education: Not on file  ? Highest education level: High school graduate  ?Occupational History  ? Not on file  ?Tobacco Use  ? Smoking status: Never  ? Smokeless tobacco: Never  ?Vaping Use  ? Vaping Use: Never used  ?Substance and Sexual Activity  ? Alcohol use: Not Currently  ?  Comment: quit 1994  ? Drug use: Not Currently  ?  Types: "Crack" cocaine  ?  Comment: quit 1994  ? Sexual activity: Not Currently  ?  Birth control/protection: Post-menopausal  ?Other Topics Concern  ? Not on file  ?Social History Narrative  ? Lives with husband  ? Caffeine- 11 oz c daily  ? ?Social Determinants of Health  ? ?Financial Resource Strain: Not on file  ?Food Insecurity: Not on file  ?Transportation Needs: Not on file  ?Physical Activity: Not on file  ?Stress: Not on file  ?Social Connections: Not on file  ?Intimate Partner Violence: Not on file  ? ? ?ROS ?Review of Systems  ?Constitutional: Negative.   ?HENT: Negative.    ?Eyes: Negative.   ?Respiratory: Negative.    ?Cardiovascular: Negative.   ?Gastrointestinal: Negative.    ?Genitourinary: Negative.   ?Musculoskeletal: Negative.   ?Skin: Negative.   ?Neurological: Negative.   ?Psychiatric/Behavioral: Negative.    ? ?Objective:  ? ?Today's Vitals: BP 108/74   Pulse 77   Temp

## 2021-04-13 ENCOUNTER — Other Ambulatory Visit: Payer: Self-pay | Admitting: Registered Nurse

## 2021-04-13 ENCOUNTER — Encounter: Payer: Self-pay | Admitting: Registered Nurse

## 2021-04-13 DIAGNOSIS — M797 Fibromyalgia: Secondary | ICD-10-CM

## 2021-04-13 DIAGNOSIS — J301 Allergic rhinitis due to pollen: Secondary | ICD-10-CM

## 2021-04-13 DIAGNOSIS — R21 Rash and other nonspecific skin eruption: Secondary | ICD-10-CM | POA: Insufficient documentation

## 2021-04-13 MED ORDER — FLUTICASONE PROPIONATE 50 MCG/ACT NA SUSP: 2.0000 | Freq: Every day | NASAL | 6 refills | Status: DC

## 2021-04-13 MED ORDER — GABAPENTIN 600 MG PO TABS: 600.0000 mg | ORAL_TABLET | Freq: Three times a day (TID) | ORAL | 1 refills | Status: DC

## 2021-04-13 NOTE — Assessment & Plan Note (Signed)
Stable, well controlled with current regimen. Continue. Refills x 6 mo ?

## 2021-04-13 NOTE — Assessment & Plan Note (Signed)
Stable on sumatriptan. No SNOOP symptoms. Refill x 6 mo ?

## 2021-04-13 NOTE — Telephone Encounter (Signed)
Please advise patient message about not wanting to start insulin for A1c. ?

## 2021-04-13 NOTE — Assessment & Plan Note (Signed)
Appears fungal. lotrisone sent. Safe use discussed, pt voices understanding. ?Return if worsening or failing to improve.  ?

## 2021-04-13 NOTE — Assessment & Plan Note (Signed)
Stable. Continue current regimen. meds refilled x 6 mo ?

## 2021-04-13 NOTE — Assessment & Plan Note (Signed)
Stable per last labs - overdue for A1c and urine micro. Will check today. Stop ozempic, start mounjaro 5mg  subq weekly. Recheck in 6 mo if A1c within goal range. ?

## 2021-05-14 ENCOUNTER — Telehealth: Payer: Self-pay

## 2021-05-14 NOTE — Telephone Encounter (Signed)
This has been updated.

## 2021-05-14 NOTE — Telephone Encounter (Signed)
Patient requests that [facility] be the primary pharmacy. Phone number is [phone]. ?

## 2021-05-16 ENCOUNTER — Encounter: Payer: Self-pay | Admitting: Registered Nurse

## 2021-05-16 DIAGNOSIS — E1169 Type 2 diabetes mellitus with other specified complication: Secondary | ICD-10-CM

## 2021-05-16 DIAGNOSIS — J301 Allergic rhinitis due to pollen: Secondary | ICD-10-CM

## 2021-05-16 DIAGNOSIS — M797 Fibromyalgia: Secondary | ICD-10-CM

## 2021-05-16 DIAGNOSIS — I10 Essential (primary) hypertension: Secondary | ICD-10-CM

## 2021-05-18 MED ORDER — TRAMADOL HCL 50 MG PO TABS: 50.0000 mg | ORAL_TABLET | ORAL | 2 refills | Status: DC

## 2021-05-18 MED ORDER — HYDROCHLOROTHIAZIDE 25 MG PO TABS: 25.0000 mg | ORAL_TABLET | Freq: Every morning | ORAL | 1 refills | Status: DC

## 2021-05-18 MED ORDER — GABAPENTIN 600 MG PO TABS: 600.0000 mg | ORAL_TABLET | Freq: Three times a day (TID) | ORAL | 1 refills | Status: DC

## 2021-05-18 MED ORDER — DULOXETINE HCL 60 MG PO CPEP: 60.0000 mg | ORAL_CAPSULE | Freq: Every day | ORAL | 1 refills | Status: DC

## 2021-05-18 MED ORDER — FLUTICASONE PROPIONATE 50 MCG/ACT NA SUSP: 2.0000 | Freq: Every day | NASAL | 6 refills | Status: DC

## 2021-05-18 MED ORDER — ATORVASTATIN CALCIUM 20 MG PO TABS: 20.0000 mg | ORAL_TABLET | Freq: Every day | ORAL | 1 refills | Status: DC

## 2021-05-18 MED ORDER — AMLODIPINE BESYLATE 5 MG PO TABS: 5.0000 mg | ORAL_TABLET | Freq: Every day | ORAL | 1 refills | Status: DC

## 2021-05-18 NOTE — Telephone Encounter (Signed)
Pt has requested all her rx be sent to [facility] for handling. Have pended those below  ?

## 2021-05-29 ENCOUNTER — Ambulatory Visit (INDEPENDENT_AMBULATORY_CARE_PROVIDER_SITE_OTHER): Payer: Managed Care, Other (non HMO) | Admitting: Registered Nurse

## 2021-05-29 ENCOUNTER — Encounter: Payer: Managed Care, Other (non HMO) | Admitting: Registered Nurse

## 2021-05-29 ENCOUNTER — Other Ambulatory Visit: Payer: Self-pay

## 2021-05-29 ENCOUNTER — Encounter: Payer: Self-pay | Admitting: Registered Nurse

## 2021-05-29 VITALS — BP 107/74 | HR 100 | Temp 98.1°F | Resp 17 | Ht 61.0 in | Wt 222.2 lb

## 2021-05-29 DIAGNOSIS — Z0000 Encounter for general adult medical examination without abnormal findings: Secondary | ICD-10-CM | POA: Diagnosis not present

## 2021-05-29 DIAGNOSIS — E1169 Type 2 diabetes mellitus with other specified complication: Secondary | ICD-10-CM

## 2021-05-29 NOTE — Patient Instructions (Addendum)
Ms. Mandala -  ? ?Always great to see you! ? ?GREAT WORK on the weight loss. No notes. Keep doing what you're doing.  ? ?Call with any concerns ? ?Otherwise, see you in 6 mo ? ?Thanks, ? ?Rich  ? ? ? ?If you have lab work done today you will be contacted with your lab results within the next 2 weeks.  If you have not heard from [place] then please contact us. The fastest way to get your results is to register for My Chart. ? ? ?IF you received an x-ray today, you will receive an invoice from [facility]. Please contact [facility] at [phone] with questions or concerns regarding your invoice.  ? ?IF you received labwork today, you will receive an invoice from [place]. Please contact [facility] at [phone] with questions or concerns regarding your invoice.  ? ?Our billing staff will not be able to assist you with questions regarding bills from these companies. ? ?You will be contacted with the lab results as soon as they are available. The fastest way to get your results is to activate your My Chart account. Instructions are located on the last page of this paperwork. If you have not heard from [place] regarding the results in 2 weeks, please contact this office. ?  ? ? ?

## 2021-05-29 NOTE — Progress Notes (Signed)
? ?Complete physical exam ? ?Patient: Kelsey Simmons   DOB: 1960-07-05   61 y.o. Female  MRN: [id number] ?Visit Date: 05/29/2021 ? ?Subjective:  ?  ?Chief Complaint  ?Patient presents with  ? Annual Exam  ?  Patient states she is here for a CPE.  ? ? ?Kelsey Simmons is a 61 y.o. female who presents today for a complete physical exam. She reports consuming a general diet.  She generally feels well. She reports sleeping well. She does not have additional problems to discuss today.  ? ?Vision:Within the last year ?Dental:Within Last 6 months ?STD Screen:No ? ?Most recent fall risk assessment: ? ?  05/29/2021  ?  9:39 AM  ?Fall Risk   ?Falls in the past year? 0  ?Number falls in past yr: 0  ?Injury with Fall? 0  ?Risk for fall due to : No Fall Risks  ?Follow up Falls evaluation completed  ? ?  ?Most recent depression screenings: ? ?  05/29/2021  ?  9:40 AM 04/12/2021  ?  9:58 AM  ?PHQ 2/9 Scores  ?PHQ - 2 Score 2 2  ?PHQ- 9 Score 4 7  ? ? ? ?Patient Active Problem List  ? Diagnosis Date Noted  ? Rash and nonspecific skin eruption 04/13/2021  ? Ovarian cyst, left 04/09/2019  ? Exposure to severe acute respiratory syndrome coronavirus 2 (SARS-CoV-2) 02/13/2019  ? Abdominal pain 02/09/2019  ? Acquired spondylolisthesis 02/09/2019  ? Allergic rhinitis 02/09/2019  ? Constipation 02/09/2019  ? Fibrocystic breast changes 02/09/2019  ? Fibromyalgia 02/09/2019  ? Gastroesophageal reflux disease without esophagitis 02/09/2019  ? Iron deficiency anemia 02/09/2019  ? Migraine with aura 02/09/2019  ? Pure hypercholesterolemia 02/09/2019  ? Diabetes mellitus without complication (HCC) 09/22/2018  ? Hypertension 04/12/2015  ? History of total knee arthroplasty 04/13/2014  ? Osteoarthritis of right knee 08/18/2013  ? Hx of total knee arthroplasty 08/18/2013  ? ?Past Medical History:  ?Diagnosis Date  ? Allergy   ? Anxiety   ? Arthritis   ? Bilateral sacroiliitis (HCC)   ? Chronic back pain   ? Depression 2020  ? in therapy but not on  medications  ? Diabetes mellitus without complication (HCC)   ? type 2   ? Fibromyalgia   ? Headache(784.0)   ? HX MIGRAINES  ? History of kidney stones 2020  ? left parenchymal stone identified on CT 11/23/18   kidney cyst  ? Hypercholesterolemia   ? Hypertension   ? Lumbar spondylosis   ? Lumbosacral neuritis   ? Myofascial pain   ? Paresthesia of lower extremity   ? Sleep apnea   ? HAS NOT USED C PAP SINCE 2007 DUE TO WT LOSS   ? Trochanteric bursitis   ? left hip  ? ?Past Surgical History:  ?Procedure Laterality Date  ? ABDOMINAL HYSTERECTOMY  1997  ? BUNIONECTOMY  2012  ? CESAREAN SECTION    ? GASTRIC BYPASS  2006  ? JOINT REPLACEMENT    ? RTKA 7'15  ? ROBOTIC ASSISTED LAPAROSCOPIC LYSIS OF ADHESION N/A 04/19/2019  ? Procedure: XI ROBOTIC ASSISTED LAPAROSCOPIC LYSIS OF ADHESION;  Surgeon: Carver Fila, MD;  Location: [facility];  Service: Gynecology;  Laterality: N/A;  ? ROBOTIC ASSISTED SALPINGO OOPHERECTOMY Left 04/19/2019  ? Procedure: XI ROBOTIC ASSISTED SALPINGO OOPHORECTOMY;  Surgeon: Carver Fila, MD;  Location: [facility];  Service: Gynecology;  Laterality: Left;  ? TOTAL KNEE ARTHROPLASTY Right 08/18/2013  ? Procedure: RIGHT TOTAL KNEE ARTHROPLASTY;  Surgeon: Jacki Conesonald A Gioffre, MD;  Location: [facility];  Service: Orthopedics;  Laterality: Right;  ? TOTAL KNEE ARTHROPLASTY Left 04/13/2014  ? Procedure: LEFT TOTAL KNEE ARTHROPLASTY;  Surgeon: Ranee Gosselinonald Gioffre, MD;  Location: [facility];  Service: Orthopedics;  Laterality: Left;  ? VENTRAL HERNIA REPAIR N/A 01/11/2019  ? Procedure: LAPAROSCOPIC VENTRAL HERNIA REPAIR WITH MESH;  Surgeon: Diamantina MonksLovick, Ayesha N, MD;  Location: [facility];  Service: General;  Laterality: N/A;  ? ?Social History  ? ?Tobacco Use  ? Smoking status: Never  ? Smokeless tobacco: Never  ?Vaping Use  ? Vaping Use: Never used  ?Substance Use Topics  ? Alcohol use: Not Currently  ?  Comment: quit 1994  ? Drug use: Not Currently  ?  Types: "Crack" cocaine  ?  Comment: quit 1994  ? ?Social History   ? ?Socioeconomic History  ? Marital status: Married  ?  Spouse name: Lucious  ? Number of children: 2  ? Years of education: Not on file  ? Highest education level: High school graduate  ?Occupational History  ? Not on file  ?Tobacco Use  ? Smoking status: Never  ? Smokeless tobacco: Never  ?Vaping Use  ? Vaping Use: Never used  ?Substance and Sexual Activity  ? Alcohol use: Not Currently  ?  Comment: quit 1994  ? Drug use: Not Currently  ?  Types: "Crack" cocaine  ?  Comment: quit 1994  ? Sexual activity: Not Currently  ?  Birth control/protection: Post-menopausal  ?Other Topics Concern  ? Not on file  ?Social History Narrative  ? Lives with husband  ? Caffeine- 11 oz c daily  ? ?Social Determinants of Health  ? ?Financial Resource Strain: Not on file  ?Food Insecurity: Not on file  ?Transportation Needs: Not on file  ?Physical Activity: Not on file  ?Stress: Not on file  ?Social Connections: Not on file  ?Intimate Partner Violence: Not on file  ? ?Family Status  ?Relation Name Status  ? Mother Jason NestBarbara Alive  ? Father Shon HaleLeon Deceased  ? Sister Gildardo PoundsMary Alive  ? Brother Ethelene Brownsnthony Alive  ? [employer] Pattricia Bossnnie Deceased  ?     uterine or ovarian  ? Neg Hx  (Not Specified)  ? ?Family History  ?Problem Relation Age of Onset  ? Diabetes Mellitus II Mother   ? Lupus Mother   ? Arthritis Mother   ? Diabetes Mother   ? Hypertension Mother   ? Miscarriages / [place] Mother   ? Lung cancer Father   ?     smoker/worked in factory  ? Alcohol abuse Father   ? Cancer Father   ?     smoker, worked in factory  ? Early death Father   ? Hypertension Father   ? Diabetes Mellitus II Sister   ? Arthritis Sister   ? Diabetes Sister   ? Hypertension Sister   ? Obesity Sister   ? Diabetes Mellitus II Brother   ? Hypercholesterolemia Brother   ? Diabetes Brother   ? Cancer Maternal Grandmother   ?     Either uterine or endometrial  ? Colon cancer Neg Hx   ? Breast cancer Neg Hx   ? ?Allergies  ?Allergen Reactions  ? Topiramate   ?  Other reaction(s):  double vision  ? Chlorhexidine Gluconate Rash  ? Tape Rash  ?  DERMABOND  ? ?  ?Patient Care Team: ?Janeece AgeeMorrow, Timo Hartwig, NP as PCP - General (Adult Health Nurse Practitioner) ?Glendale ChardPatel, Donika K, DO as Consulting  Physician (Neurology) ?Callie Fielding, MD as Referring Physician (Physical Medicine and Rehabilitation)  ? ?Medications: ?Outpatient Medications Prior to Visit  ?Medication Sig  ? amLODipine (NORVASC) 5 MG tablet Take 1 tablet (5 mg total) by mouth daily.  ? atorvastatin (LIPITOR) 20 MG tablet Take 1 tablet (20 mg total) by mouth at bedtime.  ? clotrimazole-betamethasone (LOTRISONE) cream Apply 1 application. topically daily.  ? diclofenac Sodium (VOLTAREN) 1 % GEL Apply topically 4 (four) times daily.  ? DULoxetine (CYMBALTA) 60 MG capsule Take 1 capsule (60 mg total) by mouth daily.  ? fluticasone (FLONASE) 50 MCG/ACT nasal spray Place 2 sprays into both nostrils daily.  ? gabapentin (NEURONTIN) 600 MG tablet Take 1 tablet (600 mg total) by mouth 3 (three) times daily. Take 600mg  twice a day. Take additional 600mg  once a day as needed for pain.  ? hydrochlorothiazide (HYDRODIURIL) 25 MG tablet Take 1 tablet (25 mg total) by mouth every morning.  ? SUMAtriptan (IMITREX) 100 MG tablet Take 1 tablet (100 mg total) by mouth every 2 (two) hours as needed for migraine or headache. May repeat in 2 hours if headache persists or recurs.  ? tirzepatide (MOUNJARO) 5 MG/0.5ML Pen Inject 5 mg into the skin once a week.  ? traMADol (ULTRAM) 50 MG tablet Take 1 tablet (50 mg total) by mouth See admin instructions. Take 50mg  twice a day. Take additional 50mg  once a day as needed for pain.  ? ?No facility-administered medications prior to visit.  ? ? ?Review of Systems  ?Constitutional: Negative.   ?HENT: Negative.    ?Eyes: Negative.   ?Respiratory: Negative.    ?Cardiovascular: Negative.   ?Gastrointestinal: Negative.   ?Genitourinary: Negative.   ?Musculoskeletal: Negative.   ?Skin: Negative.   ?Neurological: Negative.    ?Psychiatric/Behavioral: Negative.    ?All other systems reviewed and are negative. ? ?Last CBC ?Lab Results  ?Component Value Date  ? WBC 6.2 04/12/2021  ? HGB 14.2 04/12/2021  ? HCT 44.1 04/12/2021  ? MCV 90.1 04/12/2021

## 2021-06-01 ENCOUNTER — Ambulatory Visit: Payer: Managed Care, Other (non HMO) | Admitting: Podiatry

## 2021-06-01 DIAGNOSIS — M2011 Hallux valgus (acquired), right foot: Secondary | ICD-10-CM

## 2021-06-01 DIAGNOSIS — M2142 Flat foot [pes planus] (acquired), left foot: Secondary | ICD-10-CM

## 2021-06-01 DIAGNOSIS — E1142 Type 2 diabetes mellitus with diabetic polyneuropathy: Secondary | ICD-10-CM | POA: Diagnosis not present

## 2021-06-01 DIAGNOSIS — M2012 Hallux valgus (acquired), left foot: Secondary | ICD-10-CM

## 2021-06-01 DIAGNOSIS — E119 Type 2 diabetes mellitus without complications: Secondary | ICD-10-CM

## 2021-06-01 DIAGNOSIS — M2141 Flat foot [pes planus] (acquired), right foot: Secondary | ICD-10-CM

## 2021-06-01 DIAGNOSIS — M2041 Other hammer toe(s) (acquired), right foot: Secondary | ICD-10-CM | POA: Diagnosis not present

## 2021-06-01 DIAGNOSIS — M2042 Other hammer toe(s) (acquired), left foot: Secondary | ICD-10-CM

## 2021-06-01 NOTE — Patient Instructions (Signed)
Diabetes Mellitus and Foot Care Foot care is an important part of your health, especially when you have diabetes. Diabetes may cause you to have problems because of poor blood flow (circulation) to your feet and legs, which can cause your skin to: Become thinner and drier. Break more easily. Heal more slowly. Peel and crack. You may also have nerve damage (neuropathy) in your legs and feet, causing decreased feeling in them. This means that you may not notice minor injuries to your feet that could lead to more serious problems. Noticing and addressing any potential problems early is the best way to prevent future foot problems. How to care for your feet Foot hygiene  Wash your feet daily with warm water and mild soap. Do not use hot water. Then, pat your feet and the areas between your toes until they are completely dry. Do not soak your feet as this can dry your skin. Trim your toenails straight across. Do not dig under them or around the cuticle. File the edges of your nails with an emery board or nail file. Apply a moisturizing lotion or petroleum jelly to the skin on your feet and to dry, brittle toenails. Use lotion that does not contain alcohol and is unscented. Do not apply lotion between your toes. Shoes and socks Wear clean socks or stockings every day. Make sure they are not too tight. Do not wear knee-high stockings since they may decrease blood flow to your legs. Wear shoes that fit properly and have enough cushioning. Always look in your shoes before you put them on to be sure there are no objects inside. To break in new shoes, wear them for just a few hours a day. This prevents injuries on your feet. Wounds, scrapes, corns, and calluses  Check your feet daily for blisters, cuts, bruises, sores, and redness. If you cannot see the bottom of your feet, use a mirror or ask someone for help. Do not cut corns or calluses or try to remove them with medicine. If you find a minor scrape,  cut, or break in the skin on your feet, keep it and the skin around it clean and dry. You may clean these areas with mild soap and water. Do not clean the area with peroxide, alcohol, or iodine. If you have a wound, scrape, corn, or callus on your foot, look at it several times a day to make sure it is healing and not infected. Check for: Redness, swelling, or pain. Fluid or blood. Warmth. Pus or a bad smell. General tips Do not cross your legs. This may decrease blood flow to your feet. Do not use heating pads or hot water bottles on your feet. They may burn your skin. If you have lost feeling in your feet or legs, you may not know this is happening until it is too late. Protect your feet from hot and cold by wearing shoes, such as at the beach or on hot pavement. Schedule a complete foot exam at least once a year (annually) or more often if you have foot problems. Report any cuts, sores, or bruises to your health care provider immediately. Where to find more information American Diabetes Association: www.diabetes.org Association of Diabetes Care & Education Specialists: www.diabeteseducator.org Contact a health care provider if: You have a medical condition that increases your risk of infection and you have any cuts, sores, or bruises on your feet. You have an injury that is not healing. You have redness on your legs or feet. You   feel burning or tingling in your legs or feet. You have pain or cramps in your legs and feet. Your legs or feet are numb. Your feet always feel cold. You have pain around any toenails. Get help right away if: You have a wound, scrape, corn, or callus on your foot and: You have pain, swelling, or redness that gets worse. You have fluid or blood coming from the wound, scrape, corn, or callus. Your wound, scrape, corn, or callus feels warm to the touch. You have pus or a bad smell coming from the wound, scrape, corn, or callus. You have a fever. You have a red  line going up your leg. Summary Check your feet every day for blisters, cuts, bruises, sores, and redness. Apply a moisturizing lotion or petroleum jelly to the skin on your feet and to dry, brittle toenails. Wear shoes that fit properly and have enough cushioning. If you have foot problems, report any cuts, sores, or bruises to your health care provider immediately. Schedule a complete foot exam at least once a year (annually) or more often if you have foot problems. This information is not intended to replace advice given to you by your health care provider. Make sure you discuss any questions you have with your health care provider. Document Revised: 07/29/2019 Document Reviewed: 07/29/2019 Elsevier Patient Education  2023 Elsevier Inc.  

## 2021-06-06 ENCOUNTER — Ambulatory Visit: Payer: Managed Care, Other (non HMO) | Admitting: Podiatry

## 2021-06-09 ENCOUNTER — Encounter: Payer: Self-pay | Admitting: Podiatry

## 2021-06-09 NOTE — Progress Notes (Signed)
Subjective: Kelsey Simmons presents today for diabetic foot evaluation.  Patient relates 2 year h/o diabetes.  Patient denies any h/o foot wounds.  Patient has been diagnosed with neuropathy and it is managed with gabapentin.  Last known HgA1c was 6.7%. Patient did not check blood glucose this morning.  Risk factors: diabetes, HTN, hypercholesterolemia.  PCP is Janeece Agee, NP , and last visit was May 29, 2021.  Past Medical History:  Diagnosis Date   Allergy    Anxiety    Arthritis    Bilateral sacroiliitis (HCC)    Chronic back pain    Depression 2020   in therapy but not on medications   Diabetes mellitus without complication (HCC)    type 2    Fibromyalgia    Headache(784.0)    HX MIGRAINES   History of kidney stones 2020   left parenchymal stone identified on CT 11/23/18   kidney cyst   Hypercholesterolemia    Hypertension    Lumbar spondylosis    Lumbosacral neuritis    Myofascial pain    Paresthesia of lower extremity    Sleep apnea    HAS NOT USED C PAP SINCE 2007 DUE TO WT LOSS    Trochanteric bursitis    left hip    Patient Active Problem List   Diagnosis Date Noted   Rash and nonspecific skin eruption 04/13/2021   Bone spur of foot 07/31/2020   Cervical spondylitis (HCC) 03/02/2020   BMI 40.0-44.9, adult (HCC) 07/06/2019   Ovarian cyst, left 04/09/2019   Exposure to severe acute respiratory syndrome coronavirus 2 (SARS-CoV-2) 02/13/2019   Abdominal pain 02/09/2019   Acquired spondylolisthesis 02/09/2019   Allergic rhinitis 02/09/2019   Constipation 02/09/2019   Fibrocystic breast changes 02/09/2019   Fibromyalgia 02/09/2019   Gastroesophageal reflux disease without esophagitis 02/09/2019   Iron deficiency anemia 02/09/2019   Migraine with aura 02/09/2019   Pure hypercholesterolemia 02/09/2019   Bilateral sacroiliitis (HCC) 01/26/2019   Diabetes mellitus without complication (HCC) 09/22/2018   Hypertension 04/12/2015   History of total knee  arthroplasty 04/13/2014   Prolapsed lumbar disc 10/14/2013   Osteoarthritis of right knee 08/18/2013   Hx of total knee arthroplasty 08/18/2013   Inflammation of sacroiliac joint (HCC) 11/09/2012    Past Surgical History:  Procedure Laterality Date   ABDOMINAL HYSTERECTOMY  1997   BUNIONECTOMY  2012   CESAREAN SECTION     GASTRIC BYPASS  2006   JOINT REPLACEMENT     RTKA 7'15   ROBOTIC ASSISTED LAPAROSCOPIC LYSIS OF ADHESION N/A 04/19/2019   Procedure: XI ROBOTIC ASSISTED LAPAROSCOPIC LYSIS OF ADHESION;  Surgeon: Carver Fila, MD;  Location: [facility];  Service: Gynecology;  Laterality: N/A;   ROBOTIC ASSISTED SALPINGO OOPHERECTOMY Left 04/19/2019   Procedure: XI ROBOTIC ASSISTED SALPINGO OOPHORECTOMY;  Surgeon: Carver Fila, MD;  Location: [facility];  Service: Gynecology;  Laterality: Left;   TOTAL KNEE ARTHROPLASTY Right 08/18/2013   Procedure: RIGHT TOTAL KNEE ARTHROPLASTY;  Surgeon: Jacki Cones, MD;  Location: [facility];  Service: Orthopedics;  Laterality: Right;   TOTAL KNEE ARTHROPLASTY Left 04/13/2014   Procedure: LEFT TOTAL KNEE ARTHROPLASTY;  Surgeon: Ranee Gosselin, MD;  Location: [facility];  Service: Orthopedics;  Laterality: Left;   VENTRAL HERNIA REPAIR N/A 01/11/2019   Procedure: LAPAROSCOPIC VENTRAL HERNIA REPAIR WITH MESH;  Surgeon: Diamantina Monks, MD;  Location: [facility];  Service: General;  Laterality: N/A;    Current Outpatient Medications on File Prior to Visit  Medication  Sig Dispense Refill   atorvastatin (LIPITOR) 20 MG tablet Take by mouth.     cyclobenzaprine (FLEXERIL) 10 MG tablet Take by mouth.     fexofenadine (ALLEGRA) 180 MG tablet Take by mouth.     gabapentin (NEURONTIN) 600 MG tablet Take by mouth.     amLODipine (NORVASC) 5 MG tablet Take 1 tablet (5 mg total) by mouth daily. 90 tablet 1   clotrimazole-betamethasone (LOTRISONE) cream Apply 1 application. topically daily. 30 g 0   diclofenac Sodium (VOLTAREN) 1 % GEL Apply topically 4 (four)  times daily.     DULoxetine (CYMBALTA) 60 MG capsule Take 1 capsule (60 mg total) by mouth daily. 90 capsule 1   fluticasone (FLONASE) 50 MCG/ACT nasal spray Place 2 sprays into both nostrils daily. 16 g 6   hydrochlorothiazide (HYDRODIURIL) 25 MG tablet Take 1 tablet (25 mg total) by mouth every morning. 90 tablet 1   SUMAtriptan (IMITREX) 100 MG tablet Take 1 tablet (100 mg total) by mouth every 2 (two) hours as needed for migraine or headache. May repeat in 2 hours if headache persists or recurs. 10 tablet 5   tirzepatide (MOUNJARO) 5 MG/0.5ML Pen Inject 5 mg into the skin once a week. 6 mL 1   traMADol (ULTRAM) 50 MG tablet Take 1 tablet (50 mg total) by mouth See admin instructions. Take 50mg  twice a day. Take additional 50mg  once a day as needed for pain. 90 tablet 2   No current facility-administered medications on file prior to visit.     Allergies  Allergen Reactions   Topiramate     Other reaction(s): double vision   Chlorhexidine Gluconate Rash   Tape Rash    DERMABOND    Social History   Occupational History   Not on file  Tobacco Use   Smoking status: Never   Smokeless tobacco: Never  Vaping Use   Vaping Use: Never used  Substance and Sexual Activity   Alcohol use: Not Currently    Comment: quit 1994   Drug use: Not Currently    Types: "Crack" cocaine    Comment: quit 1994   Sexual activity: Not Currently    Birth control/protection: Post-menopausal    Family History  Problem Relation Age of Onset   Diabetes Mellitus II Mother    Lupus Mother    Arthritis Mother    Diabetes Mother    Hypertension Mother    Miscarriages / Mother    Lung cancer Father        smoker/worked in factory   Alcohol abuse Father    Cancer Father        smoker, worked in 1995   Early death Father    Hypertension Father    Diabetes Mellitus II Sister    Arthritis Sister    Diabetes Sister    Hypertension Sister    Obesity Sister    Diabetes Mellitus II  Brother    Hypercholesterolemia Brother    Diabetes Brother    Cancer Maternal Grandmother        Either uterine or endometrial   Colon cancer Neg Hx    Breast cancer Neg Hx     Immunization History  Administered Date(s) Administered   Influenza Split 11/11/2014   Influenza,inj,quad, With Preservative 11/19/2018   Influenza-Unspecified 11/10/2015, 11/01/2016, 10/29/2017   PFIZER(Purple Top)SARS-COV-2 Vaccination 04/30/2019, 05/28/2019, 12/06/2019    Objective: There were no vitals filed for this visit.  Kelsey Simmons is a pleasant 61 y.o. female obese  in NAD. AAO X 3.  Vascular Examination: CFT immediate b/l LE. Palpable DP/PT pulses b/l LE. Digital hair present b/l. Skin temperature gradient WNL b/l. No pain with calf compression b/l. No edema noted b/l. No cyanosis or clubbing noted b/l LE.  Dermatological Examination: Pedal integument with normal turgor, texture and tone BLE. No open wounds b/l LE. No interdigital macerations noted b/l LE. Toenails 1-5 b/l well maintained with adequate length. No erythema, no edema, no drainage, no fluctuance. No hyperkeratotic nor porokeratotic lesions present on today's visit. Well healed bunion scar noted LLE.  Neurological Examination: Pt has subjective symptoms of neuropathy. Protective sensation intact 5/5 intact bilaterally with 10g monofilament b/l. Vibratory sensation intact b/l. Proprioception intact bilaterally.  Musculoskeletal Examination: Normal muscle strength 5/5 to all lower extremity muscle groups bilaterally. Hallux valgus with bunion deformity noted right lower extremity. Hammertoe deformity noted 2-5 b/l. Pes planus deformity noted bilateral LE.Marland Kitchen. No pain, crepitus or joint limitation noted with ROM b/l LE.  Patient ambulates independently without assistive aids.  Footwear Assessment: Does the patient wear appropriate shoes? Yes. Does the patient need inserts/orthotics? Yes.  A1c:      Latest Ref Rng & Units 04/12/2021    10:42 AM  Hemoglobin A1C  Hemoglobin-A1c 4.6 - 6.5 % 6.7     Assessment: 1. Hallux valgus, acquired, bilateral   2. Acquired hammertoes of both feet   3. Pes planus of both feet   4. Diabetic peripheral neuropathy associated with type 2 diabetes mellitus (HCC)   5. Encounter for diabetic foot exam (HCC)      ADA Risk Categorization: Low Risk:  Patient has all of the following: Intact protective sensation No prior foot ulcer  No severe deformity Pedal pulses present  Plan: -Patient was evaluated and treated. All patient's and/or POA's questions/concerns answered on today's visit. -Diabetic foot examination performed today. -Continue diabetic foot care principles: inspect feet daily, monitor glucose as recommended by PCP and/or Endocrinologist, and follow prescribed diet per PCP, Endocrinologist and/or dietician. -Patient to continue soft, supportive shoe gear daily. -Patient/POA to call should there be question/concern in the interim.  Return in about 1 year (around 06/02/2022).  Freddie BreechJennifer L Stehanie Ekstrom, DPM

## 2021-07-06 ENCOUNTER — Ambulatory Visit: Payer: Managed Care, Other (non HMO) | Admitting: Family Medicine

## 2021-07-26 ENCOUNTER — Telehealth: Payer: Self-pay | Admitting: Family Medicine

## 2021-07-26 NOTE — Telephone Encounter (Signed)
Pt's provider is leaving practice and she would like to transfer over to Dr Salomon Fick.

## 2021-07-27 NOTE — Telephone Encounter (Signed)
Ok

## 2021-07-27 NOTE — Telephone Encounter (Signed)
Ok by me! Thanks,  Rich

## 2021-07-28 ENCOUNTER — Ambulatory Visit (HOSPITAL_COMMUNITY)
Admission: EM | Admit: 2021-07-28 | Discharge: 2021-07-28 | Disposition: A | Discharge status: Home / Self Care | Payer: Managed Care, Other (non HMO) | Attending: Physician Assistant | Admitting: Physician Assistant

## 2021-07-28 ENCOUNTER — Encounter (HOSPITAL_COMMUNITY): Payer: Self-pay

## 2021-07-28 DIAGNOSIS — M5442 Lumbago with sciatica, left side: Secondary | ICD-10-CM

## 2021-07-28 MED ORDER — KETOROLAC TROMETHAMINE 30 MG/ML IJ SOLN
30.0000 mg | Freq: Once | INTRAMUSCULAR | Status: AC
  Administered 2021-07-28: 30 mg via INTRAMUSCULAR

## 2021-07-28 MED ORDER — KETOROLAC TROMETHAMINE 30 MG/ML IJ SOLN
INTRAMUSCULAR | Status: AC
  Filled 2021-07-28: qty 1

## 2021-07-28 MED ORDER — IBUPROFEN 800 MG PO TABS: 800.0000 mg | ORAL_TABLET | Freq: Three times a day (TID) | ORAL | 0 refills | Status: DC | PRN

## 2021-07-28 MED ORDER — CYCLOBENZAPRINE HCL 10 MG PO TABS: 10.0000 mg | ORAL_TABLET | Freq: Three times a day (TID) | ORAL | 0 refills | Status: DC | PRN

## 2021-07-28 NOTE — Discharge Instructions (Addendum)
-  Toradol IM given today -Avoid NSAIDs for the remainder of the day -You may take your usual medication regimen -Flexeril up to three times daily as needed for spasm -Ibuprofen 800 mg starting tomorrow up to three times daily as needed with food -Ice or heat to back, gentle stretches

## 2021-07-28 NOTE — ED Provider Notes (Signed)
[facility]   MRN: [id number] DOB: 06/26/1960  Subjective:   Kelsey Simmons is a 61 y.o. female with history of chronic back pain, fibromyalgia, lumbar spondylosis, presents today for left-sided lower back pain that goes down the back of her left leg that started approximately 3 days ago.  States that she had some coughing and congestion prior to her pain and thinks that she might of had a coughing fit that resulted in a back spasm.  She denies any other injury or aggravating activity.  She states that sitting worsens the pain and raising her right leg up also causes worsening pain.  If she gets up and moving it does ease up a little bit.  She has Cymbalta, tramadol, gabapentin that she has been taking as prescribed at home.  She also took Tylenol last night with the tramadol and that seemed to help her get some rest.  Negative for any fevers, saddle anesthesia, foot-drop, incontinence of urine or stool, and no recent injury.   No current facility-administered medications for this encounter.  Current Outpatient Medications:    amLODipine (NORVASC) 5 MG tablet, Take 1 tablet (5 mg total) by mouth daily., Disp: 90 tablet, Rfl: 1   atorvastatin (LIPITOR) 20 MG tablet, Take by mouth., Disp: , Rfl:    diclofenac Sodium (VOLTAREN) 1 % GEL, Apply topically 4 (four) times daily., Disp: , Rfl:    DULoxetine (CYMBALTA) 60 MG capsule, Take 1 capsule (60 mg total) by mouth daily., Disp: 90 capsule, Rfl: 1   fexofenadine (ALLEGRA) 180 MG tablet, Take by mouth., Disp: , Rfl:    fluticasone (FLONASE) 50 MCG/ACT nasal spray, Place 2 sprays into both nostrils daily., Disp: 16 g, Rfl: 6   gabapentin (NEURONTIN) 600 MG tablet, Take by mouth., Disp: , Rfl:    hydrochlorothiazide (HYDRODIURIL) 25 MG tablet, Take 1 tablet (25 mg total) by mouth every morning., Disp: 90 tablet, Rfl: 1   ibuprofen (ADVIL) 800 MG tablet, Take 1 tablet (800 mg total) by mouth every 8 (eight) hours as needed for  moderate pain., Disp: 21 tablet, Rfl: 0   tirzepatide (MOUNJARO) 5 MG/0.5ML Pen, Inject 5 mg into the skin once a week., Disp: 6 mL, Rfl: 1   traMADol (ULTRAM) 50 MG tablet, Take 1 tablet (50 mg total) by mouth See admin instructions. Take 50mg  twice a day. Take additional 50mg  once a day as needed for pain., Disp: 90 tablet, Rfl: 2   clotrimazole-betamethasone (LOTRISONE) cream, Apply 1 application. topically daily., Disp: 30 g, Rfl: 0   cyclobenzaprine (FLEXERIL) 10 MG tablet, Take 1 tablet (10 mg total) by mouth 3 (three) times daily as needed for muscle spasms., Disp: 30 tablet, Rfl: 0   SUMAtriptan (IMITREX) 100 MG tablet, Take 1 tablet (100 mg total) by mouth every 2 (two) hours as needed for migraine or headache. May repeat in 2 hours if headache persists or recurs., Disp: 10 tablet, Rfl: 5   Allergies  Allergen Reactions   Topiramate     Other reaction(s): double vision   Chlorhexidine Gluconate Rash   Tape Rash    DERMABOND    Past Medical History:  Diagnosis Date   Allergy    Anxiety    Arthritis    Bilateral sacroiliitis (HCC)    Chronic back pain    Depression 2020   in therapy but not on medications   Diabetes mellitus without complication (HCC)    type 2    Fibromyalgia  Headache(784.0)    HX MIGRAINES   History of kidney stones 2020   left parenchymal stone identified on CT 11/23/18   kidney cyst   Hypercholesterolemia    Hypertension    Lumbar spondylosis    Lumbosacral neuritis    Myofascial pain    Paresthesia of lower extremity    Sleep apnea    HAS NOT USED C PAP SINCE 2007 DUE TO WT LOSS    Trochanteric bursitis    left hip     Past Surgical History:  Procedure Laterality Date   ABDOMINAL HYSTERECTOMY  1997   BUNIONECTOMY  2012   CESAREAN SECTION     GASTRIC BYPASS  2006   JOINT REPLACEMENT     RTKA 7'15   ROBOTIC ASSISTED LAPAROSCOPIC LYSIS OF ADHESION N/A 04/19/2019   Procedure: XI ROBOTIC ASSISTED LAPAROSCOPIC LYSIS OF ADHESION;  Surgeon:  Carver Fila, MD;  Location: [facility];  Service: Gynecology;  Laterality: N/A;   ROBOTIC ASSISTED SALPINGO OOPHERECTOMY Left 04/19/2019   Procedure: XI ROBOTIC ASSISTED SALPINGO OOPHORECTOMY;  Surgeon: Carver Fila, MD;  Location: [facility];  Service: Gynecology;  Laterality: Left;   TOTAL KNEE ARTHROPLASTY Right 08/18/2013   Procedure: RIGHT TOTAL KNEE ARTHROPLASTY;  Surgeon: Jacki Cones, MD;  Location: [facility];  Service: Orthopedics;  Laterality: Right;   TOTAL KNEE ARTHROPLASTY Left 04/13/2014   Procedure: LEFT TOTAL KNEE ARTHROPLASTY;  Surgeon: Ranee Gosselin, MD;  Location: [facility];  Service: Orthopedics;  Laterality: Left;   VENTRAL HERNIA REPAIR N/A 01/11/2019   Procedure: LAPAROSCOPIC VENTRAL HERNIA REPAIR WITH MESH;  Surgeon: Diamantina Monks, MD;  Location: [facility];  Service: General;  Laterality: N/A;    Family History  Problem Relation Age of Onset   Diabetes Mellitus II Mother    Lupus Mother    Arthritis Mother    Diabetes Mother    Hypertension Mother    Miscarriages / [place] Mother    Lung cancer Father        smoker/worked in factory   Alcohol abuse Father    Cancer Father        smoker, worked in Marine scientist   Early death Father    Hypertension Father    Diabetes Mellitus II Sister    Arthritis Sister    Diabetes Sister    Hypertension Sister    Obesity Sister    Diabetes Mellitus II Brother    Hypercholesterolemia Brother    Diabetes Brother    Cancer Maternal Grandmother        Either uterine or endometrial   Colon cancer Neg Hx    Breast cancer Neg Hx     Social History   Tobacco Use   Smoking status: Never   Smokeless tobacco: Never  Vaping Use   Vaping Use: Never used  Substance Use Topics   Alcohol use: Not Currently    Comment: quit 1994   Drug use: Not Currently    Types: "Crack" cocaine    Comment: quit 1994    ROS REFER TO HPI FOR PERTINENT POSITIVES AND NEGATIVES   Objective:   Vitals: BP (!) 113/54 (BP Location: Left  Arm)   Pulse 73   Temp 97.9 F (36.6 C) (Oral)   Resp 16   Ht 5\' 1"  (1.549 m)   Wt 225 lb (102.1 kg)   SpO2 100%   BMI 42.51 kg/m   Physical Exam Constitutional:      General: She is not in acute distress.  Appearance: Normal appearance. She is not toxic-appearing.     Comments: wheelchair  Cardiovascular:     Rate and Rhythm: Normal rate and regular rhythm.     Pulses: Normal pulses.     Heart sounds: Normal heart sounds.  Pulmonary:     Effort: Pulmonary effort is normal.     Breath sounds: Normal breath sounds.  Musculoskeletal:     Lumbar back: Spasms and tenderness present. Decreased range of motion. Positive right straight leg raise test and positive left straight leg raise test.  Neurological:     General: No focal deficit present.     Mental Status: She is alert and oriented to person, place, and time.     Motor: No weakness.  Psychiatric:        Mood and Affect: Mood normal.     No results found for this or any previous visit (from the past 24 hour(s)).  Assessment and Plan :   PDMP not reviewed this encounter.  1. Acute left-sided low back pain with left-sided sciatica   Acute flareup of left-sided low back pain with left-sided sciatica.  Patient was given Toradol 30 mg IM in the urgent care today as she has done well with this in the past she says.  She was also given a prescription for ibuprofen 800 mg and Flexeril 10 mg to take as directed.  She needs to avoid NSAIDs for the rest of the day after Toradol, but she may start on ibuprofen 800 mg with food tomorrow if she is still having pain.  She will continue her regular medication regimen.  She will try ice or heat to the back and gentle stretches.  She will need to follow-up with her primary care and she may need to consider physical therapy if worse or no improvement.  Strict ER precautions including red flags discussed with the patient today.     AllwardtCrist Infante, PA-C 07/28/21 1338

## 2021-07-28 NOTE — ED Triage Notes (Signed)
Back pain and spasms since Wednesday. Worsened on Thursday, states it was hard to sit down and get up. States it was hard to move period.   No known injuries or aggravating activities.

## 2021-07-31 ENCOUNTER — Ambulatory Visit: Payer: Managed Care, Other (non HMO) | Admitting: Registered Nurse

## 2021-08-31 ENCOUNTER — Other Ambulatory Visit: Payer: Self-pay

## 2021-08-31 ENCOUNTER — Other Ambulatory Visit: Payer: Self-pay | Admitting: Registered Nurse

## 2021-08-31 DIAGNOSIS — E1169 Type 2 diabetes mellitus with other specified complication: Secondary | ICD-10-CM

## 2021-08-31 MED ORDER — TIRZEPATIDE 5 MG/0.5ML ~~LOC~~ SOAJ: 5.0000 mg | SUBCUTANEOUS | 1 refills | Status: DC

## 2021-09-10 LAB — HM DIABETES EYE EXAM

## 2021-09-28 ENCOUNTER — Encounter: Payer: Self-pay | Admitting: Family Medicine

## 2021-10-15 ENCOUNTER — Ambulatory Visit: Payer: Managed Care, Other (non HMO) | Admitting: Family Medicine

## 2021-10-15 ENCOUNTER — Encounter: Payer: Self-pay | Admitting: Family Medicine

## 2021-10-15 VITALS — BP 132/88 | HR 74 | Temp 98.5°F | Ht 61.0 in | Wt 234.8 lb

## 2021-10-15 DIAGNOSIS — G8929 Other chronic pain: Secondary | ICD-10-CM

## 2021-10-15 DIAGNOSIS — G629 Polyneuropathy, unspecified: Secondary | ICD-10-CM | POA: Diagnosis not present

## 2021-10-15 DIAGNOSIS — F320 Major depressive disorder, single episode, mild: Secondary | ICD-10-CM

## 2021-10-15 DIAGNOSIS — M25562 Pain in left knee: Secondary | ICD-10-CM

## 2021-10-15 DIAGNOSIS — Z23 Encounter for immunization: Secondary | ICD-10-CM | POA: Diagnosis not present

## 2021-10-15 DIAGNOSIS — M5442 Lumbago with sciatica, left side: Secondary | ICD-10-CM | POA: Diagnosis not present

## 2021-10-15 DIAGNOSIS — M25561 Pain in right knee: Secondary | ICD-10-CM

## 2021-10-15 DIAGNOSIS — E782 Mixed hyperlipidemia: Secondary | ICD-10-CM

## 2021-10-15 DIAGNOSIS — F419 Anxiety disorder, unspecified: Secondary | ICD-10-CM

## 2021-10-15 DIAGNOSIS — Z96653 Presence of artificial knee joint, bilateral: Secondary | ICD-10-CM

## 2021-10-15 DIAGNOSIS — I83813 Varicose veins of bilateral lower extremities with pain: Secondary | ICD-10-CM

## 2021-10-15 DIAGNOSIS — M5441 Lumbago with sciatica, right side: Secondary | ICD-10-CM

## 2021-10-15 DIAGNOSIS — G4733 Obstructive sleep apnea (adult) (pediatric): Secondary | ICD-10-CM

## 2021-10-15 DIAGNOSIS — E1169 Type 2 diabetes mellitus with other specified complication: Secondary | ICD-10-CM | POA: Diagnosis not present

## 2021-10-15 NOTE — Progress Notes (Signed)
Subjective:    Patient ID: Kelsey Simmons, female    DOB: 08/05/1960, 61 y.o.   MRN: [id number]  Chief Complaint  Patient presents with   Establish Care    TOC -A1c may have went up. Consider buproprion  for wt loss. Pain in neck and low back and leg numbness, that has been ongoing. Previous doctor was pushing it off as workers comp, but another dr said it was not related.  Knee pain, sleep study and varicose veins     HPI Patient is a 61 yo female seen today for TOC, previously seen by Janeece Agee.    DM2: -On Mounjaro -Not Testing blood sugar -thinks A1C likely higher  HLD  Knee pain: -Chronic -Seen by Tomasita Crumble in 2015/16 or 2016/2017 for b/l TKR -Increasing pain in the last few months -Notes pain with steps and lifting leg(s) to get into bathtub.  Obesity: -pt states she could loose wt. -not currently exercising -states eating for comfort 2/2 anxiety/depression.  States has never really realized that/said it out loud.  Anxiety/Depression: -pt notes increased stress/worrying  -concerned about her grandson.  States he is "not in the best situation", but pt feels stuck in the middle.  Does not want to upset her son and the child's mother as they may not let her have contact with him. -pt notes staying on the phone late at times with her grandson when he is worried.  Varicose veins: -causing a burning pain in b/l LEs -at times red, swollen  H/o OSA: -has not been able to wear CPAP x 2 yrs as "dog ate mask"  Chronic back pain: -having numbness in legs -has h/o injury at work, but told it may not be related -no recent imaging  Migraines: -Better since gyn surgery -no recent HAs  Past medical history: Left oophorectomy 2/2 cyst Hernia repair Hysterectomy 97 C-section 92 Left bunion  Past Medical History:  Diagnosis Date   Allergy    Anxiety    Arthritis    Bilateral sacroiliitis (HCC)    Chronic back pain    Depression 2020   in therapy but  not on medications   Diabetes mellitus without complication (HCC)    type 2    Fibromyalgia    Headache(784.0)    HX MIGRAINES   History of kidney stones 2020   left parenchymal stone identified on CT 11/23/18   kidney cyst   Hypercholesterolemia    Hypertension    Lumbar spondylosis    Lumbosacral neuritis    Myofascial pain    Paresthesia of lower extremity    Sleep apnea    HAS NOT USED C PAP SINCE 2007 DUE TO WT LOSS    Trochanteric bursitis    left hip    Allergies  Allergen Reactions   Topiramate     Other reaction(s): double vision   Chlorhexidine Gluconate Rash   Tape Rash    DERMABOND    ROS General: Denies fever, chills, night sweats   +changes in weight, changes in appetite HEENT: Denies headaches, ear pain, changes in vision, rhinorrhea, sore throat CV: Denies CP, palpitations, SOB, orthopnea  +painful varicose veins Pulm: Denies SOB, cough, wheezing GI: Denies abdominal pain, nausea, vomiting, diarrhea, constipation GU: Denies dysuria, hematuria, frequency, vaginal discharge Msk: Denies muscle cramps, joint pains  +back pain, b/l knee pain Neuro: Denies weakness, numbness, tingling  +numbness/tingling in LEs Skin: Denies rashes, bruising Psych: Denies depression, anxiety, hallucinations +anxiety, depression     Objective:  Blood pressure 132/88, pulse 74, temperature 98.5 F (36.9 C), temperature source Oral, height 5\' 1"  (1.549 m), weight 234 lb 12.8 oz (106.5 kg), SpO2 99 %. Body mass index is 44.37 kg/m.  Gen. Pleasant, well-nourished, in no distress, normal affect   HEENT: [place]/AT, face symmetric, conjunctiva clear, no scleral icterus, PERRLA, EOMI, nares patent without drainage Lungs: no accessory muscle use, CTAB, no wheezes or rales Cardiovascular: RRR, no m/r/g, no peripheral edema Abdomen: BS present, soft, NT/ND Musculoskeletal: No deformities, no cyanosis or clubbing, normal tone Neuro:  A&Ox3, CN II-XII intact, normal gait Skin:  Warm,  no lesions/ rash     10/15/2021    3:48 PM 05/29/2021    9:40 AM 04/12/2021    9:58 AM  Depression screen PHQ 2/9  Decreased Interest 1 1 1   Down, Depressed, Hopeless 1 1 1   PHQ - 2 Score 2 2 2   Altered sleeping 0 1 1  Tired, decreased energy 1 1 1   Change in appetite 2 0 1  Feeling bad or failure about yourself  0 0 1  Trouble concentrating 0 0 1  Moving slowly or fidgety/restless 0 0 0  Suicidal thoughts 0 0 0  PHQ-9 Score 5 4 7   Difficult doing work/chores Somewhat difficult Not difficult at all Somewhat difficult    Wt Readings from Last 3 Encounters:  10/15/21 234 lb 12.8 oz (106.5 kg)  07/28/21 225 lb (102.1 kg)  05/29/21 222 lb 3.2 oz (100.8 kg)    Lab Results  Component Value Date   WBC 6.2 04/12/2021   HGB 14.2 04/12/2021   HCT 44.1 04/12/2021   PLT 244.0 04/12/2021   GLUCOSE 82 04/12/2021   CHOL 235 (H) 04/12/2021   TRIG 86.0 04/12/2021   HDL 111.20 04/12/2021   LDLCALC 106 (H) 04/12/2021   ALT 14 04/12/2021   AST 23 04/12/2021   NA 141 04/12/2021   K 3.9 hemolyzed sample 04/12/2021   CL 98 04/12/2021   CREATININE 0.81 04/12/2021   BUN 16 04/12/2021   CO2 33 (H) 04/12/2021   TSH 2.05 04/12/2021   INR 1.00 04/13/2014   HGBA1C 6.7 (H) 04/12/2021   MICROALBUR <0.7 04/12/2021    Assessment/Plan:  Chronic bilateral low back pain with bilateral sciatica -supportive care -encouraged to f/u with Ortho -would benefit from updated imaging -pt declines PT at this time.  Morbid obesity (HCC) -Body mass index is 44.37 kg/m. -lifestyle modifications encouraged -advised to look at relationship with food, eating for comfort -on mounjaro -consider wt management  Type 2 diabetes mellitus with other specified complication, without long-term current use of insulin (HCC) -Hemoglobin A1c 6.7% on 04/12/2021 -Continue current medications including Mounjaro 5 mg  Neuropathy -Likely multifactorial from chronic back pain and possible elevated bs. -also consider  vitamin def.  OSA (obstructive sleep apnea) -not currently on CPAP  Mixed hyperlipidemia -per chart review total cholesterol 235, HDL 111, LDL 106, and triglycerides 86 on 04/12/21 -lifestyle modifications -not currently on statin  Depression, major, single episode, mild (HCC) -PHQ 9 score 5 this visit -consider counseling -consider medication options for increased symptoms -given handout  Anxiety -increased -consider counseling -self care and setting boundaries encouraged.  Varicose veins of both lower extremities with pain -symptomatic -supportive care including elevating LEs, compression socks or TED hose  - Plan: Ambulatory referral to Vascular Surgery  Need for influenza vaccination  - Plan: Flu Vaccine QUAD 6+ mos PF IM (Fluarix Quad PF)  Chronic pain of both knees  -increased symptoms -  supportive care including stretching, topical analgesics -given h/o b/l TKR pt advised to f/u with Ortho.  Referral placed. - Plan: Ambulatory referral to Orthopedic Surgery -given hanodut  Total knee replacement status, bilateral  - Plan: Ambulatory referral to Orthopedic Surgery  F/u in 1 month  Grier Mitts, MD

## 2021-10-16 ENCOUNTER — Encounter: Payer: Self-pay | Admitting: Family Medicine

## 2021-10-24 ENCOUNTER — Ambulatory Visit: Payer: Managed Care, Other (non HMO) | Admitting: Orthopedic Surgery

## 2021-10-31 IMAGING — MG DIGITAL SCREENING BILAT W/ TOMO W/ CAD
6 of 12 series · 6 of 36 positions shown · non-contrast
Comparison: Previous exam(s).

CLINICAL DATA: Screening.

EXAM:
DIGITAL SCREENING BILATERAL MAMMOGRAM WITH TOMO AND CAD

[L MLO synth-2D (1 of 2)]
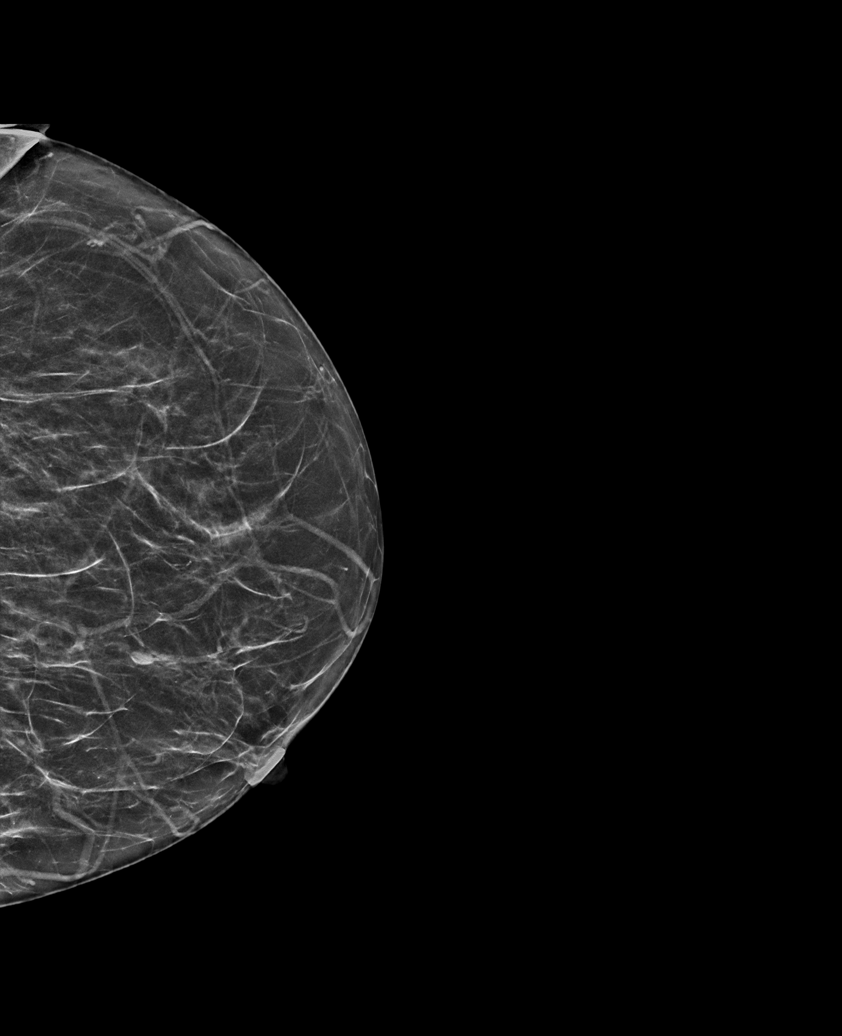

[R CC synth-2D]
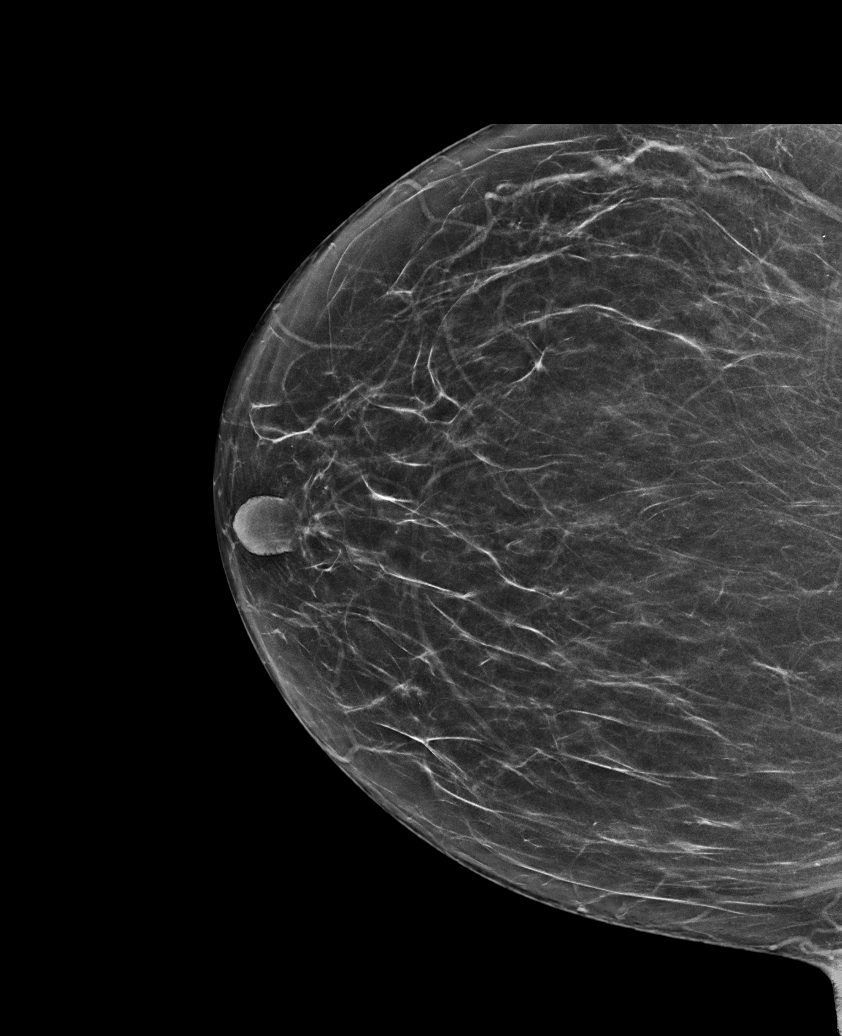

[L CC synth-2D]
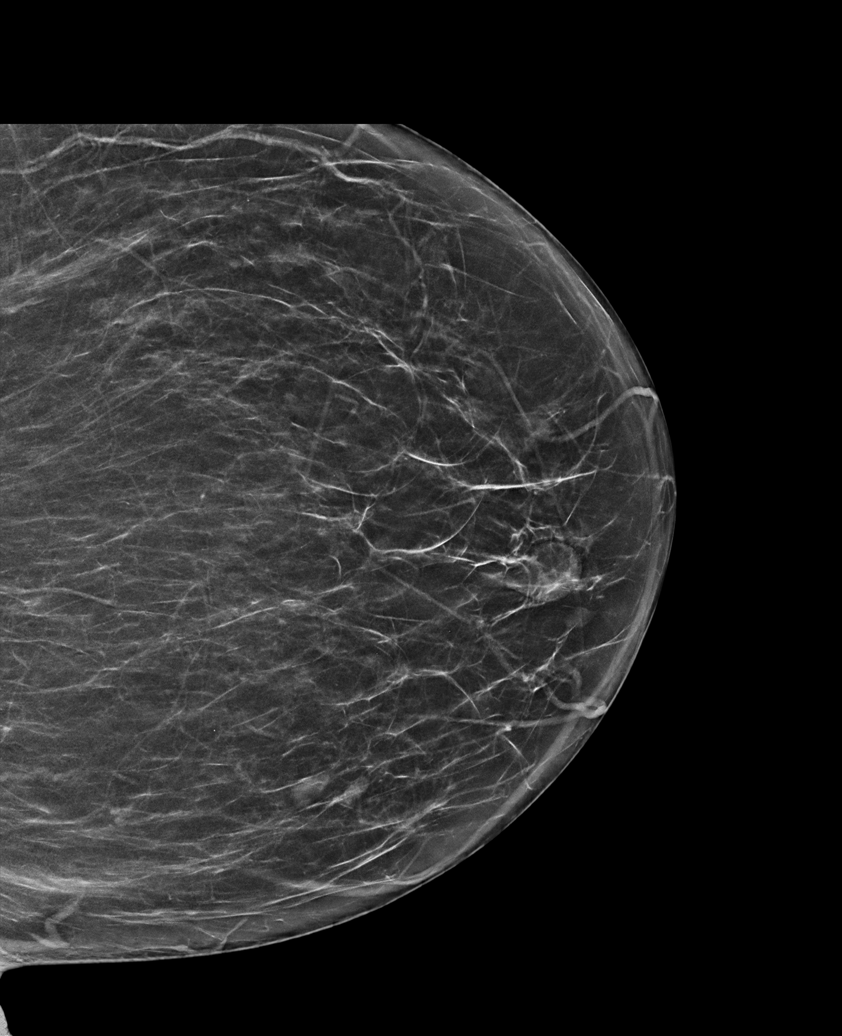

[R MLO synth-2D (1 of 2)]
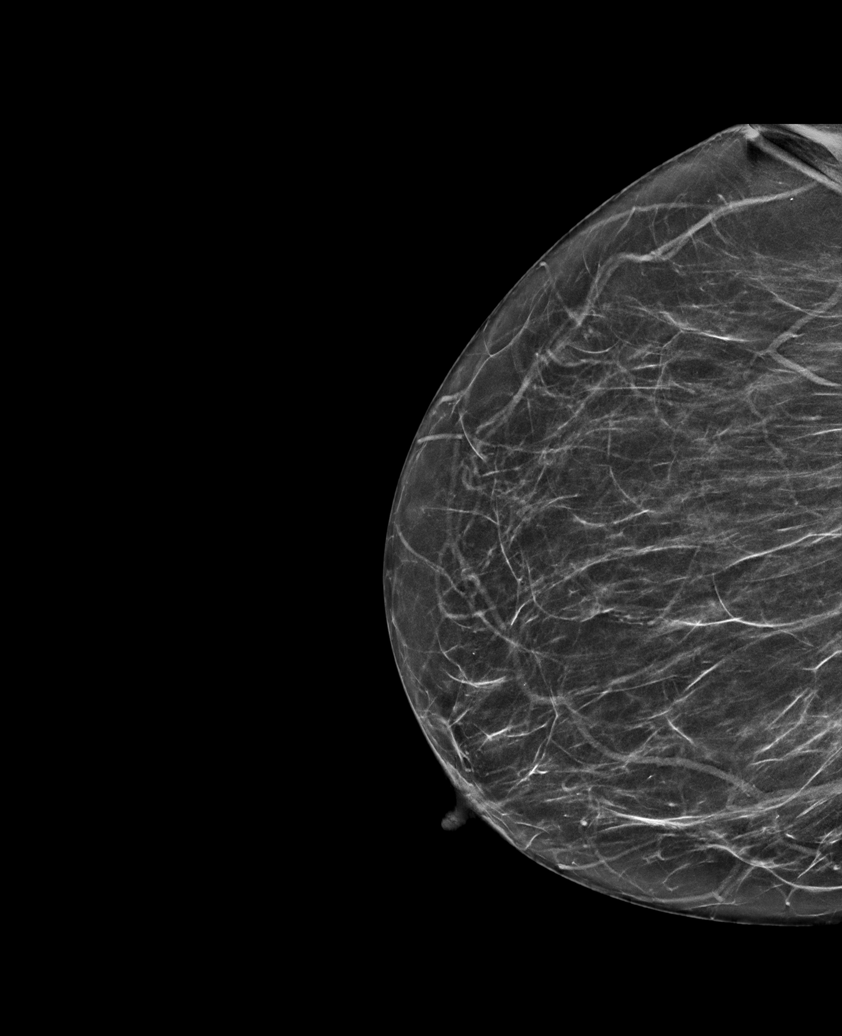

[R MLO synth-2D (2 of 2)]
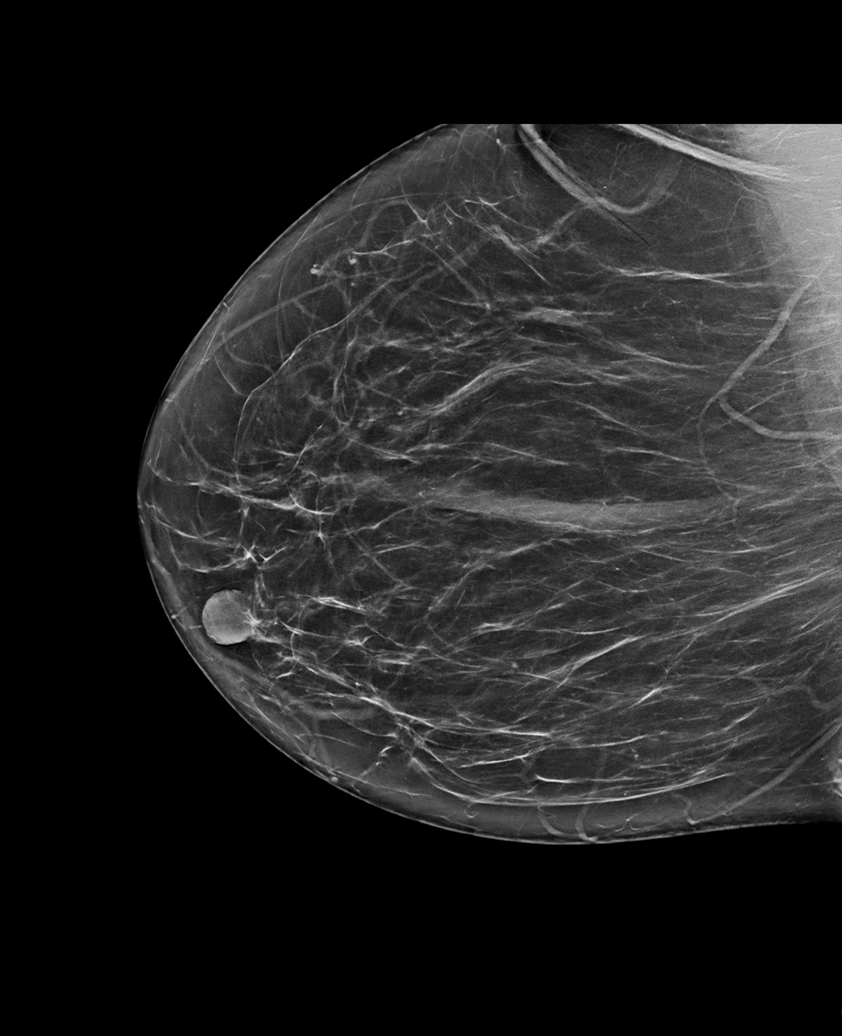

[L MLO synth-2D (2 of 2)]
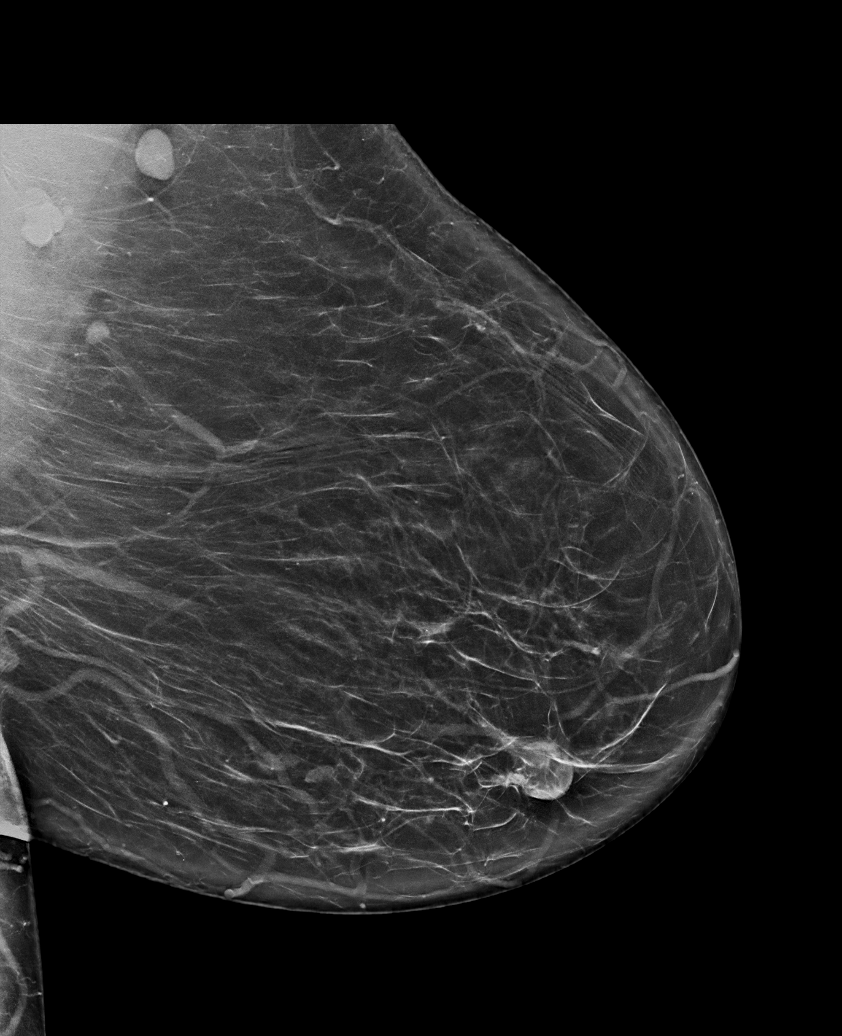

[6 of 36 positions shown; findings below may reference images not displayed]

ACR Breast Density Category b: There are scattered areas of
fibroglandular density.
FINDINGS: There are no findings suspicious for malignancy. Images were
processed with CAD.
IMPRESSION: No mammographic evidence of malignancy. A result letter of this
screening mammogram will be mailed directly to the patient.

RECOMMENDATION:
Screening mammogram in one year. (Code:[id number]-775)

BI-RADS CATEGORY  1: Negative.

## 2021-11-06 NOTE — Telephone Encounter (Signed)
Pt call back and stated she is waiting for a answer from dr.Bank about her last visit  I let her know that dr.Banks is out of the office and stated she will call back tomorrow.

## 2021-11-07 ENCOUNTER — Telehealth: Payer: Self-pay | Admitting: Registered Nurse

## 2021-11-07 NOTE — Telephone Encounter (Signed)
Requesting refill of traMADol (ULTRAM) 50 MG tablet  and says she discussed starting Welbutrin. Pt requesting A1C and cholesterol labs which she states was discussed at her last appointment on 10/15/21

## 2021-11-09 NOTE — Telephone Encounter (Signed)
Pt called again this morning and says she is still waiting for a response.  Please call back today.

## 2021-11-13 ENCOUNTER — Ambulatory Visit (HOSPITAL_BASED_OUTPATIENT_CLINIC_OR_DEPARTMENT_OTHER): Payer: Managed Care, Other (non HMO) | Admitting: Family Medicine

## 2021-11-13 ENCOUNTER — Encounter (HOSPITAL_BASED_OUTPATIENT_CLINIC_OR_DEPARTMENT_OTHER): Payer: Self-pay | Admitting: Family Medicine

## 2021-11-13 DIAGNOSIS — I10 Essential (primary) hypertension: Secondary | ICD-10-CM

## 2021-11-13 DIAGNOSIS — G8929 Other chronic pain: Secondary | ICD-10-CM

## 2021-11-13 DIAGNOSIS — M5442 Lumbago with sciatica, left side: Secondary | ICD-10-CM | POA: Diagnosis not present

## 2021-11-13 DIAGNOSIS — F320 Major depressive disorder, single episode, mild: Secondary | ICD-10-CM

## 2021-11-13 DIAGNOSIS — E119 Type 2 diabetes mellitus without complications: Secondary | ICD-10-CM

## 2021-11-13 DIAGNOSIS — M797 Fibromyalgia: Secondary | ICD-10-CM

## 2021-11-13 DIAGNOSIS — M5441 Lumbago with sciatica, right side: Secondary | ICD-10-CM

## 2021-11-13 DIAGNOSIS — E1169 Type 2 diabetes mellitus with other specified complication: Secondary | ICD-10-CM

## 2021-11-13 LAB — HEMOGLOBIN A1C
Est. average glucose Bld gHb Est-mCnc: 140 mg/dL
Hgb A1c MFr Bld: 6.5 % — ABNORMAL HIGH (ref 4.8–5.6)

## 2021-11-13 MED ORDER — TRAMADOL HCL 50 MG PO TABS: 50.0000 mg | ORAL_TABLET | ORAL | 0 refills | Status: DC

## 2021-11-13 NOTE — Progress Notes (Signed)
New Patient Office Visit  Subjective    Patient ID: Kelsey Simmons, female    DOB: 1960/09/08  Age: 61 y.o. MRN: [id number]  CC:  Chief Complaint  Patient presents with  . New Patient (Initial Visit)    HPI [name] presents to establish care Last PCP -     Patient is originally from [place], [place], has been here since 1999. Has been loving living here. She enjoys gardening, Barrister's clerk.   Outpatient Encounter Medications as of 11/13/2021  Medication Sig  . amLODipine (NORVASC) 5 MG tablet Take 1 tablet (5 mg total) by mouth daily.  Marland Kitchen atorvastatin (LIPITOR) 20 MG tablet Take by mouth.  . diclofenac Sodium (VOLTAREN) 1 % GEL Apply topically 4 (four) times daily.  . DULoxetine (CYMBALTA) 60 MG capsule Take 1 capsule (60 mg total) by mouth daily.  . fexofenadine (ALLEGRA) 180 MG tablet Take by mouth.  . fluticasone (FLONASE) 50 MCG/ACT nasal spray Place 2 sprays into both nostrils daily.  Marland Kitchen gabapentin (NEURONTIN) 600 MG tablet Take by mouth.  . hydrochlorothiazide (HYDRODIURIL) 25 MG tablet Take 1 tablet (25 mg total) by mouth every morning.  . SUMAtriptan (IMITREX) 100 MG tablet Take 1 tablet (100 mg total) by mouth every 2 (two) hours as needed for migraine or headache. May repeat in 2 hours if headache persists or recurs.  . tirzepatide (MOUNJARO) 5 MG/0.5ML Pen Inject 5 mg into the skin once a week.  . traMADol (ULTRAM) 50 MG tablet Take 1 tablet (50 mg total) by mouth See admin instructions. Take 50mg  twice a day. Take additional 50mg  once a day as needed for pain.  . [DISCONTINUED] clotrimazole-betamethasone (LOTRISONE) cream Apply 1 application. topically daily.  . [DISCONTINUED] cyclobenzaprine (FLEXERIL) 10 MG tablet Take 1 tablet (10 mg total) by mouth 3 (three) times daily as needed for muscle spasms.  . [DISCONTINUED] ibuprofen (ADVIL) 800 MG tablet Take 1 tablet (800 mg total) by mouth every 8 (eight) hours as needed for moderate pain.   No facility-administered  encounter medications on file as of 11/13/2021.    Past Medical History:  Diagnosis Date  . Allergy   . Anxiety   . Arthritis   . Bilateral sacroiliitis ([place])   . Chronic back pain   . Depression 2020   in therapy but not on medications  . Diabetes mellitus without complication ([place])    type 2   . Fibromyalgia   . Headache(784.0)    HX MIGRAINES  . History of kidney stones 2020   left parenchymal stone identified on CT 11/23/18   kidney cyst  . Hypercholesterolemia   . Hypertension   . Lumbar spondylosis   . Lumbosacral neuritis   . Myofascial pain   . Paresthesia of lower extremity   . Sleep apnea    HAS NOT USED C PAP SINCE 2007 DUE TO WT LOSS   . Trochanteric bursitis    left hip    Past Surgical History:  Procedure Laterality Date  . ABDOMINAL HYSTERECTOMY  1997  . BUNIONECTOMY  2012  . CESAREAN SECTION    . GASTRIC BYPASS  2006  . JOINT REPLACEMENT     RTKA 7'15  . ROBOTIC ASSISTED LAPAROSCOPIC LYSIS OF ADHESION N/A 04/19/2019   Procedure: XI ROBOTIC ASSISTED LAPAROSCOPIC LYSIS OF ADHESION;  Surgeon: Lafonda Mosses, MD;  Location: [facility];  Service: Gynecology;  Laterality: N/A;  . ROBOTIC ASSISTED SALPINGO OOPHERECTOMY Left 04/19/2019   Procedure: XI ROBOTIC ASSISTED SALPINGO OOPHORECTOMY;  Surgeon: Carver Fila, MD;  Location: [facility];  Service: Gynecology;  Laterality: Left;  . 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 TOTAL KNEE ARTHROPLASTY Left 04/13/2014   Procedure: LEFT TOTAL KNEE ARTHROPLASTY;  Surgeon: Ranee Gosselin, MD;  Location: [facility];  Service: Orthopedics;  Laterality: Left;  Marland Kitchen VENTRAL HERNIA REPAIR N/A 01/11/2019   Procedure: LAPAROSCOPIC VENTRAL HERNIA REPAIR WITH MESH;  Surgeon: Diamantina Monks, MD;  Location: [facility];  Service: General;  Laterality: N/A;    Family History  Problem Relation Age of  Onset  . Diabetes Mellitus II Mother   . Lupus Mother   . Arthritis Mother   . Diabetes Mother   . Hypertension Mother   . Miscarriages / [place] Mother   . Lung cancer Father        smoker/worked in factory  . Alcohol abuse Father   . Cancer Father        smoker, worked in [employer]  . Early death Father   . Hypertension Father   . Diabetes Mellitus II Sister   . Arthritis Sister   . Diabetes Sister   . Hypertension Sister   . Obesity Sister   . Diabetes Mellitus II Brother   . Hypercholesterolemia Brother   . Diabetes Brother   . Cancer Maternal Grandmother        Either uterine or endometrial  . Colon cancer Neg Hx   . Breast cancer Neg Hx     Social History   Socioeconomic History  . Marital status: Married    Spouse name: Lucious  . Number of children: 2  . Years of education: Not on file  . Highest education level: High school graduate  Occupational History  . Not on file  Tobacco Use  . Smoking status: Never  . Smokeless tobacco: Never  Vaping Use  . Vaping Use: Never used  Substance and Sexual Activity  . Alcohol use: Not Currently    Comment: quit 1994  . Drug use: Not Currently    Types: "Crack" cocaine    Comment: quit 1994  . Sexual activity: Not Currently    Birth control/protection: Post-menopausal  Other Topics Concern  . Not on file  Social History Narrative   Lives with husband   Caffeine- 11 oz c daily   Social Determinants of Health   Financial Resource Strain: Not on file  Food Insecurity: Not on file  Transportation Needs: Not on file  Physical Activity: Not on file  Stress: Not on file  Social Connections: Not on file  Intimate Partner Violence: Not on file    Objective    BP 114/84   Pulse 82   Temp 97.6 F (36.4 C) (Oral)   Ht 5\' 1"  (1.549 m)   Wt 234 lb 4.8 oz (106.3 kg)   SpO2 100%   BMI 44.27 kg/m   Physical Exam  60  Assessment & Plan:   Problem List Items Addressed This Visit   None   No  follow-ups on file.   Hykeem Ojeda J De , MD

## 2021-11-13 NOTE — Patient Instructions (Signed)
  Medication Instructions:  Your physician recommends that you continue on your current medications as directed. Please refer to the Current Medication list given to you today. --If you need a refill on any your medications before your next appointment, please call your pharmacy first. If no refills are authorized on file call the office.-- Lab Work: Your physician has recommended that you have lab work today: Yes If you have labs (blood work) drawn today and your tests are completely normal, you will receive your results via [place] a phone call from our staff.  Please ensure you check your voicemail in the event that you authorized detailed messages to be left on a delegated number. If you have any lab test that is abnormal or we need to change your treatment, we will call you to review the results.  Referrals/Procedures/Imaging: No  Follow-Up: Your next appointment:   Your physician recommends that you schedule a follow-up appointment in: 6 weeks for DM, HTN with Dr. de [place]  You will receive a text message or e-mail with a link to a survey about your care and experience with [place] today! We would greatly appreciate your feedback!   Thanks for letting us be apart of your health journey!!  Primary Care and Sports Medicine   Dr. Arlina Robes [place]   We encourage you to activate your patient portal called "MyChart".  Sign up information is provided on this After Visit Summary.  MyChart is used to connect with patients for Virtual Visits (Telemedicine).  Patients are able to view lab/test results, encounter notes, upcoming appointments, etc.  Non-urgent messages can be sent to your provider as well. To learn more about what you can do with MyChart, please visit --  [email or web address].

## 2021-11-15 ENCOUNTER — Encounter (HOSPITAL_BASED_OUTPATIENT_CLINIC_OR_DEPARTMENT_OTHER): Payer: Self-pay | Admitting: Family Medicine

## 2021-11-15 DIAGNOSIS — I10 Essential (primary) hypertension: Secondary | ICD-10-CM

## 2021-11-15 DIAGNOSIS — F320 Major depressive disorder, single episode, mild: Secondary | ICD-10-CM | POA: Insufficient documentation

## 2021-11-15 DIAGNOSIS — M797 Fibromyalgia: Secondary | ICD-10-CM

## 2021-11-15 NOTE — Assessment & Plan Note (Signed)
Most recent hemoglobin A1c has been at goal, is due for recheck, we will check today For now, we can continue with current dose of Mounjaro.  Could consider dose titration if A1c has increased as this could also assist with weight loss efforts Otherwise, patient is up-to-date with nephropathy screening, neuro retinopathy screening

## 2021-11-15 NOTE — Assessment & Plan Note (Signed)
Related to underlying fibromyalgia as well as chronic pain, patient was seen pain management provider in the past and does have a specific provider/office that she would like to establish with if possible which is reasonable, we have placed referral today

## 2021-11-15 NOTE — Assessment & Plan Note (Signed)
Blood pressure borderline in office today, diastolic reading is slightly above goal.  At this time, we will continue with current medication regimen and plan for close monitoring.  Recommend intermittent monitoring at home, DASH diet

## 2021-11-15 NOTE — Assessment & Plan Note (Signed)
Due to possible consideration for adjustments in medication regimen, we will plan for referral to psychiatry for further guidance on potential changes to pharmacotherapy, referral placed today

## 2021-11-16 ENCOUNTER — Other Ambulatory Visit (HOSPITAL_BASED_OUTPATIENT_CLINIC_OR_DEPARTMENT_OTHER): Payer: Self-pay

## 2021-11-16 DIAGNOSIS — M797 Fibromyalgia: Secondary | ICD-10-CM

## 2021-11-16 DIAGNOSIS — I10 Essential (primary) hypertension: Secondary | ICD-10-CM

## 2021-11-20 ENCOUNTER — Other Ambulatory Visit (HOSPITAL_BASED_OUTPATIENT_CLINIC_OR_DEPARTMENT_OTHER): Payer: Self-pay

## 2021-11-20 MED ORDER — ATORVASTATIN CALCIUM 20 MG PO TABS: 20.0000 mg | ORAL_TABLET | Freq: Every day | ORAL | 1 refills | Status: DC

## 2021-11-20 MED ORDER — AMLODIPINE BESYLATE 5 MG PO TABS: 5.0000 mg | ORAL_TABLET | Freq: Every day | ORAL | 1 refills | Status: DC

## 2021-11-20 MED ORDER — DULOXETINE HCL 60 MG PO CPEP: 60.0000 mg | ORAL_CAPSULE | Freq: Every day | ORAL | 1 refills | Status: DC

## 2021-11-20 MED ORDER — GABAPENTIN 600 MG PO TABS: 600.0000 mg | ORAL_TABLET | Freq: Every day | ORAL | 1 refills | Status: DC

## 2021-11-20 MED ORDER — HYDROCHLOROTHIAZIDE 25 MG PO TABS: 25.0000 mg | ORAL_TABLET | Freq: Every morning | ORAL | 1 refills | Status: DC

## 2021-11-21 ENCOUNTER — Encounter: Payer: Self-pay | Admitting: Vascular Surgery

## 2021-11-21 ENCOUNTER — Ambulatory Visit: Payer: Managed Care, Other (non HMO) | Admitting: Vascular Surgery

## 2021-11-21 VITALS — BP 95/62 | HR 78 | Temp 97.6°F | Resp 18 | Ht 61.0 in | Wt 231.7 lb

## 2021-11-21 DIAGNOSIS — I83813 Varicose veins of bilateral lower extremities with pain: Secondary | ICD-10-CM | POA: Diagnosis not present

## 2021-11-21 MED ORDER — GABAPENTIN 600 MG PO TABS: 600.0000 mg | ORAL_TABLET | Freq: Three times a day (TID) | ORAL | 0 refills | Status: DC

## 2021-11-21 NOTE — Progress Notes (Signed)
ASSESSMENT & PLAN   CHRONIC VENOUS INSUFFICIENCY: This patient has CEAP C1 venous disease.  We have discussed the importance of intermittent leg elevation and the proper positioning for this.  I have encouraged her to use a knee-high compression stocking with a gradient of 15 to 20 mmHg.  She should wear these when she is working as she sits and stands a lot at work.  I encouraged her to avoid prolonged sitting and standing and try to get up and walk every hour.  We discussed importance of exercise specifically walking and water aerobics.  We also discussed the importance of maintaining a healthy weight as central obesity especially increases lower extremity venous pressure.  If her varicosities increase in size or she develops worsening symptoms she can have formal venous reflux testing.  She will call if her symptoms progress.  REASON FOR CONSULT:    Varicose veins bilaterally.  The consult is requested by Dr. Abbe Amsterdam.  HPI:   Kelsey Simmons is a 61 y.o. female who is referred with varicose veins.  I have reviewed the records from the referring office.  The patient was seen on 10/15/2021.  The patient has a history of obstructive sleep apnea, chronic back pain, and migraines.  In addition she had bilateral varicose veins which were symptomatic.  The patient noted the gradual onset of some small varicose veins in both lower extremities but more significantly on the left side.  This occurred over the last 2 years.  She describes aching pain in her lower legs which is aggravated by sitting and standing and relieved with elevation.  She works at home and sits for long hours.  Recently she got a standing desk so she tries to alternate sitting and standing.  She also tries to walk around a little bit every hour.  She also describes pain in her lower back which radiates to her hips and lateral thighs which is likely related to degenerative disc disease of her back.  She denies any previous  history of DVT or phlebitis.  She has not been wearing compression stockings.  She does elevate her legs.  Past Medical History:  Diagnosis Date   Allergy    Anxiety    Arthritis    Bilateral sacroiliitis (HCC)    Chronic back pain    Depression 2020   in therapy but not on medications   Diabetes mellitus without complication (HCC)    type 2    Fibromyalgia    Headache(784.0)    HX MIGRAINES   History of kidney stones 2020   left parenchymal stone identified on CT 11/23/18   kidney cyst   Hypercholesterolemia    Hypertension    Lumbar spondylosis    Lumbosacral neuritis    Myofascial pain    Paresthesia of lower extremity    Sleep apnea    HAS NOT USED C PAP SINCE 2007 DUE TO WT LOSS    Trochanteric bursitis    left hip    Family History  Problem Relation Age of Onset   Diabetes Mellitus II Mother    Lupus Mother    Arthritis Mother    Diabetes Mother    Hypertension Mother    Miscarriages / [place] Mother    Lung cancer Father        smoker/worked in factory   Alcohol abuse Father    Cancer Father        smoker, worked in Marine scientist   Early death Father  Hypertension Father    Diabetes Mellitus II Sister    Arthritis Sister    Diabetes Sister    Hypertension Sister    Obesity Sister    Diabetes Mellitus II Brother    Hypercholesterolemia Brother    Diabetes Brother    Cancer Maternal Grandmother        Either uterine or endometrial   Colon cancer Neg Hx    Breast cancer Neg Hx     SOCIAL HISTORY: Social History   Tobacco Use   Smoking status: Never   Smokeless tobacco: Never  Substance Use Topics   Alcohol use: Not Currently    Comment: quit 1994    Allergies  Allergen Reactions   Topiramate     Other reaction(s): double vision   Chlorhexidine Gluconate Rash   Tape Rash    DERMABOND    Current Outpatient Medications  Medication Sig Dispense Refill   amLODipine (NORVASC) 5 MG tablet Take 1 tablet (5 mg total) by mouth daily. 90  tablet 1   atorvastatin (LIPITOR) 20 MG tablet Take 1 tablet (20 mg total) by mouth daily. 90 tablet 1   diclofenac Sodium (VOLTAREN) 1 % GEL Apply topically 4 (four) times daily.     DULoxetine (CYMBALTA) 60 MG capsule Take 1 capsule (60 mg total) by mouth daily. 90 capsule 1   fluticasone (FLONASE) 50 MCG/ACT nasal spray Place 2 sprays into both nostrils daily. 16 g 6   gabapentin (NEURONTIN) 600 MG tablet Take 1 tablet (600 mg total) by mouth 3 (three) times daily. 270 tablet 0   hydrochlorothiazide (HYDRODIURIL) 25 MG tablet Take 1 tablet (25 mg total) by mouth every morning. 90 tablet 1   SUMAtriptan (IMITREX) 100 MG tablet Take 1 tablet (100 mg total) by mouth every 2 (two) hours as needed for migraine or headache. May repeat in 2 hours if headache persists or recurs. 10 tablet 5   tirzepatide (MOUNJARO) 5 MG/0.5ML Pen Inject 5 mg into the skin once a week. 6 mL 1   traMADol (ULTRAM) 50 MG tablet Take 1 tablet (50 mg total) by mouth See admin instructions. Take 50mg  twice a day. Take additional 50mg  once a day as needed for pain. 90 tablet 0   fexofenadine (ALLEGRA) 180 MG tablet Take by mouth.     No current facility-administered medications for this visit.    REVIEW OF SYSTEMS:  [X]  denotes positive finding, [ ]  denotes negative finding Cardiac  Comments:  Chest pain or chest pressure:    Shortness of breath upon exertion:    Short of breath when lying flat:    Irregular heart rhythm:        Vascular    Pain in calf, thigh, or hip brought on by ambulation: x   Pain in feet at night that wakes you up from your sleep:  x   Blood clot in your veins:    Leg swelling:  x       Pulmonary    Oxygen at home:    Productive cough:     Wheezing:         Neurologic    Sudden weakness in arms or legs:     Sudden numbness in arms or legs:     Sudden onset of difficulty speaking or slurred speech:    Temporary loss of vision in one eye:     Problems with dizziness:          Gastrointestinal    Blood in stool:  Vomited blood:         Genitourinary    Burning when urinating:     Blood in urine:        Psychiatric    Major depression:  x       Hematologic    Bleeding problems:    Problems with blood clotting too easily:        Skin    Rashes or ulcers: x       Constitutional    Fever or chills:    -  PHYSICAL EXAM:   Vitals:   11/21/21 1127  BP: 95/62  Pulse: 78  Resp: 18  Temp: 97.6 F (36.4 C)  TempSrc: Temporal  SpO2: 98%  Weight: 231 lb 11.2 oz (105.1 kg)  Height: 5\' 1"  (1.549 m)   Body mass index is 43.78 kg/m. GENERAL: The patient is a well-nourished female, in no acute distress. The vital signs are documented above. CARDIAC: There is a regular rate and rhythm.  VASCULAR: I do not detect carotid bruits. She has palpable dorsalis pedis pulses bilaterally. She has no significant leg swelling or hyperpigmentation. She has some varicose veins on her anterior left leg and some telangiectasias in her leg and thigh on the left.     PULMONARY: There is good air exchange bilaterally without wheezing or rales. ABDOMEN: Soft and non-tender with normal pitched bowel sounds.  MUSCULOSKELETAL: There are no major deformities. NEUROLOGIC: No focal weakness or paresthesias are detected. SKIN: There are no ulcers or rashes noted. PSYCHIATRIC: The patient has a normal affect.  DATA:    This patient did not have any formal venous studies.  Deitra [facility]

## 2021-11-27 ENCOUNTER — Ambulatory Visit: Payer: Managed Care, Other (non HMO) | Admitting: Registered Nurse

## 2021-11-28 ENCOUNTER — Ambulatory Visit (HOSPITAL_BASED_OUTPATIENT_CLINIC_OR_DEPARTMENT_OTHER): Payer: Managed Care, Other (non HMO) | Admitting: Family Medicine

## 2021-11-30 ENCOUNTER — Encounter: Payer: Self-pay | Admitting: Physical Medicine and Rehabilitation

## 2021-12-10 ENCOUNTER — Encounter: Payer: Self-pay | Admitting: Physical Medicine and Rehabilitation

## 2021-12-10 ENCOUNTER — Encounter
Payer: Managed Care, Other (non HMO) | Attending: Physical Medicine and Rehabilitation | Admitting: Physical Medicine and Rehabilitation

## 2021-12-10 VITALS — BP 115/70 | HR 91 | Ht 61.0 in | Wt 232.8 lb

## 2021-12-10 DIAGNOSIS — Z79891 Long term (current) use of opiate analgesic: Secondary | ICD-10-CM | POA: Diagnosis present

## 2021-12-10 DIAGNOSIS — M5440 Lumbago with sciatica, unspecified side: Secondary | ICD-10-CM | POA: Diagnosis not present

## 2021-12-10 DIAGNOSIS — M792 Neuralgia and neuritis, unspecified: Secondary | ICD-10-CM | POA: Diagnosis not present

## 2021-12-10 DIAGNOSIS — M47816 Spondylosis without myelopathy or radiculopathy, lumbar region: Secondary | ICD-10-CM | POA: Diagnosis present

## 2021-12-10 DIAGNOSIS — M797 Fibromyalgia: Secondary | ICD-10-CM | POA: Diagnosis not present

## 2021-12-10 DIAGNOSIS — Z5181 Encounter for therapeutic drug level monitoring: Secondary | ICD-10-CM | POA: Insufficient documentation

## 2021-12-10 DIAGNOSIS — G8929 Other chronic pain: Secondary | ICD-10-CM | POA: Insufficient documentation

## 2021-12-10 DIAGNOSIS — G894 Chronic pain syndrome: Secondary | ICD-10-CM | POA: Insufficient documentation

## 2021-12-10 NOTE — Progress Notes (Signed)
Subjective:    Patient ID: Kelsey Simmons, female    DOB: [birth year], 61 y.o.   MRN: [id number]  HPI Kelsey Simmons is a 61 y.o. year old female  who  has a past medical history of Allergy, Anxiety, Arthritis, Bilateral sacroiliitis (HCC), Chronic back pain, Depression (2020), Diabetes mellitus without complication (HCC), Fibromyalgia, Headache(784.0), History of kidney stones (2020), Hypercholesterolemia, Hypertension, Lumbar spondylosis, Lumbosacral neuritis, Myofascial pain, Paresthesia of lower extremity, Sleep apnea, and Trochanteric bursitis.   They are presenting to PM&R clinic as a new patient for pain management evaluation. They were referred by Dr. De Peru for treatment of chronic low back pain and fibromalgia.   Per his note:  "She does have a history of chronic pain for which she primarily utilizes tramadol to help with controlling symptoms.  Symptoms primarily related to old injury which was primarily being addressed by insurance and she was seeing a PM&R specialist locally but since her case was closed, she was no longer able to see the provider.  She is interested in establishing with new pain management specialist locally, did identify a specific provider/office that she would like referral to if possible." Patient states she prior saw PM&R at [facility] but that was a workers comp case, which closed.   Source: Primarily low back, R>L; extending into lateral thighs. Also pain in upper back/shoulders.  Inciting incident: Had a fall at work in 1999 where a chair rolled and she sat on the floor. She states since then, she has had pain in her low back and it has since extended into her legs.  Duration of pain: Constant,  Description of pain: Low back pain is aching pain that converts to sharp pain with prolonged standing; radiating into thighs she gets numbness/tingling/burning going into thighs.  Severity: On average 8/10. At worst 10/10. At best  5/10. Exacerbating  factors: Standing; has to use a rollator or use an electric chair in stores if she's standing more than 10 minutes. Sleeping on her stomach makes her wake up in pain.  Remitting factors: Sitting improves it somewhat but prolonged sittig will exacerbate it. Sleeping on her side improved pain. Using an airplane pillow to improve her posture, along with new chair and adjustable desk to change positions.  Red flag symptoms: Patient denies saddle anesthesia, loss of bowel or bladder continence, new weakness, new numbness/tingling, or pain waking up at nighttime.  Medications tried: Topical medications ( mild effect) : Has tried voltaren gel, lidocaine patches in the past with minimal benefit due to "too little coverage".  Nsaids ( minimal effect): Takes aleve or ibuprofen PRN daily Tylenol  ( minimal effect): Takes tylenol PRN daily Opiates  ( good effect): Currently on Tramadol 50 mg #90 tabs per month last filled 10/26 by Dr. De [place]. She consistently needs 3 per day and notes no side effects other than mild tiredness, which doesn't bother her too much.   Gabapentin / Lyrica  ( good effect): On gabapentin 600 mg TID, no side effects. She states she was on Lyrica first and this caused a lot of swelling.   TCAs: Never tried SNRIs  ( no effect): On Cymbalta 60 mg once per day; no effect on pain, but if she misses it she gets "brain zaps"/headaches  Other  (good effect): Has tried toradol in the past with sciatica, worked well.   Other treatments: PT/OT  ( no effect): Has been in PT for her low back >5 years prior; did not  get any relief in her legs, but may have gotten some in her low back.  Accupuncture/chiropractor/massage  ( no effect): Did dry needling in the past without benefit >24 hours.  TENs unit ( good effect): Uses at home intermittently, lasts a day and then goes away.  Injections ( good effect): Had trigger point injections, facet injections, and RFA in low back with Dr. Abigail MiyamotoBarco, with good  benefit. She said some provided a few months of relief, others none. She states the RFA lasted longest, 6-9 months on average.  Surgery : December 2001 had a hiatal hernia repair surgery, and has been told some of her issues may be referred from that.  Never had surgery on her back.  Other  ( good effect): Heat/Ice "works the best"; icing her neck or low back helps most during the day.   Goals for pain control: Walking longer periods, do physical exercise to lose weight. Currently very sedentary[facility] and sits in recliner. Her husband is also requiring more assistance, and she is having increased worrying about [place] in [place].   Of note, just re-started therapy Q2Weeks for depression. She feels the cymbalta does not control her symptoms very well.   Pain Inventory Average Pain 8 Pain Right Now 5 My pain is burning, stabbing, tingling, and aching  In the last 24 hours, has pain interfered with the following? General activity 8 Relation with others 8 Enjoyment of life 8 What TIME of day is your pain at its worst? daytime and evening Sleep (in general) Fair  Pain is worse with: walking, sitting, standing, and some activites Pain improves with: rest, heat/ice, medication, TENS, and injections Relief from Meds: 8  walk with assistance use a walker how many minutes can you walk? 3 ability to climb steps?  yes do you drive?  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  no  Any changes since last visit?  no    Family History  Problem Relation Age of Onset   Diabetes Mellitus II Mother    Lupus Mother    Arthritis Mother    Diabetes Mother    Hypertension Mother    Miscarriages / [place] Mother    Lung cancer Father        smoker/worked in factory   Alcohol abuse Father     Cancer Father        smoker, worked in Marine scientistfactory   Early death Father    Hypertension Father    Diabetes Mellitus II Sister    Arthritis Sister    Diabetes Sister    Hypertension Sister    Obesity Sister    Diabetes Mellitus II Brother    Hypercholesterolemia Brother    Diabetes Brother    Cancer Maternal Grandmother        Either uterine or endometrial   Colon cancer Neg Hx    Breast cancer Neg Hx    Social History   Socioeconomic History   Marital status: Married    Spouse name: Lucious   Number of children: 2   Years of education: Not on file   Highest education level: High school graduate  Occupational History   Not on file  Tobacco Use   Smoking status: Never   Smokeless tobacco: Never  Vaping Use   Vaping Use: Never used  Substance and Sexual Activity   Alcohol use: Not Currently    Comment: quit 1994   Drug use: Not Currently    Types: "Crack" cocaine    Comment: quit 1994   Sexual activity: Not Currently    Birth control/protection: Post-menopausal  Other Topics Concern   Not on file  Social History Narrative   Lives with husband   Caffeine- 11 oz c daily   Social Determinants of Health   Financial Resource Strain: Not on file  Food Insecurity: Not on file  Transportation Needs: Not on file  Physical Activity: Not on file  Stress: Not on file  Social Connections: Not on file   Past Surgical History:  Procedure Laterality Date   ABDOMINAL HYSTERECTOMY  1997   BUNIONECTOMY  2012   CESAREAN SECTION     GASTRIC BYPASS  2006   JOINT REPLACEMENT     RTKA 7'15   ROBOTIC ASSISTED LAPAROSCOPIC LYSIS OF ADHESION N/A 04/19/2019   Procedure: XI ROBOTIC ASSISTED LAPAROSCOPIC LYSIS OF ADHESION;  Surgeon: Carver Fila, MD;  Location: [facility];  Service: Gynecology;  Laterality: N/A;   ROBOTIC ASSISTED SALPINGO OOPHERECTOMY Left 04/19/2019   Procedure: XI ROBOTIC ASSISTED SALPINGO OOPHORECTOMY;  Surgeon: Carver Fila, MD;  Location: [facility];  Service:  Gynecology;  Laterality: Left;   TOTAL KNEE ARTHROPLASTY Right 08/18/2013   Procedure: RIGHT TOTAL KNEE ARTHROPLASTY;  Surgeon: Jacki Cones, MD;  Location: [facility];  Service: Orthopedics;  Laterality: Right;   TOTAL KNEE ARTHROPLASTY Left 04/13/2014   Procedure: LEFT TOTAL KNEE ARTHROPLASTY;  Surgeon: Ranee Gosselin, MD;  Location: [facility];  Service: Orthopedics;  Laterality: Left;   VENTRAL HERNIA REPAIR N/A 01/11/2019   Procedure: LAPAROSCOPIC VENTRAL HERNIA REPAIR WITH MESH;  Surgeon: Diamantina Monks, MD;  Location: [facility];  Service: General;  Laterality: N/A;   Past Medical History:  Diagnosis Date   Allergy    Anxiety    Arthritis    Bilateral sacroiliitis (HCC)    Chronic back pain    Depression 2020   in therapy but not on medications   Diabetes mellitus without complication (HCC)    type 2    Fibromyalgia    Headache(784.0)    HX MIGRAINES   History of kidney stones 2020   left parenchymal stone identified on CT 11/23/18   kidney cyst   Hypercholesterolemia    Hypertension    Lumbar spondylosis    Lumbosacral neuritis    Myofascial pain    Paresthesia of lower extremity    Sleep apnea    HAS NOT USED C PAP SINCE 2007 DUE TO WT LOSS    Trochanteric bursitis    left hip   BP 115/70   Pulse 91   Ht 5\' 1"  (1.549 m)   Wt 232 lb 12.8 oz (105.6 kg)   SpO2 98%   BMI 43.99 kg/m   Opioid Risk Score:   Fall Risk Score:  `1  Depression screen PHQ 2/9     12/10/2021    1:16 PM 11/13/2021    1:19 PM 10/15/2021    3:48 PM 05/29/2021    9:40 AM 04/12/2021    9:58 AM 03/19/2018    8:27 AM  Depression screen PHQ 2/9  Decreased Interest 2 3 1 1 1 1   Down, Depressed, Hopeless 1 3 1 1 1 1   PHQ - 2 Score 3 6 2 2 2 2   Altered sleeping 1 3 0 1 1 1   Tired, decreased  energy 1 3 1 1 1 1   Change in appetite 2 3 2  0 1 2  Feeling bad or failure about yourself  1 1 0 0 1 1  Trouble concentrating 1 1 0 0 1 1  Moving slowly or fidgety/restless 0 0 0 0 0 0  Suicidal thoughts 0 0 0  0 0 0  PHQ-9 Score 9 17 5 4 7 8   Difficult doing work/chores Very difficult Very difficult Somewhat difficult Not difficult at all Somewhat difficult Somewhat difficult     Review of Systems  Constitutional: Negative.   HENT: Negative.    Eyes: Negative.   Respiratory: Negative.    Cardiovascular:  Positive for leg swelling.  Gastrointestinal: Negative.   Endocrine: Negative.   Genitourinary:        Bladder control  Musculoskeletal:  Positive for back pain.  Skin: Negative.   Allergic/Immunologic: Negative.   Neurological:  Positive for numbness.  Hematological: Negative.   Psychiatric/Behavioral:  Positive for dysphoric mood. The patient is nervous/anxious.   All other systems reviewed and are negative.      Objective:   Physical Exam  Constitution: Appropriate appearance for age. No apparnet distress +Obese HEENT: PERRL, EOMI grossly intact.  Resp: CTAB. No rales, rhonchi, or wheezing. Cardio: RRR. No mumurs, rubs, or gallops. Nonpitting 1+ bilateral edema. Abdomen: Nondistended. Nontender. +bowel sounds. Psych: Appropriate mood and affect. Neuro: AAOx4. No apparent deficits   Neurologic Exam:   DTRs: Reflexes were 2+ in bilateral lower extremities. Babinsky: flexor responses b/l.   Sensory exam: revealed decreased sensation to light touch in bilateral lateral knees and thighs, not crossing the knee or extending into the lower legs.  Motor exam: strength normal in all myotomal regions of bilateral knees and ankles; hip flexion bilaterally 4/5 and limited by back pain. Coordination: Fine motor coordination was normal.   Gait: Gait was normal.  Back Exam:   Inspection: Pelvis was even.  Lumbar lordotic curvature was increased.   Palpation: Palpatory exam revealed ttp at the bilateral lumbar paraspinals, bilateral PSIS, and bilateral lateral thighs, no ttp bilateral SI joints or ischial bursa. There was no evidence of spasm. No trigger points were noted.    ROM:  ROM  revealed restricted AROM in flexion and extension, and rotation. Special/provocative testing:    SLR: negative   Slump test: negative    Facet loading: positive bilateral local back pain (very non-specific)   TTP at paraspinals: positive bilaterally (sensitive for facet pain...if no ttp then likely not facet pain)   Information in () parenthesis is normals/details of specific exam.     Assessment & Plan:   Kelsey Simmons is a 61 y.o. year old female  who  has a past medical history of Allergy, Anxiety, Arthritis, Bilateral sacroiliitis (HCC), Chronic back pain, Depression (2020), Diabetes mellitus without complication (HCC), Fibromyalgia, Headache(784.0), History of kidney stones (2020), Hypercholesterolemia, Hypertension, Lumbar spondylosis, Lumbosacral neuritis, Myofascial pain, Paresthesia of lower extremity, Sleep apnea, and Trochanteric bursitis.   They are presenting to PM&R clinic as a new patient for pain management evaluation. They were referred by Dr. De 46 for treatment of chronic low back pain and fibromylgia pain.  Chronic pain syndrome Assessment & Plan: Indication for chronic opioid: Chronic low back pain with sciatica Medication and dose: Tramadol 50 mg TID PRN  # pills per month: 90 tabs Last UDS date: 12/10/21 Opioid Treatment Agreement signed (Y/N): Y, 12/10/21 Opioid Treatment Agreement last reviewed with patient:   NCCSRS/PDMP reviewed this encounter (  include red flags): Yes   Management will include: Low Risk (<10 MME) UDS every 6-12 months NCCSR check every visit Follow up Q3M initially, Q6M once established   Orders: -     ToxAssure Select,+Antidepr,UR  Fibromyalgia Assessment & Plan: Today, you signed a pain contract and performed a urine drug screen. If the results of that are as expected, we will refill Tramadol 50 mg three times daily as needed #90 tabs R2 (next due 12/13/21). You will follow up with [place] in 3 months and bring your pills in with you at  that time.    Chronic bilateral low back pain with sciatica, sciatica laterality unspecified Assessment & Plan: ordered bloodwork for neuropathy and to check your kidney and liver function.   Depending on these results, will likely increase duloxetine from 60 mg daily to up-taper to 60 mg BID. Alternatively, can consider increase in gabapentin. may recommend other supplements based on results. I will call you to discuss this.    Neuropathic pain -     Comprehensive metabolic panel -     B12 and Folate Panel -     TSH -     T4, free -     Magnesium  Lumbar facet arthropathy Assessment & Plan: Patient endorses Hx best results in pain control with RFA in her low back, unsure which levels. Provided 6-9 months of relief.   Please get records from your last doctor regarding and epidural steroid or ablation procedures.   I will refer you to Dr. Doroteo Bradford for medial branch blocks/ablations in the interim.   MRI lumbar spine 08/27/17 showing multilevel facet arthropathy.   Encounter for therapeutic drug monitoring -     ToxAssure Select,+Antidepr,UR  Encounter for long-term opiate analgesic use -     ToxAssure Select,+Antidepr,UR     Angelina Sheriff, DO 12/10/2021

## 2021-12-10 NOTE — Assessment & Plan Note (Signed)
Indication for chronic opioid: Chronic low back pain with sciatica Medication and dose: Tramadol 50 mg TID PRN  # pills per month: 90 tabs Last UDS date: 12/10/21 Opioid Treatment Agreement signed (Y/N): Y, 12/10/21 Opioid Treatment Agreement last reviewed with patient:   NCCSRS/PDMP reviewed this encounter (include red flags): Yes    Management will include: Low Risk (<10 MME) UDS every 6-12 months NCCSR check every visit Follow up Q3M initially, Q6M once established  

## 2021-12-10 NOTE — Assessment & Plan Note (Signed)
Patient endorses Hx best results in pain control with RFA in her low back, unsure which levels. Provided 6-9 months of relief.   Please get records from your last doctor regarding and epidural steroid or ablation procedures.   I will refer you to Dr. Doroteo Bradford for medial branch blocks/ablations in the interim.   MRI lumbar spine 08/27/17 showing multilevel facet arthropathy.

## 2021-12-10 NOTE — Patient Instructions (Signed)
Today, you signed a pain contract and performed a urine drug screen. If the results of that are as expected, we will refill Tramadol 50 mg three times daily as needed #90 tabs R2. You will follow up with [place] in 3 months and bring your pills in with you at that time.   Today, I also ordered bloodwork for neuropathy and to check your kidney and liver function. Depending on these results, we will either increase your duloxetine or gabapentin, and may recommend other supplements. I will call you to discuss this.  Please get records from your last doctor regarding and epidural steroid or ablation procedures. I will refer you to Dr. Doroteo Bradford for medial branch blocks/ablations in the interim.

## 2021-12-10 NOTE — Assessment & Plan Note (Addendum)
Today, you signed a pain contract and performed a urine drug screen. If the results of that are as expected, we will refill Tramadol 50 mg three times daily as needed #90 tabs R2 (next due 12/13/21). You will follow up with [place] in 3 months and bring your pills in with you at that time.

## 2021-12-10 NOTE — Assessment & Plan Note (Signed)
ordered bloodwork for neuropathy and to check your kidney and liver function.   Depending on these results, will likely increase duloxetine from 60 mg daily to up-taper to 60 mg BID. Alternatively, can consider increase in gabapentin. may recommend other supplements based on results. I will call you to discuss this.

## 2021-12-11 ENCOUNTER — Telehealth: Payer: Self-pay | Admitting: Physical Medicine and Rehabilitation

## 2021-12-11 ENCOUNTER — Ambulatory Visit (HOSPITAL_BASED_OUTPATIENT_CLINIC_OR_DEPARTMENT_OTHER): Payer: Managed Care, Other (non HMO) | Admitting: Family Medicine

## 2021-12-11 DIAGNOSIS — M792 Neuralgia and neuritis, unspecified: Secondary | ICD-10-CM

## 2021-12-11 DIAGNOSIS — M797 Fibromyalgia: Secondary | ICD-10-CM

## 2021-12-11 LAB — COMPREHENSIVE METABOLIC PANEL
ALT: 27 IU/L (ref 0–32)
AST: 27 IU/L (ref 0–40)
Albumin/Globulin Ratio: 1.4 (ref 1.2–2.2)
Albumin: 4.6 g/dL (ref 3.8–4.9)
Alkaline Phosphatase: 95 IU/L (ref 44–121)
BUN/Creatinine Ratio: 18 (ref 9–23)
BUN: 16 mg/dL (ref 6–24)
Bilirubin Total: <0.2 mg/dL (ref 0.0–1.2)
CO2: 26 mmol/L (ref 20–29)
Calcium: 10.1 mg/dL (ref 8.7–10.2)
Chloride: 99 mmol/L (ref 96–106)
Creatinine, Ser: 0.91 mg/dL (ref 0.57–1.00)
Globulin, Total: 3.2 g/dL (ref 1.5–4.5)
Glucose: 104 mg/dL — ABNORMAL HIGH (ref 70–99)
Potassium: 4.4 mmol/L (ref 3.5–5.2)
Sodium: 142 mmol/L (ref 134–144)
Total Protein: 7.8 g/dL (ref 6.0–8.5)
eGFR: 73 mL/min/{1.73_m2} (ref >59)

## 2021-12-11 LAB — B12 AND FOLATE PANEL
Folate: 13.9 ng/mL (ref >3.0)
Vitamin B-12: 640 pg/mL (ref 232–1245)

## 2021-12-11 LAB — T4, FREE: Free T4: 1.15 ng/dL (ref 0.82–1.77)

## 2021-12-11 LAB — TSH: TSH: 2.3 u[IU]/mL (ref 0.450–4.500)

## 2021-12-11 LAB — MAGNESIUM: Magnesium: 2.3 mg/dL (ref 1.6–2.3)

## 2021-12-11 MED ORDER — DULOXETINE HCL 30 MG PO CPEP: ORAL_CAPSULE | ORAL | 0 refills | Status: DC

## 2021-12-11 NOTE — Telephone Encounter (Signed)
Reviewed lab results and found no critical abnormalities; renal and liver functions WNL, Mag/thyroid studies/B12/folate WNL.  Called patient to discuss these, and possible medication changes. Options include increasing Duloxetine 120 mg daily dosing (90 mg  initially, can uptitrate to 120 mg after 2 weeks), increasing gabapentin from 600 mg tid to 800-900 mg tid, or adding elavil 12.5 mg QHS.   Patient is tolerating Duloxetine well, feel risk of serotonin syndrome with concurrent Tramadol is minimal and would provide more benefit than other potential options. Did discuss with patient concurrent use of tramadol with imitrex, and she endorses she only needs her triptan once every 3 months or so and can safely hold tramadol if it is used.   Plan:   - Script sent to [facility] for Duloxetine 30 mg capsules #45 R0 with following instructions:  In addition to current 60 mg capsule taken at nighttime, take one 30 mg capsule (total 90 mg) at nighttime for 2 weeks. If tolerating well, increase to one 60 mg capsule and two 30 mg capsules (total 120 mg) at nighttime. If tolerating well, after two weeks we will convert future scripts to 60 mg capsules only, and you will take two at nighttime (120 mg total). If any questions, call your prescriber's office.   - Patient will call or message in 2 weeks to discuss medication tolerance. If doing well, will send script for Duloxetine 60 mg capsule 2 tablets QHS #60 tablets R5 starting 01/08/22.   - Advised patient to monitor for s/s serotonin syndrome and not to take tramadol and sumatriptan concurrently due to this risk.   - Of note, patient offhandedly comments that she "does not want to relapse" in regards to obtaining pain medication from the ER or other physicians. Note no documented history of substance abuse or endorsement of this history on initial exam; on chart review, did answer distant Hx of cocaine use on a prior medical history screen. Will await UDS results and  discuss further next visit.    Angelina Sheriff, DO 12/11/2021

## 2021-12-13 ENCOUNTER — Encounter: Payer: Self-pay | Admitting: Physical Medicine and Rehabilitation

## 2021-12-13 LAB — TOXASSURE SELECT,+ANTIDEPR,UR

## 2021-12-13 MED ORDER — TRAMADOL HCL 50 MG PO TABS: 50.0000 mg | ORAL_TABLET | ORAL | 0 refills | Status: DC

## 2021-12-13 MED ORDER — TRAMADOL HCL 50 MG PO TABS: 50.0000 mg | ORAL_TABLET | ORAL | 1 refills | Status: DC

## 2021-12-13 NOTE — Addendum Note (Signed)
Addended by: Elijah Birk on: 12/13/2021 12:11 PM   Modules accepted: Orders

## 2021-12-13 NOTE — Progress Notes (Signed)
UDS as expected; Tramadol 50 mg three times daily as needed #90 tabs R2 sent to patient's pharmacy ([facility] due to medication being due today 11/23; subsequent 2 refills sent through [facility] starting 12/21). Patient informed with MyChart messaging.

## 2021-12-25 ENCOUNTER — Ambulatory Visit (HOSPITAL_BASED_OUTPATIENT_CLINIC_OR_DEPARTMENT_OTHER): Payer: Managed Care, Other (non HMO) | Admitting: Family Medicine

## 2021-12-27 ENCOUNTER — Ambulatory Visit (HOSPITAL_COMMUNITY)
Admission: EM | Admit: 2021-12-27 | Discharge: 2021-12-27 | Disposition: A | Discharge status: Home / Self Care | Payer: Managed Care, Other (non HMO) | Attending: Internal Medicine | Admitting: Internal Medicine

## 2021-12-27 ENCOUNTER — Encounter (HOSPITAL_COMMUNITY): Payer: Self-pay

## 2021-12-27 DIAGNOSIS — R051 Acute cough: Secondary | ICD-10-CM | POA: Diagnosis not present

## 2021-12-27 DIAGNOSIS — M5431 Sciatica, right side: Secondary | ICD-10-CM

## 2021-12-27 MED ORDER — METHYLPREDNISOLONE SODIUM SUCC 125 MG IJ SOLR
INTRAMUSCULAR | Status: AC
  Filled 2021-12-27: qty 2

## 2021-12-27 MED ORDER — METHYLPREDNISOLONE SODIUM SUCC 125 MG IJ SOLR
80.0000 mg | Freq: Once | INTRAMUSCULAR | Status: AC
  Administered 2021-12-27: 80 mg via INTRAMUSCULAR

## 2021-12-27 MED ORDER — PROMETHAZINE-DM 6.25-15 MG/5ML PO SYRP: 5.0000 mL | ORAL_SOLUTION | Freq: Every evening | ORAL | 0 refills | Status: DC | PRN

## 2021-12-27 MED ORDER — BENZONATATE 100 MG PO CAPS: 100.0000 mg | ORAL_CAPSULE | Freq: Three times a day (TID) | ORAL | 0 refills | Status: DC

## 2021-12-27 MED ORDER — PREDNISONE 20 MG PO TABS: 40.0000 mg | ORAL_TABLET | Freq: Every day | ORAL | 0 refills | Status: AC

## 2021-12-27 NOTE — ED Provider Notes (Signed)
[facility]    CSN: [id number] Arrival date & time: 12/27/21  6568      History   Chief Complaint Chief Complaint  Patient presents with   Cough   Back Pain    HPI Kelsey Simmons is a 61 y.o. female.   Patient presents to urgent care for evaluation of sciatic nerve pain that started 2 days ago on Tuesday, December 25, 2021.  She has had the same pain in the past but mostly to the left side.  Movement and walking makes pain worse to the right hip/low back.  Takes tramadol and Tylenol for pain, this has not been helping.  Sees pain management for chronic pain.  No recent steroid use, falls, injuries/trauma to the area, or numbness/tingling to the bilateral lower extremities.  Pain to the right hip/low back radiates to the knee only.  Last flare of sciatic nerve pain was in July. Pain to the right low back/hip is currently a 10 on a scale of 0-10.  History of type 2 diabetes, states this is well-controlled with current regimen.  She has been using ice and heat to the area and attempt to treat sciatic nerve pain without relief.  Would also like to be evaluated for persistent cough for approximately 1 week since December 20, 2021.  Cough is nonproductive and mostly dry, worse at nighttime.  Still having some nasal congestion from viral URI as well but sore throat has improved.  No fever, chills, nausea, vomiting, abdominal pain, ear pain, headache, blurry vision, shortness of breath, chest pain, generalized body aches or weakness, or dizziness.  Tolerating food and fluids well without difficulty.  Has not been taking any medications for symptoms over-the-counter.  She is not a smoker and denies drug use.  No history of asthma or other chronic respiratory problems.   Cough Back Pain   Past Medical History:  Diagnosis Date   Allergy    Anxiety    Arthritis    Bilateral sacroiliitis (HCC)    Chronic back pain    Depression 2020   in therapy but not on medications   Diabetes  mellitus without complication (HCC)    type 2    Fibromyalgia    Headache(784.0)    HX MIGRAINES   History of kidney stones 2020   left parenchymal stone identified on CT 11/23/18   kidney cyst   Hypercholesterolemia    Hypertension    Lumbar spondylosis    Lumbosacral neuritis    Myofascial pain    Paresthesia of lower extremity    Sleep apnea    HAS NOT USED C PAP SINCE 2007 DUE TO WT LOSS    Trochanteric bursitis    left hip    Patient Active Problem List   Diagnosis Date Noted   Chronic bilateral low back pain with sciatica 12/10/2021   Lumbar facet arthropathy 12/10/2021   Chronic pain syndrome 12/10/2021   Neuropathic pain 12/10/2021   Depression, major, single episode, mild (HCC) 11/15/2021   Rash and nonspecific skin eruption 04/13/2021   Bone spur of foot 07/31/2020   Cervical spondylitis (HCC) 03/02/2020   BMI 40.0-44.9, adult (HCC) 07/06/2019   Ovarian cyst, left 04/09/2019   Exposure to severe acute respiratory syndrome coronavirus 2 (SARS-CoV-2) 02/13/2019   Abdominal pain 02/09/2019   Acquired spondylolisthesis 02/09/2019   Allergic rhinitis 02/09/2019   Constipation 02/09/2019   Fibrocystic breast changes 02/09/2019   Fibromyalgia 02/09/2019   Gastroesophageal reflux disease without esophagitis 02/09/2019  Iron deficiency anemia 02/09/2019   Migraine with aura 02/09/2019   Pure hypercholesterolemia 02/09/2019   Bilateral sacroiliitis (HCC) 01/26/2019   Diabetes mellitus without complication (HCC) 09/22/2018   Hypertension 04/12/2015   History of total knee arthroplasty 04/13/2014   Prolapsed lumbar disc 10/14/2013   Osteoarthritis of right knee 08/18/2013   Hx of total knee arthroplasty 08/18/2013   Inflammation of sacroiliac joint (HCC) 11/09/2012    Past Surgical History:  Procedure Laterality Date   ABDOMINAL HYSTERECTOMY  1997   BUNIONECTOMY  2012   CESAREAN SECTION     GASTRIC BYPASS  2006   JOINT REPLACEMENT     RTKA 7'15   ROBOTIC  ASSISTED LAPAROSCOPIC LYSIS OF ADHESION N/A 04/19/2019   Procedure: XI ROBOTIC ASSISTED LAPAROSCOPIC LYSIS OF ADHESION;  Surgeon: Carver Filaucker, Katherine R, MD;  Location: [facility];  Service: Gynecology;  Laterality: N/A;   ROBOTIC ASSISTED SALPINGO OOPHERECTOMY Left 04/19/2019   Procedure: XI ROBOTIC ASSISTED SALPINGO OOPHORECTOMY;  Surgeon: Carver Filaucker, Katherine R, MD;  Location: [facility];  Service: Gynecology;  Laterality: Left;   TOTAL KNEE ARTHROPLASTY Right 08/18/2013   Procedure: RIGHT TOTAL KNEE ARTHROPLASTY;  Surgeon: Jacki Conesonald A Gioffre, MD;  Location: [facility];  Service: Orthopedics;  Laterality: Right;   TOTAL KNEE ARTHROPLASTY Left 04/13/2014   Procedure: LEFT TOTAL KNEE ARTHROPLASTY;  Surgeon: Ranee Gosselinonald Gioffre, MD;  Location: [facility];  Service: Orthopedics;  Laterality: Left;   VENTRAL HERNIA REPAIR N/A 01/11/2019   Procedure: LAPAROSCOPIC VENTRAL HERNIA REPAIR WITH MESH;  Surgeon: Diamantina MonksLovick, Ayesha N, MD;  Location: [facility];  Service: General;  Laterality: N/A;    OB History   No obstetric history on file.      Home Medications    Prior to Admission medications   Medication Sig Start Date End Date Taking? Authorizing Provider  benzonatate (TESSALON) 100 MG capsule Take 1 capsule (100 mg total) by mouth every 8 (eight) hours. 12/27/21  Yes Carlisle BeersStanhope, Auna Mikkelsen M, FNP  predniSONE (DELTASONE) 20 MG tablet Take 2 tablets (40 mg total) by mouth daily for 5 days. 12/27/21 01/01/22 Yes Carlisle BeersStanhope, Anju Sereno M, FNP  promethazine-dextromethorphan (PROMETHAZINE-DM) 6.25-15 MG/5ML syrup Take 5 mLs by mouth at bedtime as needed for cough. 12/27/21  Yes Carlisle BeersStanhope, [place] Rosamond M, FNP  amLODipine (NORVASC) 5 MG tablet Take 1 tablet (5 mg total) by mouth daily. 11/20/21   de [place], Buren Kosaymond J, MD  atorvastatin (LIPITOR) 20 MG tablet Take 1 tablet (20 mg total) by mouth daily. 11/20/21   de [place], Raymond J, MD  diclofenac Sodium (VOLTAREN) 1 % GEL Apply topically 4 (four) times daily.    [provider]  DULoxetine  (CYMBALTA) 30 MG capsule In addition to current 60 mg capsule taken at nighttime, take one 30 mg capsule (total 90 mg) at nighttime for 2 weeks. If tolerating well, increase to one 60 mg capsule and two 30 mg capsules (total 120 mg) at nighttime. If tolerating well, after two weeks we will convert future scripts to 60 mg capsules only, and you will take two at nighttime (120 mg total). If any questions, call your prescriber's office. 12/11/21   Angelina SheriffEngler, Morgan C, DO  fexofenadine (ALLEGRA) 180 MG tablet Take by mouth. 07/31/20 07/31/21  [provider]  fluticasone (FLONASE) 50 MCG/ACT nasal spray Place 2 sprays into both nostrils daily. 05/18/21   Janeece AgeeMorrow, Richard, NP  gabapentin (NEURONTIN) 600 MG tablet Take 1 tablet (600 mg total) by mouth 3 (three) times daily. 11/21/21   de [place], Raymond J, MD  hydrochlorothiazide (HYDRODIURIL) 25 MG tablet Take 1 tablet (25 mg total) by mouth every morning. 11/20/21   de [place], Buren Kos, MD  SUMAtriptan (IMITREX) 100 MG tablet Take 1 tablet (100 mg total) by mouth every 2 (two) hours as needed for migraine or headache. May repeat in 2 hours if headache persists or recurs. 04/12/21   Janeece Agee, NP  tirzepatide [facility]) 5 MG/0.5ML Pen Inject 5 mg into the skin once a week. 08/31/21   Shade Flood, MD  traMADol (ULTRAM) 50 MG tablet Take 1 tablet (50 mg total) by mouth See admin instructions. Take  twice a day. Take additional  once a day as needed for pain. 12/13/21   Angelina Sheriff, DO  DULoxetine (CYMBALTA) 60 MG capsule Take 1 capsule (60 mg total) by mouth daily. 11/20/21   de [place], Buren Kos, MD    Family History Family History  Problem Relation Age of Onset   Diabetes Mellitus II Mother    Lupus Mother    Arthritis Mother    Diabetes Mother    Hypertension Mother    Miscarriages / [place] Mother    Lung cancer Father        smoker/worked in factory   Alcohol abuse Father    Cancer Father        smoker, worked in Marine scientist    Early death Father    Hypertension Father    Diabetes Mellitus II Sister    Arthritis Sister    Diabetes Sister    Hypertension Sister    Obesity Sister    Diabetes Mellitus II Brother    Hypercholesterolemia Brother    Diabetes Brother    Cancer Maternal Grandmother        Either uterine or endometrial   Colon cancer Neg Hx    Breast cancer Neg Hx     Social History Social History   Tobacco Use   Smoking status: Never   Smokeless tobacco: Never  Vaping Use   Vaping Use: Never used  Substance Use Topics   Alcohol use: Not Currently    Comment: quit 1994   Drug use: Not Currently    Types: "Crack" cocaine    Comment: quit 1994     Allergies   Topiramate, Chlorhexidine gluconate, and Tape   Review of Systems Review of Systems  Respiratory:  Positive for cough.   Musculoskeletal:  Positive for back pain.  Per HPI   Physical Exam Triage Vital Signs ED Triage Vitals [12/27/21 2013]  Enc Vitals Group     BP (!) 115/40     Pulse Rate 83     Resp 18     Temp 98.4 F (36.9 C)     Temp Source Oral     SpO2 96 %     Weight      Height      Head Circumference      Peak Flow      Pain Score 10     Pain Loc      Pain Edu?      Excl. in GC?    No data found.  Updated Vital Signs BP (!) 115/40 (BP Location: Left Arm)   Pulse 83   Temp 98.4 F (36.9 C) (Oral)   Resp 18   SpO2 96%   Visual Acuity Right Eye Distance:   Left Eye Distance:   Bilateral Distance:    Right Eye Near:   Left Eye Near:    Bilateral Near:  Physical Exam Vitals and nursing note reviewed.  Constitutional:      Appearance: She is not ill-appearing or toxic-appearing.  HENT:     Head: Normocephalic and atraumatic.     Right Ear: Hearing, tympanic membrane, ear canal and external ear normal.     Left Ear: Hearing, tympanic membrane, ear canal and external ear normal.     Nose: Nose normal.     Mouth/Throat:     Lips: Pink.     Mouth: Mucous membranes are moist.      Pharynx: No posterior oropharyngeal erythema.     Comments: Small amount of clear postnasal drainage visualized to the posterior oropharynx.  Eyes:     General: Lids are normal. Vision grossly intact. Gaze aligned appropriately.        Right eye: No discharge.        Left eye: No discharge.     Extraocular Movements: Extraocular movements intact.     Conjunctiva/sclera: Conjunctivae normal.     Pupils: Pupils are equal, round, and reactive to light.  Cardiovascular:     Rate and Rhythm: Normal rate and regular rhythm.     Heart sounds: Normal heart sounds, S1 normal and S2 normal.  Pulmonary:     Effort: Pulmonary effort is normal. No respiratory distress.     Breath sounds: Normal breath sounds and air entry.     Comments: Clear to auscultation bilaterally without respiratory distress. Musculoskeletal:     Cervical back: Neck supple.     Right lower leg: No edema.     Left lower leg: No edema.     Comments: Significantly tender to the right sciatic notch with palpation.  Decreased range of motion to the low back due to pain.  No midline tenderness to the C, T, or L-spine.  No obvious deformity, ecchymosis, warmth, swelling, or erythema.  Lymphadenopathy:     Cervical: No cervical adenopathy.  Skin:    General: Skin is warm and dry.     Capillary Refill: Capillary refill takes less than 2 seconds.     Findings: No rash.  Neurological:     General: No focal deficit present.     Mental Status: She is alert and oriented to person, place, and time. Mental status is at baseline.     Cranial Nerves: No dysarthria or facial asymmetry.     Motor: No weakness.     Gait: Gait abnormal.     Comments: Antalgic gait due to pain to the sciatic notch.  Strength and sensation intact to the bilateral lower extremities.  Psychiatric:        Mood and Affect: Mood normal.        Speech: Speech normal.        Behavior: Behavior normal.        Thought Content: Thought content normal.         Judgment: Judgment normal.      UC Treatments / Results  Labs (all labs ordered are listed, but only abnormal results are displayed) Labs Reviewed - No data to display  EKG   Radiology No results found.  Procedures Procedures (including critical care time)  Medications Ordered in UC Medications  methylPREDNISolone sodium succinate (SOLU-MEDROL) 125 mg/2 mL injection 80 mg (80 mg Intramuscular Given 12/27/21 2047)    Initial Impression / Assessment and Plan / UC Course  I have reviewed the triage vital signs and the nursing notes.  Pertinent labs & imaging results that were available during my  care of the patient were reviewed by me and considered in my medical decision making (see chart for details).   1.  Acute cough Cough is likely related to recent viral URI and will resolve over the next few weeks.  Deferred imaging based on stable cardiopulmonary exam and hemodynamically stable vital signs.  Patient currently takes Allegra intermittently, recommend she takes this daily over the next 1 to 2 weeks to help dry up postnasal drainage contributing to cough.  May take Tessalon Perles every 8 hours as needed for cough and Promethazine DM at bedtime as needed for coughing.  Drowsiness precautions discussed regarding Promethazine DM prescription.  She is to follow-up with PCP if symptoms fail to improve in the next few days.  2.  Sciatica of right side Presentation is consistent with low back pain with right-sided sciatica flare.  Patient requesting steroid injection to kick start anti-inflammatory process, I am agreeable with this.  Solu-Medrol 80 mg IM given in clinic.  Patient to start 40 mg prednisone burst tomorrow morning with food every day for the next 5 days.  No NSAIDs while taking prednisone, patient expresses understanding and agreement with plan.  May continue heat and gentle range of motion exercises to the low back.  She is to follow-up with orthopedics with walking referral  should she find that her symptoms do not improve in the next few days with use of steroid.  Discussed physical exam and available lab work findings in clinic with patient.  Counseled patient regarding appropriate use of medications and potential side effects for all medications recommended or prescribed today. Discussed red flag signs and symptoms of worsening condition,when to call the PCP office, return to urgent care, and when to seek higher level of care in the emergency department. Patient verbalizes understanding and agreement with plan. All questions answered. Patient discharged in stable condition.    Final Clinical Impressions(s) / UC Diagnoses   Final diagnoses:  Acute cough  Sciatica of right side     Discharge Instructions      Use allegra everyday to dry up cough.  Take tessalon pearles every 8 hours as needed for cough. Promethazine DM at bedtime as needed for cough, no driving or drinking alcohol while taking this medicine due to drowsiness side effect.  We gave you a steroid shot today to help with your sciatica. Start taking prednisone  once daily for the next 5 days starting tomorrow with food. No ibuprofen while taking prednisone.  You may continue tylenol as well.   If you develop any new or worsening symptoms or do not improve in the next 2 to 3 days, please return.  If your symptoms are severe, please go to the emergency room.  Follow-up with your primary care provider for further evaluation and management of your symptoms as well as ongoing wellness visits.  I hope you feel better!      ED Prescriptions     Medication Sig Dispense Auth. Provider   predniSONE (DELTASONE) 20 MG tablet Take 2 tablets (40 mg total) by mouth daily for 5 days. 10 tablet Reita May M, FNP   benzonatate (TESSALON) 100 MG capsule Take 1 capsule (100 mg total) by mouth every 8 (eight) hours. 21 capsule Carlisle Beers, FNP   promethazine-dextromethorphan  (PROMETHAZINE-DM) 6.25-15 MG/5ML syrup Take 5 mLs by mouth at bedtime as needed for cough. 118 mL Carlisle Beers, FNP      PDMP not reviewed this encounter.   Carlisle Beers,  FNP 12/29/21 1005

## 2021-12-27 NOTE — ED Notes (Signed)
Waiting on her daughter to arrive to take patient home

## 2021-12-27 NOTE — Discharge Instructions (Addendum)
Use allegra everyday to dry up cough.  Take tessalon pearles every 8 hours as needed for cough. Promethazine DM at bedtime as needed for cough, no driving or drinking alcohol while taking this medicine due to drowsiness side effect.  We gave you a steroid shot today to help with your sciatica. Start taking prednisone 40mg  once daily for the next 5 days starting tomorrow with food. No ibuprofen while taking prednisone.  You may continue tylenol as well.   If you develop any new or worsening symptoms or do not improve in the next 2 to 3 days, please return.  If your symptoms are severe, please go to the emergency room.  Follow-up with your primary care provider for further evaluation and management of your symptoms as well as ongoing wellness visits.  I hope you feel better!

## 2021-12-27 NOTE — ED Triage Notes (Signed)
Pt c/o cough, fever, and SOB x1wk. States no fever in 2 days. Pt c/o rt lower back/hip pain radiating down leg x2 days. Taking OTC meds.

## 2021-12-28 ENCOUNTER — Ambulatory Visit (HOSPITAL_COMMUNITY): Payer: Managed Care, Other (non HMO)

## 2022-01-02 ENCOUNTER — Other Ambulatory Visit: Payer: Self-pay | Admitting: Physical Medicine and Rehabilitation

## 2022-01-03 ENCOUNTER — Telehealth: Payer: Self-pay | Admitting: *Deleted

## 2022-01-03 NOTE — Telephone Encounter (Signed)
Urine drug screen for this encounter is consistent for prescribed medication 

## 2022-01-23 ENCOUNTER — Telehealth: Payer: Self-pay | Admitting: Physical Medicine and Rehabilitation

## 2022-01-23 NOTE — Telephone Encounter (Signed)
Patient called states Dr Tressa Busman requested patient to provide records from back injections , patient only has after visit summaries of specific part of back she got injections on, patient is going to upload information on mychart and bring in to appointment . Patient will also request specific dates of medical records to have provider office sent to [place] .  Patient asking if visit summaries would be ok to bring to visit

## 2022-01-24 ENCOUNTER — Encounter: Payer: Self-pay | Admitting: Physical Medicine & Rehabilitation

## 2022-01-29 ENCOUNTER — Encounter: Payer: Self-pay | Admitting: Physical Medicine & Rehabilitation

## 2022-01-29 ENCOUNTER — Encounter
Payer: Managed Care, Other (non HMO) | Attending: Physical Medicine and Rehabilitation | Admitting: Physical Medicine & Rehabilitation

## 2022-01-29 VITALS — BP 99/69 | HR 83 | Ht 61.0 in | Wt 231.0 lb

## 2022-01-29 DIAGNOSIS — M5440 Lumbago with sciatica, unspecified side: Secondary | ICD-10-CM | POA: Diagnosis present

## 2022-01-29 DIAGNOSIS — G894 Chronic pain syndrome: Secondary | ICD-10-CM | POA: Diagnosis present

## 2022-01-29 DIAGNOSIS — Z5181 Encounter for therapeutic drug level monitoring: Secondary | ICD-10-CM | POA: Insufficient documentation

## 2022-01-29 DIAGNOSIS — M797 Fibromyalgia: Secondary | ICD-10-CM | POA: Insufficient documentation

## 2022-01-29 DIAGNOSIS — M533 Sacrococcygeal disorders, not elsewhere classified: Secondary | ICD-10-CM

## 2022-01-29 DIAGNOSIS — Z79891 Long term (current) use of opiate analgesic: Secondary | ICD-10-CM | POA: Diagnosis present

## 2022-01-29 DIAGNOSIS — G8929 Other chronic pain: Secondary | ICD-10-CM | POA: Diagnosis present

## 2022-01-29 NOTE — Progress Notes (Signed)
Subjective:    Patient ID: Kelsey Simmons, female    DOB: 1960-12-22, 62 y.o.   MRN: [id number] Reviewed records from [facility] HPI 62 year old female referred by her primary physiatrist for chronic low back pain, address interventional spine injection options. The patient has a history of remote work injury and was treated by Dr. Joaquim Nam.  The patient had lumbar radiofrequency at L3-4-5 bilaterally dating back to 2014 she underwent bilateral sacroiliac radiofrequency neurotomy in 2014 as well.  The sacroiliac RF was repeated by that on the right side in 2016.  In addition the patient had bilateral S1 transforaminal injections in 2020.  Bilateral S1 transforaminal injections were repeated in 2021. Other diagnoses include meralgia paresthetica as well as trochanteric bursitis. The patient has right greater than left low back and upper buttock pain as her main complaint.  She has no pain radiating into the lower extremities. She remains independent with all self-care and mobility although for further distances she uses a walker/rollator that allows her to sit down.  In addition to her back issues she does have chronic neck pain as well as  bilateral knee replacements Tramadol 50mg  2-3 per day  Does exercise bike as well as chair aerobics  Pain Inventory Average Pain 6 Pain Right Now 6 My pain is constant, sharp, and burning  In the last 24 hours, has pain interfered with the following? General activity 5 Relation with others 0 Enjoyment of life 6 What TIME of day is your pain at its worst? varies Sleep (in general) Fair  Pain is worse with: walking, standing, and some activites Pain improves with: rest, heat/ice, and medication Relief from Meds: 5  Family History  Problem Relation Age of Onset   Diabetes Mellitus II Mother    Lupus Mother    Arthritis Mother    Diabetes Mother    Hypertension Mother    Miscarriages / Mother    Lung cancer  Father        smoker/worked in factory   Alcohol abuse Father    Cancer Father        smoker, worked in [place]   Early death Father    Hypertension Father    Diabetes Mellitus II Sister    Arthritis Sister    Diabetes Sister    Hypertension Sister    Obesity Sister    Diabetes Mellitus II Brother    Hypercholesterolemia Brother    Diabetes Brother    Cancer Maternal Grandmother        Either uterine or endometrial   Colon cancer Neg Hx    Breast cancer Neg Hx    Social History   Socioeconomic History   Marital status: Married    Spouse name: Lucious   Number of children: 2   Years of education: Not on file   Highest education level: High school graduate  Occupational History   Not on file  Tobacco Use   Smoking status: Never   Smokeless tobacco: Never  Vaping Use   Vaping Use: Never used  Substance and Sexual Activity   Alcohol use: Not Currently    Comment: quit 1994   Drug use: Not Currently    Types: "Crack" cocaine    Comment: quit 1994   Sexual activity: Not Currently    Birth control/protection: Post-menopausal  Other Topics Concern   Not on file  Social History Narrative   Lives with husband   Caffeine- 11 oz c daily  Social Determinants of Health   Financial Resource Strain: Not on file  Food Insecurity: Not on file  Transportation Needs: Not on file  Physical Activity: Not on file  Stress: Not on file  Social Connections: Not on file   Past Surgical History:  Procedure Laterality Date   [place]     GASTRIC BYPASS  2006   JOINT REPLACEMENT     RTKA [place] N/A 04/19/2019   Procedure: XI ROBOTIC [place];  Surgeon: Lafonda Mosses, MD;  Location: [facility];  Service: Gynecology;  Laterality: N/A;   ROBOTIC ASSISTED SALPINGO OOPHERECTOMY Left 04/19/2019   Procedure: XI ROBOTIC ASSISTED SALPINGO  OOPHORECTOMY;  Surgeon: Lafonda Mosses, MD;  [facility]: [facility];  Service: Gynecology;  Laterality: Left;   TOTAL KNEE ARTHROPLASTY Right 08/18/2013   Procedure: RIGHT TOTAL KNEE ARTHROPLASTY;  Surgeon: Tobi Bastos, MD;  [facility]: [facility];  Service: Orthopedics;  Laterality: Right;   TOTAL KNEE ARTHROPLASTY Left 04/13/2014   Procedure: LEFT TOTAL KNEE ARTHROPLASTY;  Surgeon: Latanya Maudlin, MD;  [facility];  Service: Orthopedics;  Laterality: Left;   VENTRAL HERNIA REPAIR N/A 01/11/2019   Procedure: LAPAROSCOPIC VENTRAL HERNIA REPAIR WITH MESH;  Surgeon: Jesusita Oka, MD;  Location: [facility];  Service: General;  Laterality: N/A;   Past Surgical History:  Procedure Laterality Date   ABDOMINAL HYSTERECTOMY  1997   BUNIONECTOMY  2012   CESAREAN SECTION     GASTRIC BYPASS  2006   JOINT REPLACEMENT     RTKA 7'15   ROBOTIC ASSISTED LAPAROSCOPIC LYSIS OF ADHESION N/A 04/19/2019   Procedure: XI ROBOTIC ASSISTED LAPAROSCOPIC LYSIS OF ADHESION;  Surgeon: Lafonda Mosses, MD;  [facility];  Service: Gynecology;  Laterality: N/A;   ROBOTIC ASSISTED SALPINGO OOPHERECTOMY Left 04/19/2019   Procedure: XI ROBOTIC ASSISTED SALPINGO OOPHORECTOMY;  Surgeon: Lafonda Mosses, MD;  [facility];  Service: Gynecology;  Laterality: Left;   TOTAL KNEE ARTHROPLASTY Right 08/18/2013   Procedure: RIGHT TOTAL KNEE ARTHROPLASTY;  Surgeon: Tobi Bastos, MD;  [facility];  Service: Orthopedics;  Laterality: Right;   TOTAL KNEE ARTHROPLASTY Left 04/13/2014   Procedure: LEFT TOTAL KNEE ARTHROPLASTY;  Surgeon: Latanya Maudlin, MD;  [facility];  Service: Orthopedics;  Laterality: Left;   VENTRAL HERNIA REPAIR N/A 01/11/2019   Procedure: LAPAROSCOPIC VENTRAL HERNIA REPAIR WITH MESH;  Surgeon: Jesusita Oka, MD;  Location: [place];  Service: General;  Laterality: N/A;   Past Medical History:  Diagnosis Date   Allergy    Anxiety    Arthritis    Bilateral sacroiliitis ([place])     Chronic back pain    Depression 2020   in therapy but not on medications   Diabetes mellitus without complication ([place])    type 2    Fibromyalgia    Headache(784.0)    HX MIGRAINES   History of kidney stones 2020   left parenchymal stone identified on CT 11/23/18   kidney cyst   Hypercholesterolemia    Hypertension    Lumbar spondylosis    Lumbosacral neuritis    Myofascial pain    Paresthesia of lower extremity    Sleep apnea    HAS NOT USED C PAP SINCE 2007 DUE TO WT LOSS    Trochanteric bursitis    left hip   Ht 5\' 1"  (1.549 m)  Wt 231 lb (104.8 kg)   BMI 43.65 kg/m   Opioid Risk Score:   Fall Risk Score:  `1  Depression screen [facility] 2/9     01/29/2022    3:27 PM 12/10/2021    1:16 PM 11/13/2021    1:19 PM 10/15/2021    3:48 PM 05/29/2021    9:40 AM 04/12/2021    9:58 AM 03/19/2018    8:27 AM  Depression screen PHQ 2/9  Decreased Interest 0 2 3 1 1 1 1   Down, Depressed, Hopeless 0 1 3 1 1 1 1   PHQ - 2 Score 0 3 6 2 2 2 2   Altered sleeping  1 3 0 1 1 1   Tired, decreased energy  1 3 1 1 1 1   Change in appetite  2 3 2  0 1 2  Feeling bad or failure about yourself   1 1 0 0 1 1  Trouble concentrating  1 1 0 0 1 1  Moving slowly or fidgety/restless  0 0 0 0 0 0  Suicidal thoughts  0 0 0 0 0 0  PHQ-9 Score  9 17 5 4 7 8   Difficult doing work/chores  Very difficult Very difficult Somewhat difficult Not difficult at all Somewhat difficult Somewhat difficult      Review of Systems  Musculoskeletal:  Positive for back pain and neck pain.       B/L leg pain   All other systems reviewed and are negative.     Objective:   Physical Exam  General morbidly obese female no acute distress Mood and affect are appropriate Motor strength is 5/5 bilateral hip flexor and knee extensor ankle dorsiflexor Negative straight leg raise bilaterally There is tenderness palpation over the bilateral greater trochanters There is tenderness palpation bilateral PSIS worse on the right  side. Positive thigh thrust test on the right side.  Positive Faber's right SI area on the right side. Lateral compression right SI joint. Lumbar range of motion pain-free 75% of normal lumbar flexion.  Lumbar extension is limited to 25% of normal and accompanied by pain at end range. Ambulates without assistive device no evidence of toe drag or knee instability      Assessment & Plan:   1.  Chronic multifactorial low back and buttock pain.  Her primary pain generator appears to be the right SI joint at the current time.  She has had prior right SI radiofrequency neurotomy approximately 7 years ago.  Think it is reasonable to repeat moderately severe pain despite narcotic analgesic medication..  Will schedule for next month. Dr. Tressa Busman will assess results.  If no appreciable improvement would then proceed to medial branch blocks L3-4-5.  Exercise program exercise bike

## 2022-02-20 ENCOUNTER — Encounter: Payer: Self-pay | Admitting: Physical Medicine and Rehabilitation

## 2022-02-20 ENCOUNTER — Encounter: Payer: Managed Care, Other (non HMO) | Admitting: Physical Medicine and Rehabilitation

## 2022-02-20 VITALS — BP 116/76 | HR 89 | Ht 61.0 in | Wt 229.0 lb

## 2022-02-20 DIAGNOSIS — M5440 Lumbago with sciatica, unspecified side: Secondary | ICD-10-CM | POA: Diagnosis not present

## 2022-02-20 DIAGNOSIS — G894 Chronic pain syndrome: Secondary | ICD-10-CM

## 2022-02-20 DIAGNOSIS — M797 Fibromyalgia: Secondary | ICD-10-CM | POA: Diagnosis not present

## 2022-02-20 DIAGNOSIS — M533 Sacrococcygeal disorders, not elsewhere classified: Secondary | ICD-10-CM | POA: Diagnosis not present

## 2022-02-20 DIAGNOSIS — G8929 Other chronic pain: Secondary | ICD-10-CM

## 2022-02-20 DIAGNOSIS — Z5181 Encounter for therapeutic drug level monitoring: Secondary | ICD-10-CM | POA: Diagnosis not present

## 2022-02-20 DIAGNOSIS — Z79891 Long term (current) use of opiate analgesic: Secondary | ICD-10-CM

## 2022-02-20 NOTE — Patient Instructions (Addendum)
I will refill your Tramadol for three months and have you follow up before the next refill to discuss pain control.  Once you are back from [place], schedule trigger point injections with me.  We will start weaning Duloxetine. Reduce by taking one 30 mg capsule in the morning and 60 mg capsule (90 mg total) for 2 weeks, then stop the morning capsule and continue on 60 mg at nighttime only. Call me in 1-2 weeks or message through MyChart to let me know how this is going.  Next visit, we will discuss referral to PT or Aquatherapy.

## 2022-02-20 NOTE — Progress Notes (Unsigned)
Subjective:    Patient ID: Kelsey Simmons, female    DOB: [birth year], 61 y.o.   MRN: [id number]  HPI Kelsey Simmons is a 62 y.o. year old female  who  has a past medical history of Allergy, Anxiety, Arthritis, Bilateral sacroiliitis ([place]), Chronic back pain, Depression (2020), Diabetes mellitus without complication ([place]), Fibromyalgia, Headache(784.0), History of kidney stones (2020), Hypercholesterolemia, Hypertension, Lumbar spondylosis, Lumbosacral neuritis, Myofascial pain, Paresthesia of lower extremity, Sleep apnea, and Trochanteric bursitis.   They are presenting to PM&R clinic as a new patient for pain management evaluation. They were referred by Dr. [place] for treatment of chronic low back pain and fibromylgia pain.   Chronic pain syndrome Assessment & Plan: Indication for chronic opioid: Chronic low back pain with sciatica Medication and dose: Tramadol 50 mg TID PRN  # pills per month: 90 tabs Last UDS date: 12/10/21 Opioid Treatment Agreement signed (Y/N): Y, 12/10/21 Opioid Treatment Agreement last reviewed with patient:   NCCSRS/PDMP reviewed this encounter (include red flags): Yes    Management will include: Low Risk (<10 MME) UDS every 6-12 months NCCSR check every visit Follow up Q3M initially, Q6M once established    Orders: -     ToxAssure Select,+Antidepr,UR   Fibromyalgia Assessment & Plan: Today, you signed a pain contract and performed a urine drug screen. If the results of that are as expected, we will refill Tramadol 50 mg three times daily as needed #90 tabs R2 (next due 12/13/21). You will follow up with [place] in 3 months and bring your pills in with you at that time.      Chronic bilateral low back pain with sciatica, sciatica laterality unspecified Assessment & Plan: ordered bloodwork for neuropathy and to check your kidney and liver function.    Depending on these results, will likely increase duloxetine from 60 mg daily to up-taper to 60 mg BID.  Alternatively, can consider increase in gabapentin. may recommend other supplements based on results. I will call you to discuss this.       Neuropathic pain -     Comprehensive metabolic panel -     P37 and Folate Panel -     TSH -     T4, free -     Magnesium   Lumbar facet arthropathy Assessment & Plan: Patient endorses Hx best results in pain control with RFA in her low back, unsure which levels. Provided 6-9 months of relief.    Please get records from your last doctor regarding and epidural steroid or ablation procedures.    I will refer you to Dr. Read Drivers for medial branch blocks/ablations in the interim.    MRI lumbar spine 08/27/17 showing multilevel facet arthropathy.    Interval hx: - Saw Dr. Letta Pate 01/29/22; She has had prior right SI radiofrequency neurotomy approximately 7 years ago.  Think it is reasonable to repeat moderately severe pain despite narcotic analgesic medication..  Will schedule for next month. Dr. Tressa Busman will assess results.  If no appreciable improvement would then proceed to medial branch blocks L3-4-5.   - She did increase the Duloxetine which caused double vision, especially at nighttime. Hasn't tried going back down.  - Did notice she was really sad for a few weeks; she said it tapered off. She is seeing a therapist, which is going very well. - Tramadol is helping with her walking, especially up and down stairs. She takes is twice per day religiously, and a third one on occasion with gardening  or cleaning. Last filled 02/14/22.  - She notes poor sleep   Pain Inventory Average Pain 8 Pain Right Now 6 My pain is constant, sharp, and burning  In the last 24 hours, has pain interfered with the following? General activity 5 Relation with others 5 Enjoyment of life 5 What TIME of day is your pain at its worst? daytime Sleep (in general) Fair  Pain is worse with: sitting and some activites Pain improves with: rest and heat/ice Relief from Meds:  2  Family History  Problem Relation Age of Onset   Diabetes Mellitus II Mother    Lupus Mother    Arthritis Mother    Diabetes Mother    Hypertension Mother    Miscarriages / [place] Mother    Lung cancer Father        smoker/worked in factory   Alcohol abuse Father    Cancer Father        smoker, worked in Environmental manager   Early death Father    Hypertension Father    Diabetes Mellitus II Sister    Arthritis Sister    Diabetes Sister    Hypertension Sister    Obesity Sister    Diabetes Mellitus II Brother    Hypercholesterolemia Brother    Diabetes Brother    Cancer Maternal Grandmother        Either uterine or endometrial   Colon cancer Neg Hx    Breast cancer Neg Hx    Social History   Socioeconomic History   Marital status: Married    Spouse name: Lucious   Number of children: 2   Years of education: Not on file   Highest education level: High school graduate  Occupational History   Not on file  Tobacco Use   Smoking status: Never   Smokeless tobacco: Never  Vaping Use   Vaping Use: Never used  Substance and Sexual Activity   Alcohol use: Not Currently    Comment: quit 1994   Drug use: Not Currently    Types: "Crack" cocaine    Comment: quit 1994   Sexual activity: Not Currently    Birth control/protection: Post-menopausal  Other Topics Concern   Not on file  Social History Narrative   Lives with husband   Caffeine- 11 oz c daily   Social Determinants of Health   Financial Resource Strain: Not on file  Food Insecurity: Not on file  Transportation Needs: Not on file  Physical Activity: Not on file  Stress: Not on file  Social Connections: Not on file   Past Surgical History:  Procedure Laterality Date   [place]     GASTRIC BYPASS  2006   JOINT REPLACEMENT     RTKA 7'15   ROBOTIC ASSISTED LAPAROSCOPIC LYSIS OF ADHESION N/A 04/19/2019   Procedure: XI ROBOTIC ASSISTED LAPAROSCOPIC  LYSIS OF ADHESION;  Surgeon: Lafonda Mosses, MD;  [facility];  Service: Gynecology;  Laterality: N/A;   ROBOTIC ASSISTED SALPINGO OOPHERECTOMY Left 04/19/2019   Procedure: XI ROBOTIC ASSISTED SALPINGO OOPHORECTOMY;  Surgeon: Lafonda Mosses, MD;  [facility];  Service: Gynecology;  Laterality: Left;   TOTAL KNEE ARTHROPLASTY Right 08/18/2013   Procedure: RIGHT TOTAL KNEE ARTHROPLASTY;  Surgeon: Tobi Bastos, MD;  [facility];  Service: Orthopedics;  Laterality: Right;   TOTAL KNEE ARTHROPLASTY Left 04/13/2014   Procedure: LEFT TOTAL KNEE ARTHROPLASTY;  Surgeon: Latanya Maudlin, MD;  Location: [facility];  Service: Orthopedics;  Laterality: Left;   VENTRAL HERNIA REPAIR N/A 01/11/2019   Procedure: LAPAROSCOPIC VENTRAL HERNIA REPAIR WITH MESH;  Surgeon: Jesusita Oka, MD;  Location: [place] [facility];  Service: General;  Laterality: N/A;   Past Surgical History:  Procedure Laterality Date   ABDOMINAL HYSTERECTOMY  1997   BUNIONECTOMY  2012   CESAREAN SECTION     GASTRIC BYPASS  2006   JOINT REPLACEMENT     RTKA 7'15   ROBOTIC ASSISTED LAPAROSCOPIC LYSIS OF ADHESION N/A 04/19/2019   Procedure: XI ROBOTIC ASSISTED LAPAROSCOPIC LYSIS OF ADHESION;  Surgeon: Lafonda Mosses, MD;  [facility];  Service: Gynecology;  Laterality: N/A;   ROBOTIC ASSISTED SALPINGO OOPHERECTOMY Left 04/19/2019   Procedure: XI ROBOTIC ASSISTED SALPINGO OOPHORECTOMY;  Surgeon: Lafonda Mosses, MD;  [facility];  Service: Gynecology;  Laterality: Left;   TOTAL KNEE ARTHROPLASTY Right 08/18/2013   Procedure: RIGHT TOTAL KNEE ARTHROPLASTY;  Surgeon: Tobi Bastos, MD;  [facility];  Service: Orthopedics;  Laterality: Right;   TOTAL KNEE ARTHROPLASTY Left 04/13/2014   Procedure: LEFT TOTAL KNEE ARTHROPLASTY;  Surgeon: Latanya Maudlin, MD;  [facility];  Service: Orthopedics;  Laterality: Left;   VENTRAL HERNIA REPAIR N/A 01/11/2019   Procedure: LAPAROSCOPIC VENTRAL HERNIA REPAIR WITH  MESH;  Surgeon: Jesusita Oka, MD;  Location: [place];  Service: General;  Laterality: N/A;   Past Medical History:  Diagnosis Date   Allergy    Anxiety    Arthritis    Bilateral sacroiliitis ([place])    Chronic back pain    Depression 2020   in therapy but not on medications   Diabetes mellitus without complication ([place])    type 2    Fibromyalgia    Headache(784.0)    HX MIGRAINES   History of kidney stones 2020   left parenchymal stone identified on CT 11/23/18   kidney cyst   Hypercholesterolemia    Hypertension    Lumbar spondylosis    Lumbosacral neuritis    Myofascial pain    Paresthesia of lower extremity    Sleep apnea    HAS NOT USED C PAP SINCE 2007 DUE TO WT LOSS    Trochanteric bursitis    left hip   BP 116/76   Pulse 89   Ht 5\' 1"  (1.549 m)   Wt 229 lb (103.9 kg)   SpO2 97%   BMI 43.27 kg/m   Opioid Risk Score:   Fall Risk Score:  `1  Depression screen PHQ 2/9     02/20/2022    3:00 PM 01/29/2022    3:27 PM 12/10/2021    1:16 PM 11/13/2021    1:19 PM 10/15/2021    3:48 PM 05/29/2021    9:40 AM 04/12/2021    9:58 AM  Depression screen PHQ 2/9  Decreased Interest 0 0 2 3 1 1 1   Down, Depressed, Hopeless 0 0 1 3 1 1 1   PHQ - 2 Score 0 0 3 6 2 2 2   Altered sleeping   1 3 0 1 1  Tired, decreased energy   1 3 1 1 1   Change in appetite   2 3 2  0 1  Feeling bad or failure about yourself    1 1 0 0 1  Trouble concentrating   1 1 0 0 1  Moving slowly or fidgety/restless   0 0 0 0 0  Suicidal thoughts   0 0 0 0 0  PHQ-9 Score   9 17 5 4 7   Difficult doing work/chores   Very difficult Very difficult Somewhat difficult Not difficult at all Somewhat difficult      Review of Systems  Musculoskeletal:  Positive for neck pain.  All other systems reviewed and are negative.     Objective:   Physical Exam        Assessment & Plan:

## 2022-02-21 DIAGNOSIS — Z5181 Encounter for therapeutic drug level monitoring: Secondary | ICD-10-CM | POA: Insufficient documentation

## 2022-02-21 DIAGNOSIS — Z79891 Long term (current) use of opiate analgesic: Secondary | ICD-10-CM | POA: Insufficient documentation

## 2022-02-21 MED ORDER — TRAMADOL HCL 50 MG PO TABS: 50.0000 mg | ORAL_TABLET | ORAL | 2 refills | Status: DC

## 2022-02-21 NOTE — Assessment & Plan Note (Addendum)
We will start weaning Duloxetine. Reduce by taking one 30 mg capsule in the morning and 60 mg capsule (90 mg total) for 2 weeks, then stop the morning capsule and continue on 60 mg at nighttime only. Call me in 1-2 weeks or message through MyChart to let me know how this is going.  Planning  right SI radiofrequency neurotomy with Dr. Letta Pate; if no improvement will proceed to MBB L3-4-5

## 2022-02-21 NOTE — Assessment & Plan Note (Signed)
Indication for chronic opioid: Chronic low back pain with sciatica Medication and dose: Tramadol 50 mg TID PRN  # pills per month: 90 tabs Last UDS date: 12/10/21 Opioid Treatment Agreement signed (Y/N): Y, 12/10/21 Opioid Treatment Agreement last reviewed with patient:   NCCSRS/PDMP reviewed this encounter (include red flags): Yes    Management will include: Low Risk (<10 MME) UDS every 6-12 months NCCSR check every visit Follow up Q3M initially, Q6M once established

## 2022-02-21 NOTE — Assessment & Plan Note (Signed)
I will refill your Tramadol for three months and have you follow up before the next refill to discuss pain control.  Once you are back from [place], schedule trigger point injections with me.  Next visit, we will discuss referral to PT or Aquatherapy.

## 2022-02-23 ENCOUNTER — Other Ambulatory Visit (HOSPITAL_BASED_OUTPATIENT_CLINIC_OR_DEPARTMENT_OTHER): Payer: Self-pay | Admitting: Family Medicine

## 2022-02-25 ENCOUNTER — Other Ambulatory Visit (HOSPITAL_BASED_OUTPATIENT_CLINIC_OR_DEPARTMENT_OTHER): Payer: Self-pay

## 2022-02-25 DIAGNOSIS — E1169 Type 2 diabetes mellitus with other specified complication: Secondary | ICD-10-CM

## 2022-02-25 MED ORDER — GABAPENTIN 600 MG PO TABS: 600.0000 mg | ORAL_TABLET | Freq: Three times a day (TID) | ORAL | 0 refills | Status: DC

## 2022-02-25 MED ORDER — TIRZEPATIDE 5 MG/0.5ML ~~LOC~~ SOAJ: 5.0000 mg | SUBCUTANEOUS | 1 refills | Status: DC

## 2022-03-11 ENCOUNTER — Encounter: Payer: Managed Care, Other (non HMO) | Admitting: Physical Medicine and Rehabilitation

## 2022-03-18 ENCOUNTER — Telehealth: Payer: Self-pay

## 2022-03-18 NOTE — Telephone Encounter (Signed)
Right Sacroiliac RF denied. We don't have the information why the denied. Patient called to let us know.

## 2022-03-21 ENCOUNTER — Ambulatory Visit: Payer: Managed Care, Other (non HMO) | Admitting: Physical Medicine & Rehabilitation

## 2022-03-26 ENCOUNTER — Encounter: Payer: Managed Care, Other (non HMO) | Admitting: Physical Medicine & Rehabilitation

## 2022-03-29 ENCOUNTER — Ambulatory Visit (HOSPITAL_BASED_OUTPATIENT_CLINIC_OR_DEPARTMENT_OTHER): Payer: Managed Care, Other (non HMO) | Admitting: Family Medicine

## 2022-04-04 ENCOUNTER — Ambulatory Visit (INDEPENDENT_AMBULATORY_CARE_PROVIDER_SITE_OTHER): Payer: Managed Care, Other (non HMO)

## 2022-04-04 ENCOUNTER — Encounter (HOSPITAL_BASED_OUTPATIENT_CLINIC_OR_DEPARTMENT_OTHER): Payer: Self-pay | Admitting: Family Medicine

## 2022-04-04 ENCOUNTER — Ambulatory Visit (HOSPITAL_BASED_OUTPATIENT_CLINIC_OR_DEPARTMENT_OTHER): Payer: Managed Care, Other (non HMO) | Admitting: Family Medicine

## 2022-04-04 VITALS — BP 111/57 | HR 85 | Ht 61.0 in | Wt 222.0 lb

## 2022-04-04 DIAGNOSIS — E119 Type 2 diabetes mellitus without complications: Secondary | ICD-10-CM

## 2022-04-04 DIAGNOSIS — M79672 Pain in left foot: Secondary | ICD-10-CM | POA: Insufficient documentation

## 2022-04-04 DIAGNOSIS — M25571 Pain in right ankle and joints of right foot: Secondary | ICD-10-CM

## 2022-04-04 DIAGNOSIS — E782 Mixed hyperlipidemia: Secondary | ICD-10-CM | POA: Diagnosis not present

## 2022-04-04 DIAGNOSIS — M25572 Pain in left ankle and joints of left foot: Secondary | ICD-10-CM | POA: Diagnosis not present

## 2022-04-04 NOTE — Assessment & Plan Note (Signed)
Patient reports recent onset of bilateral ankle pain within the past month.  She does recall trauma to left ankle in which she hit outside of left ankle on a rocking chair.  She is not aware of any specific trauma for right ankle, however reports that she can be clumsy with walking and so she may have had trauma and just does not recall it.  She has not had any notable bruising or swelling with either ankle.  She will have some discomfort with ambulating, however pain has not kept her from being able to ambulate altogether.  Denies any history of significant trauma or fracture to either ankle. On exam, left ankle with mild tenderness to palpation over lateral malleolus.  No tenderness to palpation over ATFL, CFL, PTFL.  No tenderness to palpation along medial aspect of ankle.  Normal DP and PT pulse.  No bony tenderness through foot.  Distal neurovascular exam intact.  Right ankle with mild tenderness to palpation over area of ATFL.  No tenderness to palpation over lateral malleolus, CFL, PTFL.  No tenderness to palpation along medial aspect of ankle.  Normal DP and PT pulse.  No bony tenderness through foot.  Distal neurovascular exam intact. Given history and exam, feel that fracture is less likely, however given history of trauma and some bony tenderness over left lateral malleolus, would be reasonable to proceed with x-ray imaging.  Additionally with right ankle, feel that fracture is less likely, however given location of tenderness, would be reasonable proceed with x-ray imaging, patient will have imaging completed today For now, can continue with conservative measures, elevation, icing, activity modification.  If abnormality observed on x-ray imaging, will manage accordingly

## 2022-04-04 NOTE — Progress Notes (Signed)
    Procedures performed today:    None.  Independent interpretation of notes and tests performed by another provider:   None.  Brief History, Exam, Impression, and Recommendations:    BP (!) 111/57 (BP Location: Right Arm, Patient Position: Sitting, Cuff Size: Large)   Pulse 85   Ht 5\' 1"  (1.549 m)   Wt 222 lb (100.7 kg)   SpO2 100%   BMI 41.95 kg/m   Bilateral ankle pain Patient reports recent onset of bilateral ankle pain within the past month.  She does recall trauma to left ankle in which she hit outside of left ankle on a rocking chair.  She is not aware of any specific trauma for right ankle, however reports that she can be clumsy with walking and so she may have had trauma and just does not recall it.  She has not had any notable bruising or swelling with either ankle.  She will have some discomfort with ambulating, however pain has not kept her from being able to ambulate altogether.  Denies any history of significant trauma or fracture to either ankle. On exam, left ankle with mild tenderness to palpation over lateral malleolus.  No tenderness to palpation over ATFL, CFL, PTFL.  No tenderness to palpation along medial aspect of ankle.  Normal DP and PT pulse.  No bony tenderness through foot.  Distal neurovascular exam intact.  Right ankle with mild tenderness to palpation over area of ATFL.  No tenderness to palpation over lateral malleolus, CFL, PTFL.  No tenderness to palpation along medial aspect of ankle.  Normal DP and PT pulse.  No bony tenderness through foot.  Distal neurovascular exam intact. Given history and exam, feel that fracture is less likely, however given history of trauma and some bony tenderness over left lateral malleolus, would be reasonable to proceed with x-ray imaging.  Additionally with right ankle, feel that fracture is less likely, however given location of tenderness, would be reasonable proceed with x-ray imaging, patient will have imaging completed  today For now, can continue with conservative measures, elevation, icing, activity modification.  If abnormality observed on x-ray imaging, will manage accordingly  Diabetes mellitus without complication [facility]) Patient continues on Mounjaro, has been tolerating this well.  Last hemoglobin A1c at goal indicating good control of blood sugars.  She is due for recheck of hemoglobin A1c at this time, has been about 6 months since last check.  No new symptoms such as polyuria or polydipsia. Will proceed with A1c testing and plan for adjustments and management as indicated by results  Return in about 3 months (around 07/05/2022) for DM.   ___________________________________________ Kelsey Craigo de [place], MD, ABFM, [facility] and [place]

## 2022-04-04 NOTE — Assessment & Plan Note (Signed)
Patient continues on [facility], has been tolerating this well.  Last hemoglobin A1c at goal indicating good control of blood sugars.  She is due for recheck of hemoglobin A1c at this time, has been about 6 months since last check.  No new symptoms such as polyuria or polydipsia. Will proceed with A1c testing and plan for adjustments and management as indicated by results

## 2022-04-12 ENCOUNTER — Encounter: Payer: Self-pay | Admitting: Physical Medicine and Rehabilitation

## 2022-04-15 ENCOUNTER — Encounter (HOSPITAL_COMMUNITY): Payer: Self-pay | Admitting: Psychiatry

## 2022-04-15 ENCOUNTER — Ambulatory Visit (HOSPITAL_BASED_OUTPATIENT_CLINIC_OR_DEPARTMENT_OTHER): Payer: 59 | Admitting: Psychiatry

## 2022-04-15 VITALS — Wt 222.0 lb

## 2022-04-15 DIAGNOSIS — F330 Major depressive disorder, recurrent, mild: Secondary | ICD-10-CM | POA: Diagnosis not present

## 2022-04-15 DIAGNOSIS — F411 Generalized anxiety disorder: Secondary | ICD-10-CM | POA: Diagnosis not present

## 2022-04-15 MED ORDER — LAMOTRIGINE 25 MG PO TABS: ORAL_TABLET | ORAL | 0 refills | Status: DC
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
## 2022-04-17 ENCOUNTER — Encounter: Payer: Self-pay | Admitting: Physical Medicine and Rehabilitation

## 2022-04-17 ENCOUNTER — Encounter
Payer: Managed Care, Other (non HMO) | Attending: Physical Medicine and Rehabilitation | Admitting: Physical Medicine and Rehabilitation

## 2022-04-17 VITALS — BP 107/73 | HR 83 | Temp 97.8°F | Ht 61.0 in | Wt 224.0 lb

## 2022-04-17 DIAGNOSIS — M797 Fibromyalgia: Secondary | ICD-10-CM | POA: Insufficient documentation

## 2022-04-17 MED ORDER — LIDOCAINE HCL 1 % IJ SOLN
6.0000 mL | Freq: Once | INTRAMUSCULAR | Status: AC
  Administered 2022-04-17: 6 mL via INTRADERMAL

## 2022-04-17 MED ORDER — TRIAMCINOLONE ACETONIDE 40 MG/ML IJ SUSP
5.0000 mg | Freq: Once | INTRAMUSCULAR | Status: AC
  Administered 2022-04-17: 5.2 mg via INTRAMUSCULAR

## 2022-04-17 NOTE — Patient Instructions (Signed)
-   Resume Usual Activities. Notify Physician of any unusual bleeding, erythema or concern for side effects as reviewed above. - Apply ice prn for pain - Tylenol prn for pain - Follow up in  2-3 weeks for greater trochanteric bursa injection

## 2022-04-17 NOTE — Progress Notes (Signed)
HPI: Kelsey Simmons is a 62 y.o. female with PMHx has Osteoarthritis of right knee; Hx of total knee arthroplasty; History of total knee arthroplasty; Exposure to severe acute respiratory syndrome coronavirus 2 (SARS-CoV-2); Abdominal pain; Acquired spondylolisthesis; Allergic rhinitis; Constipation; Diabetes mellitus without complication ([place]); Fibrocystic breast changes; Fibromyalgia; Gastroesophageal reflux disease without esophagitis; Hypertension; Iron deficiency anemia; Migraine with aura; Pure hypercholesterolemia; Ovarian cyst, left; Rash and nonspecific skin eruption; Prolapsed lumbar disc; Inflammation of sacroiliac joint ([place]); Bilateral sacroiliitis ([place]); BMI 40.0-44.9, adult ([place]); Bone spur of foot; Cervical spondylitis ([place]); Depression, major, single episode, mild ([place]); Chronic bilateral low back pain with sciatica; Lumbar facet arthropathy; Chronic pain syndrome; Neuropathic pain; Encounter for therapeutic drug monitoring; Encounter for long-term opiate analgesic use; Bilateral foot pain; and Bilateral ankle pain on their problem list. who presents to clinic for treatment of pain related to myofascial pain  via injection as described below.    No new concerns or complaints. No major changes in medical history since last visit.   Physical Exam:  General: Appropriate appearance for age.  Mental Status: Appropriate mood and affect.  Cardiovascular: RRR, no m/r/g.  Respiratory: CTAB, no rales/rhonchi/wheezing.  Skin: No apparent rashes or lesions.  Neuro: Awake, alert, and oriented x3. No apparent deficits.  MSK: Moving all 4 limbs antigravity and against resistance.  +TTP bilateral trapezius, levator scapulae, cervical and lumbar paraspinals  PROCEDURE:  Bilateral  trigger point injections Diagnosis: No diagnosis found.  Goals with treatment: [ x ] Decrease pain [  ] Improve Active / Passive ROM [ x ] Improve ADLs [ x ] Improve functional mobility  MEDICATION:  [ x ] Kenalog 40  mg/mL  [ X ] Lidocaine 1%    CONSENT: Obtained in writing per policy. Consent uploaded to chart.  Benefits discussed.  Risks discussed included, but were not limited to, pain and discomfort, bleeding, bruising, allergic reaction, infection. All questions answered to patient/family member/guardian/ caregiver satisfaction. They would like to proceed with procedure. There are no noted contraindications to procedure.  PROCEDURE Time out was preformed No heat sources No antibiotics  The patient was explained about both the benefits and risks of a Bilateral  trigger point injections. After the patient acknowledged an understanding of the risks and benefits, the patient agreed to proceed. The area was first marked and then prepped in an aseptic fashion with betadine / alcohol. A 30 g, 1/2 inch needle was directed via a posterior approach into the bilateral trapezius, levator scapulae, cervical and lumbar paraspinals. The injection was completed with Kenalog 40 mg/ml 0.2 cc mixed with 6 cc of 1% lidocaine after no blood was aspirated on pull back.  No complications were encountered. The patient tolerated the procedure well.  Impression: HPI: Kelsey Simmons is a 62 y.o. female with PMHx has Osteoarthritis of right knee; Hx of total knee arthroplasty; History of total knee arthroplasty; Exposure to severe acute respiratory syndrome coronavirus 2 (SARS-CoV-2); Abdominal pain; Acquired spondylolisthesis; Allergic rhinitis; Constipation; Diabetes mellitus without complication ([place]); Fibrocystic breast changes; Fibromyalgia; Gastroesophageal reflux disease without esophagitis; Hypertension; Iron deficiency anemia; Migraine with aura; Pure hypercholesterolemia; Ovarian cyst, left; Rash and nonspecific skin eruption; Prolapsed lumbar disc; Inflammation of sacroiliac joint ([place]); Bilateral sacroiliitis ([place]); BMI 40.0-44.9, adult ([place]); Bone spur of foot; Cervical spondylitis ([place]); Depression, major, single  episode, mild ([place]); Chronic bilateral low back pain with sciatica; Lumbar facet arthropathy; Chronic pain syndrome; Neuropathic pain; Encounter for therapeutic drug monitoring; Encounter for long-term opiate analgesic use; Bilateral foot pain;  and Bilateral ankle pain on their problem list. who presents to clinic for treatment of myofascial/FM pain . They received a  Bilateral  trigger point injections as above.   PLAN: - Resume Usual Activities. Notify Physician of any unusual bleeding, erythema or concern for side effects as reviewed above. - Apply ice prn for pain - Tylenol prn for pain - Follow up in  2-3 weeks for greater trochanteric bursa injection  Patient/Care Giver was ready to learn without apparent learning barriers. Education was provided on diagnosis, treatment options/plan according to patient's preferred learning style. Patient/Care Giver verbalized understanding and agreement with the above plan.   Gertie Gowda, DO 04/17/2022

## 2022-04-24 ENCOUNTER — Other Ambulatory Visit (HOSPITAL_BASED_OUTPATIENT_CLINIC_OR_DEPARTMENT_OTHER): Payer: Self-pay

## 2022-04-24 DIAGNOSIS — E782 Mixed hyperlipidemia: Secondary | ICD-10-CM

## 2022-04-24 DIAGNOSIS — E119 Type 2 diabetes mellitus without complications: Secondary | ICD-10-CM

## 2022-04-25 LAB — HEMOGLOBIN A1C
Est. average glucose Bld gHb Est-mCnc: 143 mg/dL
Hgb A1c MFr Bld: 6.6 % — ABNORMAL HIGH (ref 4.8–5.6)

## 2022-04-25 LAB — LIPID PANEL
Chol/HDL Ratio: 1.9 ratio (ref 0.0–4.4)
Cholesterol, Total: 223 mg/dL — ABNORMAL HIGH (ref 100–199)
HDL: 115 mg/dL (ref >39)
LDL Chol Calc (NIH): 97 mg/dL (ref 0–99)
Triglycerides: 64 mg/dL (ref 0–149)
VLDL Cholesterol Cal: 11 mg/dL (ref 5–40)

## 2022-05-06 ENCOUNTER — Encounter
Payer: Managed Care, Other (non HMO) | Attending: Physical Medicine and Rehabilitation | Admitting: Physical Medicine and Rehabilitation

## 2022-05-06 VITALS — BP 106/76 | HR 83 | Ht 61.0 in | Wt 222.6 lb

## 2022-05-06 DIAGNOSIS — M7072 Other bursitis of hip, left hip: Secondary | ICD-10-CM | POA: Insufficient documentation

## 2022-05-06 MED ORDER — TRIAMCINOLONE ACETONIDE 40 MG/ML IJ SUSP
40.0000 mg | Freq: Once | INTRAMUSCULAR | Status: AC
  Administered 2022-05-06: 40 mg via INTRAMUSCULAR

## 2022-05-06 MED ORDER — LIDOCAINE HCL 1 % IJ SOLN
2.0000 mL | Freq: Once | INTRAMUSCULAR | Status: AC
  Administered 2022-05-06: 2 mL

## 2022-05-06 NOTE — Patient Instructions (Signed)
-   Resume Usual Activities. Notify Physician of any unusual bleeding, erythema or concern for side effects as reviewed above. - Apply ice prn for pain - Tylenol prn for pain - Follow up as scheduled to assess response to injection  

## 2022-05-06 NOTE — Progress Notes (Signed)
HPI: Kelsey Simmons is a 62 y.o. female with PMHx has Osteoarthritis of right knee; Hx of total knee arthroplasty; History of total knee arthroplasty; Exposure to severe acute respiratory syndrome coronavirus 2 (SARS-CoV-2); Abdominal pain; Acquired spondylolisthesis; Allergic rhinitis; Constipation; Diabetes mellitus without complication; Fibrocystic breast changes; Fibromyalgia; Gastroesophageal reflux disease without esophagitis; Hypertension; Iron deficiency anemia; Migraine with aura; Pure hypercholesterolemia; Ovarian cyst, left; Rash and nonspecific skin eruption; Prolapsed lumbar disc; Inflammation of sacroiliac joint; Bilateral sacroiliitis; BMI 40.0-44.9, adult; Bone spur of foot; Cervical spondylitis; Depression, major, single episode, mild; Chronic bilateral low back pain with sciatica; Lumbar facet arthropathy; Chronic pain syndrome; Neuropathic pain; Encounter for therapeutic drug monitoring; Encounter for long-term opiate analgesic use; Bilateral foot pain; and Bilateral ankle pain on their problem list. who presents to clinic for treatment of pain related to L hip  via injection as described below.    No new concerns or complaints. No major changes in medical history since last visit. Trigger point injectins helped her lower neck; upper neck remains painful. She wears a brace around her neck sometimes which helps her reposition and keep her back up.   Physical Exam:  General: Appropriate appearance for age.  Mental Status: Appropriate mood and affect.  Cardiovascular: RRR, no m/r/g.  Respiratory: CTAB, no rales/rhonchi/wheezing.  Skin: No apparent rashes or lesions.  Neuro: Awake, alert, and oriented x3. No apparent deficits.  MSK:  Moving all 4 limbs antigravity and against resistance.  + TTP L greater trochanteric bursa  PROCEDURE:  Left   greater trochanteric bursa injection Diagnosis:    ICD-10-CM   1. Bursitis of left hip, unspecified bursa  M70.72 lidocaine (XYLOCAINE) 1 %  (with pres) injection 2 mL    triamcinolone acetonide (KENALOG-40) injection 40 mg      Goals with treatment: [ x ] Decrease pain [ x ] Improve Active / Passive ROM [  ] Improve ADLs [ x ] Improve functional mobility  MEDICATION:  [ x ] Kenalog 40 mg/mL  [ X ] Lidocaine 1%    CONSENT: Obtained in writing per policy. Consent uploaded to chart.  Benefits discussed.  Risks discussed included, but were not limited to, pain and discomfort, bleeding, bruising, allergic reaction, infection. All questions answered to patient/family member/guardian/ caregiver satisfaction. They would like to proceed with procedure. There are no noted contraindications to procedure.  PROCEDURE Time out was preformed No heat sources No antibiotics  The patient was explained about both the benefits and risks of a Left  grater trochanteric bursa injection. After the patient acknowledged an understanding of the risks and benefits, the patient agreed to proceed. The area was first marked and then prepped in an aseptic fashion with betadine / alcohol. A 27 g, 2 inch needle was directed via a lateral approach into the Left  grater trochanteric bursa. The injection was completed with Kenalog 40 mg/ml 1 cc mixed with 2 cc of 1% lidocaine after no blood was aspirated on pull back.  The pt tolerated the procedure well.   Preprocedural pain was 10 /10 Postprocedural pain was 7 /10 No complications were encountered. The patient tolerated the procedure well.  Impression: HPI: Kelsey Simmons is a 62 y.o. female with PMHx has Osteoarthritis of right knee; Hx of total knee arthroplasty; History of total knee arthroplasty; Exposure to severe acute respiratory syndrome coronavirus 2 (SARS-CoV-2); Abdominal pain; Acquired spondylolisthesis; Allergic rhinitis; Constipation; Diabetes mellitus without complication; Fibrocystic breast changes; Fibromyalgia; Gastroesophageal reflux disease without esophagitis; Hypertension; Iron  deficiency  anemia; Migraine with aura; Pure hypercholesterolemia; Ovarian cyst, left; Rash and nonspecific skin eruption; Prolapsed lumbar disc; Inflammation of sacroiliac joint; Bilateral sacroiliitis; BMI 40.0-44.9, adult; Bone spur of foot; Cervical spondylitis; Depression, major, single episode, mild; Chronic bilateral low back pain with sciatica; Lumbar facet arthropathy; Chronic pain syndrome; Neuropathic pain; Encounter for therapeutic drug monitoring; Encounter for long-term opiate analgesic use; Bilateral foot pain; and Bilateral ankle pain on their problem list. who presents to clinic for treatment of Left  grater trochanteric bursa pain . They received a  Left greater trochanteric bursa injection. as above.   PLAN: - Resume Usual Activities. Notify Physician of any unusual bleeding, erythema or concern for side effects as reviewed above. - Apply ice prn for pain - Tylenol prn for pain - Follow up as scheduled to assess response to injection   Patient/Care Giver was ready to learn without apparent learning barriers. Education was provided on diagnosis, treatment options/plan according to patient's preferred learning style. Patient/Care Giver verbalized understanding and agreement with the above plan.   Angelina Sheriff, DO 05/06/2022

## 2022-05-07 ENCOUNTER — Other Ambulatory Visit (HOSPITAL_COMMUNITY): Payer: Self-pay | Admitting: Psychiatry

## 2022-05-07 DIAGNOSIS — F330 Major depressive disorder, recurrent, mild: Secondary | ICD-10-CM

## 2022-05-07 DIAGNOSIS — F411 Generalized anxiety disorder: Secondary | ICD-10-CM

## 2022-05-08 ENCOUNTER — Telehealth (HOSPITAL_BASED_OUTPATIENT_CLINIC_OR_DEPARTMENT_OTHER): Payer: 59 | Admitting: Psychiatry

## 2022-05-08 ENCOUNTER — Encounter (HOSPITAL_COMMUNITY): Payer: Self-pay | Admitting: Psychiatry

## 2022-05-08 DIAGNOSIS — F411 Generalized anxiety disorder: Secondary | ICD-10-CM | POA: Diagnosis not present

## 2022-05-08 DIAGNOSIS — F330 Major depressive disorder, recurrent, mild: Secondary | ICD-10-CM | POA: Diagnosis not present

## 2022-05-08 MED ORDER — LAMOTRIGINE 25 MG PO TABS: ORAL_TABLET | ORAL | 1 refills | Status: DC

## 2022-05-08 NOTE — Progress Notes (Signed)
[facility] MD Virtual Progress Note   Patient Location: Home Provider Location: Home Office  I connect with patient by video and verified that I am speaking with correct person by using two identifiers. I discussed the limitations of evaluation and management by telemedicine and the availability of in person appointments. I also discussed with the patient that there may be a patient responsible charge related to this service. The patient expressed understanding and agreed to proceed.  Kelsey Simmons [id number] 62 y.o.  05/08/2022 1:08 PM  History of Present Illness:  Patient is 62 year old African-American, married, employed female who was seen first time 4 weeks ago as initial appointment.  She was referred from primary care physician for the management of anxiety and depression.  Patient has multiple stressors.  She had a complex family stress, her own general health, chronic pain.  Her 78 year old grandson lives in [place] with his mother and patient is not happy that her 5 year old son not involved in taking care of the grandson.  She mentioned that grandson is living with 4 other siblings and she does not like how her mother treat patient's grandson.  She also have chronic neck and back pain.  She is taking pain medicine and getting Cymbalta and Neurontin.  She was having crying spells, severe depression, ruminative thoughts, passive and fleeting negative thoughts.  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  She is married but sometimes she feels that her husband does not support as much and stays at home.  We started her on low-dose Lamictal and she is taking 50 mg.  She noticed a change in her mood and depression.  She did not have any crying spells and she is more optimistic.  She started to learn to avoid phone calls coming from her 39 year old grandson which usually disturb her a lot.  She also in therapy with Jacinto Reap for counseling.  She  reported her appetite is better and she is sleeping better.  He does not have much racing thoughts, feeling of hopelessness or worthlessness.  She has no tremors, shakes or any EPS.  She lives with her husband but her daughter lives close by who is a Child psychotherapist and had multiple health issues.  She does in touch with her son lately who require some help financially because he had a motor vehicle accident and his car was totaled.  Patient did mention again to her son about taking the responsibility of 85 year old.  She is not sure if the son will listen or do but at least she feel her job is to remind him.  Patient admitted it does not bother her anymore which she used to in the past.  She denies any aggression, violence, suicidal thoughts.  She has no tremors or shakes.  She is taking Cymbalta from Dr. De Peru and that is helping her anxiety depression and neuropathy.  Recently she received injection in her neck that helped some of her pain.  She is still need rollerblade if she need to go for long walk.  She started going outside more as weather getting better.  Past Psychiatric History: H/O drug use and brief psychiatric inpatient in mid 35s for passive suicidal thoughts.  No history of attempt.  H/O twice rehab program.  H/O marijuana, crack and cocaine use.  Tried Paxil at that time but never consistent with treatment and follow-up.  PCP tried Cymbalta up to 120 mg but caused diplopia. Never seen psychiatrist  before. No h/o psychosis, mania.    Outpatient Encounter Medications as of 05/08/2022  Medication Sig   amLODipine (NORVASC) 5 MG tablet Take 1 tablet (5 mg total) by mouth daily.   atorvastatin (LIPITOR) 20 MG tablet Take 1 tablet (20 mg total) by mouth daily.   DULoxetine HCl 60 MG CSDR    fexofenadine (ALLEGRA) 180 MG tablet Take by mouth.   fluticasone (FLONASE) 50 MCG/ACT nasal spray Place 2 sprays into both nostrils daily.   gabapentin (NEURONTIN) 600 MG tablet Take 1 tablet (600 mg  total) by mouth 3 (three) times daily.   hydrochlorothiazide (HYDRODIURIL) 25 MG tablet Take 1 tablet (25 mg total) by mouth every morning.   lamoTRIgine (LAMICTAL) 25 MG tablet Take one tab daily for one week and than two tab daily   SUMAtriptan (IMITREX) 100 MG tablet Take 1 tablet (100 mg total) by mouth every 2 (two) hours as needed for migraine or headache. May repeat in 2 hours if headache persists or recurs.   tirzepatide [facility]) 5 MG/0.5ML Pen Inject 5 mg into the skin once a week.   traMADol (ULTRAM) 50 MG tablet Take 1 tablet (50 mg total) by mouth See admin instructions. Take  twice a day. Take additional  once a day as needed for pain.   [DISCONTINUED] DULoxetine (CYMBALTA) 60 MG capsule Take 1 capsule (60 mg total) by mouth daily.   No facility-administered encounter medications on file as of 05/08/2022.    Recent Results (from the past 2160 hour(s))  Lipid panel     Status: Abnormal   Collection Time: 04/24/22  3:19 PM  Result Value Ref Range   Cholesterol, Total 223 (H) 100 - 199 mg/dL   Triglycerides 64 0 - 149 mg/dL   HDL 914 >78 mg/dL   VLDL Cholesterol Cal 11 5 - 40 mg/dL   LDL Chol Calc (NIH) 97 0 - 99 mg/dL   Chol/HDL Ratio 1.9 0.0 - 4.4 ratio    Comment:                                   T. Chol/HDL Ratio                                             Men  Women                               1/2 Avg.Risk  3.4    3.3                                   Avg.Risk  5.0    4.4                                2X Avg.Risk  9.6    7.1                                3X Avg.Risk 23.4   11.0   Hemoglobin A1c     Status: Abnormal   Collection Time: 04/24/22  3:19 PM  Result Value  Ref Range   Hgb A1c MFr Bld 6.6 (H) 4.8 - 5.6 %    Comment:          Prediabetes: 5.7 - 6.4          Diabetes: >6.4          Glycemic control for adults with diabetes: <7.0    Est. average glucose Bld gHb Est-mCnc 143 mg/dL     Psychiatric Specialty Exam: Physical Exam  Review of  Systems  Neurological:        Tingling    Weight 222 lb (100.7 kg).There is no height or weight on file to calculate BMI.  General Appearance: Casual and wearing neck collar  Eye Contact:  Good  Speech:  Clear and Coherent and Slow  Volume:  Decreased  Mood:  Dysphoric  Affect:  Appropriate  Thought Process:  Goal Directed  Orientation:  Full (Time, Place, and Person)  Thought Content:  Rumination  Suicidal Thoughts:  No  Homicidal Thoughts:  No  Memory:  Immediate;   Good Recent;   Good Remote;   Good  Judgement:  Intact  Insight:  Present  Psychomotor Activity:  Normal  Concentration:  Concentration: Good and Attention Span: Good  Recall:  Good  Fund of Knowledge:  Good  Language:  Good  Akathisia:  No  Handed:  Right  AIMS (if indicated):     Assets:  Communication Skills Desire for Improvement Housing Social Support Talents/Skills Transportation  ADL's:  Intact  Cognition:  WNL  Sleep:  ok     Assessment/Plan: GAD (generalized anxiety disorder) - Plan: lamoTRIgine (LAMICTAL) 25 MG tablet  MDD (major depressive disorder), recurrent episode, mild - Plan: lamoTRIgine (LAMICTAL) 25 MG tablet  Patient is taking Lamictal 50 mg and so far no side effects.  She is also taking Cymbalta 60 mg prescribed by primary care for help pain.  I recommend should consider going the dose 75 mg and I also discussed a therapeutic dose of Lamictal.  She really liked the medication.  I also encouraged to continue therapy with Randa Evens.  Discussed need to talk about her relationship with her grandson, father with a therapist to had a better coping skills.  I also offered her if she need Cymbalta from our office then she can let us know and we can send the refill.  She also talked about the Wellbutrin however I recommend Cymbalta has advantage to help pain.  She is on Mounjaro but has not seen significant improvement in her weight.  I encourage should consider talking to her physician switching  to a different weight loss medication.  Patient has type 2 diabetes and her last hemoglobin A1c was 6.6.  Discussed medication side effect specially if she had any rash with Lamictal then she need to call us immediately.  Follow-up in 2 months   Follow Up Instructions:     I discussed the assessment and treatment plan with the patient. The patient was provided an opportunity to ask questions and all were answered. The patient agreed with the plan and demonstrated an understanding of the instructions.   The patient was advised to call back or seek an in-person evaluation if the symptoms worsen or if the condition fails to improve as anticipated.    Collaboration of Care: Other provider involved in patient's care AEB notes are available in epic to review.  Patient/Guardian was advised Release of Information must be obtained prior to any record release in order to collaborate their care  with an outside provider. Patient/Guardian was advised if they have not already done so to contact the registration department to sign all necessary forms in order for [place] to release information regarding their care.   Consent: Patient/Guardian gives verbal consent for treatment and assignment of benefits for services provided during this visit. Patient/Guardian expressed understanding and agreed to proceed.     I provided 31 minutes of non face to face time during this encounter.  Note: This document was prepared by [employer] voice dictation technology and any errors that results from this process are unintentional.    Cleotis Nipper, MD 05/08/2022

## 2022-05-14 ENCOUNTER — Encounter (HOSPITAL_BASED_OUTPATIENT_CLINIC_OR_DEPARTMENT_OTHER): Payer: Self-pay | Admitting: Family Medicine

## 2022-05-14 ENCOUNTER — Other Ambulatory Visit (HOSPITAL_BASED_OUTPATIENT_CLINIC_OR_DEPARTMENT_OTHER): Payer: Self-pay | Admitting: Family Medicine

## 2022-05-14 DIAGNOSIS — I10 Essential (primary) hypertension: Secondary | ICD-10-CM

## 2022-05-14 DIAGNOSIS — M797 Fibromyalgia: Secondary | ICD-10-CM

## 2022-05-15 NOTE — Telephone Encounter (Signed)
Pt called in regards to [facility] and provider will send a new script for for her.

## 2022-05-17 ENCOUNTER — Other Ambulatory Visit (HOSPITAL_BASED_OUTPATIENT_CLINIC_OR_DEPARTMENT_OTHER): Payer: Self-pay | Admitting: Family Medicine

## 2022-05-17 MED ORDER — OZEMPIC (0.25 OR 0.5 MG/DOSE) 2 MG/3ML ~~LOC~~ SOPN: 0.2500 mg | PEN_INJECTOR | SUBCUTANEOUS | 0 refills | Status: DC

## 2022-05-21 ENCOUNTER — Telehealth (HOSPITAL_BASED_OUTPATIENT_CLINIC_OR_DEPARTMENT_OTHER): Payer: Self-pay | Admitting: Family Medicine

## 2022-05-21 NOTE — Telephone Encounter (Signed)
Pharmacist called eants a callback at [phone] in re: Oxympic the reference # [id number]

## 2022-05-21 NOTE — Telephone Encounter (Signed)
Spoke with pharmacist Casimiro Needle, clarified ozempic dose and patient should be receiving medication soon

## 2022-05-22 ENCOUNTER — Encounter
Payer: Managed Care, Other (non HMO) | Attending: Physical Medicine and Rehabilitation | Admitting: Physical Medicine and Rehabilitation

## 2022-05-22 ENCOUNTER — Encounter: Payer: Self-pay | Admitting: Physical Medicine and Rehabilitation

## 2022-05-22 VITALS — BP 127/80 | HR 64 | Ht 61.0 in | Wt 227.0 lb

## 2022-05-22 DIAGNOSIS — Z5181 Encounter for therapeutic drug level monitoring: Secondary | ICD-10-CM | POA: Diagnosis not present

## 2022-05-22 DIAGNOSIS — M797 Fibromyalgia: Secondary | ICD-10-CM | POA: Insufficient documentation

## 2022-05-22 DIAGNOSIS — Z79891 Long term (current) use of opiate analgesic: Secondary | ICD-10-CM | POA: Diagnosis not present

## 2022-05-22 DIAGNOSIS — G894 Chronic pain syndrome: Secondary | ICD-10-CM | POA: Diagnosis not present

## 2022-05-22 MED ORDER — TRIAMCINOLONE ACETONIDE 40 MG/ML IJ SUSP
40.0000 mg | Freq: Once | INTRAMUSCULAR | Status: AC
Start: 2022-05-22 — End: 2022-05-22
  Administered 2022-05-22: 40 mg via INTRAMUSCULAR

## 2022-05-22 MED ORDER — LIDOCAINE HCL 1 % IJ SOLN
6.0000 mL | Freq: Once | INTRAMUSCULAR | Status: AC
Start: 2022-05-22 — End: 2022-05-22
  Administered 2022-05-22: 6 mL

## 2022-05-22 NOTE — Progress Notes (Signed)
HPI: Kelsey Simmons is a 62 y.o. female with PMHx has Osteoarthritis of right knee; Hx of total knee arthroplasty; History of total knee arthroplasty; Exposure to severe acute respiratory syndrome coronavirus 2 (SARS-CoV-2); Abdominal pain; Acquired spondylolisthesis; Allergic rhinitis; Constipation; Diabetes mellitus without complication (HCC); Fibrocystic breast changes; Fibromyalgia; Gastroesophageal reflux disease without esophagitis; Hypertension; Iron deficiency anemia; Migraine with aura; Pure hypercholesterolemia; Ovarian cyst, left; Rash and nonspecific skin eruption; Prolapsed lumbar disc; Inflammation of sacroiliac joint (HCC); Bilateral sacroiliitis (HCC); BMI 40.0-44.9, adult (HCC); Bone spur of foot; Cervical spondylitis (HCC); Depression, major, single episode, mild (HCC); Chronic bilateral low back pain with sciatica; Lumbar facet arthropathy; Chronic pain syndrome; Neuropathic pain; Encounter for therapeutic drug monitoring; Encounter for long-term opiate analgesic use; Bilateral foot pain; and Bilateral ankle pain on their problem list. who presents to clinic for treatment of pain related to myofascial pain  via injection as described below.     No new concerns or complaints. No major changes in medical history since last visit.    Physical Exam:  General: Appropriate appearance for age.  Mental Status: Appropriate mood and affect.  Cardiovascular: RRR, no m/r/g.  Respiratory: CTAB, no rales/rhonchi/wheezing.  Skin: No apparent rashes or lesions.  Neuro: Awake, alert, and oriented x3. No apparent deficits.  MSK: Moving all 4 limbs antigravity and against resistance.  +TTP bilateral trapezius, levator scapulae, cervical and lumbar paraspinals   PROCEDURE:  Bilateral  trigger point injections Diagnosis: No diagnosis found.  Goals with treatment: [ x ] Decrease pain [  ] Improve Active / Passive ROM [ x ] Improve ADLs [ x ] Improve functional mobility   MEDICATION:  [ x ]  Kenalog 40 mg/mL  [ X ] Lidocaine 1%      CONSENT: Obtained in writing per policy. Consent uploaded to chart.   Benefits discussed.  Risks discussed included, but were not limited to, pain and discomfort, bleeding, bruising, allergic reaction, infection. All questions answered to patient/family member/guardian/ caregiver satisfaction. They would like to proceed with procedure. There are no noted contraindications to procedure.   PROCEDURE Time out was preformed No heat sources No antibiotics   The patient was explained about both the benefits and risks of a Bilateral  trigger point injections. After the patient acknowledged an understanding of the risks and benefits, the patient agreed to proceed. The area was first marked and then prepped in an aseptic fashion with betadine / alcohol. A 30 g, 1/2 inch needle was directed via a posterior approach into the bilateral trapezius, levator scapulae, cervical and lumbar paraspinals. The injection was completed with Kenalog 40 mg/ml 0.2 cc mixed with 6 cc of 1% lidocaine after no blood was aspirated on pull back.   No complications were encountered. The patient tolerated the procedure well.   Impression: HPI: Kelsey Simmons is a 62 y.o. female with PMHx has Osteoarthritis of right knee; Hx of total knee arthroplasty; History of total knee arthroplasty; Exposure to severe acute respiratory syndrome coronavirus 2 (SARS-CoV-2); Abdominal pain; Acquired spondylolisthesis; Allergic rhinitis; Constipation; Diabetes mellitus without complication (HCC); Fibrocystic breast changes; Fibromyalgia; Gastroesophageal reflux disease without esophagitis; Hypertension; Iron deficiency anemia; Migraine with aura; Pure hypercholesterolemia; Ovarian cyst, left; Rash and nonspecific skin eruption; Prolapsed lumbar disc; Inflammation of sacroiliac joint (HCC); Bilateral sacroiliitis (HCC); BMI 40.0-44.9, adult (HCC); Bone spur of foot; Cervical spondylitis (HCC); Depression,  major, single episode, mild (HCC); Chronic bilateral low back pain with sciatica; Lumbar facet arthropathy; Chronic pain syndrome; Neuropathic pain; Encounter for therapeutic  drug monitoring; Encounter for long-term opiate analgesic use; Bilateral foot pain; and Bilateral ankle pain on their problem list. who presents to clinic for treatment of myofascial/FM pain . They received a  Bilateral  trigger point injections as above.    PLAN: - Resume Usual Activities. Notify Physician of any unusual bleeding, erythema or concern for side effects as reviewed above. - Apply ice prn for pain - Tylenol prn for pain - Follow up as needed for trigger point injecitons; every 6 months for tramadol   Patient/Care Giver was ready to learn without apparent learning barriers. Education was provided on diagnosis, treatment options/plan according to patient's preferred learning style. Patient/Care Giver verbalized understanding and agreement with the above plan.

## 2022-05-22 NOTE — Patient Instructions (Signed)
-   Resume Usual Activities. Notify Physician of any unusual bleeding, erythema or concern for side effects as reviewed above. - Apply ice prn for pain - Tylenol prn for pain - Follow up as needed for trigger point injecitons; every 6 months for tramadol

## 2022-05-25 LAB — TOXASSURE SELECT,+ANTIDEPR,UR

## 2022-06-04 ENCOUNTER — Ambulatory Visit: Payer: Managed Care, Other (non HMO) | Admitting: Podiatry

## 2022-06-04 ENCOUNTER — Other Ambulatory Visit (HOSPITAL_COMMUNITY): Payer: Self-pay | Admitting: Psychiatry

## 2022-06-04 DIAGNOSIS — F411 Generalized anxiety disorder: Secondary | ICD-10-CM

## 2022-06-04 DIAGNOSIS — F330 Major depressive disorder, recurrent, mild: Secondary | ICD-10-CM

## 2022-06-12 ENCOUNTER — Ambulatory Visit: Payer: Managed Care, Other (non HMO) | Admitting: Podiatry

## 2022-06-12 ENCOUNTER — Telehealth: Payer: Self-pay | Admitting: *Deleted

## 2022-06-12 NOTE — Telephone Encounter (Signed)
Urine drug screen for this encounter is consistent for prescribed medication 

## 2022-06-18 ENCOUNTER — Encounter (HOSPITAL_COMMUNITY): Payer: Self-pay

## 2022-06-18 ENCOUNTER — Ambulatory Visit (HOSPITAL_COMMUNITY)
Admission: RE | Admit: 2022-06-18 | Discharge: 2022-06-18 | Disposition: A | Discharge status: Home / Self Care | Payer: Managed Care, Other (non HMO) | Source: Ambulatory Visit | Attending: Family Medicine | Admitting: Family Medicine

## 2022-06-18 ENCOUNTER — Ambulatory Visit (HOSPITAL_COMMUNITY): Payer: Managed Care, Other (non HMO)

## 2022-06-18 ENCOUNTER — Ambulatory Visit (HOSPITAL_COMMUNITY): Payer: Self-pay

## 2022-06-18 ENCOUNTER — Other Ambulatory Visit: Payer: Self-pay

## 2022-06-18 VITALS — BP 116/70 | HR 77 | Temp 98.7°F | Resp 20

## 2022-06-18 DIAGNOSIS — R0781 Pleurodynia: Secondary | ICD-10-CM

## 2022-06-18 DIAGNOSIS — R109 Unspecified abdominal pain: Secondary | ICD-10-CM

## 2022-06-18 LAB — POCT URINALYSIS DIP (MANUAL ENTRY)
Bilirubin, UA: NEGATIVE
Blood, UA: NEGATIVE
Glucose, UA: NEGATIVE mg/dL
Ketones, POC UA: NEGATIVE mg/dL
Leukocytes, UA: NEGATIVE
Nitrite, UA: NEGATIVE
Protein Ur, POC: NEGATIVE mg/dL
Spec Grav, UA: 1.025 (ref 1.010–1.025)
Urobilinogen, UA: 1 E.U./dL
pH, UA: 6.5 (ref 5.0–8.0)

## 2022-06-18 MED ORDER — KETOROLAC TROMETHAMINE 10 MG PO TABS: 10.0000 mg | ORAL_TABLET | Freq: Four times a day (QID) | ORAL | 0 refills | Status: DC | PRN

## 2022-06-18 MED ORDER — KETOROLAC TROMETHAMINE 30 MG/ML IJ SOLN
30.0000 mg | Freq: Once | INTRAMUSCULAR | Status: AC
  Administered 2022-06-18: 30 mg via INTRAMUSCULAR

## 2022-06-18 MED ORDER — KETOROLAC TROMETHAMINE 30 MG/ML IJ SOLN
INTRAMUSCULAR | Status: AC
  Filled 2022-06-18: qty 1

## 2022-06-18 NOTE — ED Triage Notes (Addendum)
Pt reports Lt sided Flank pain started on Friday. Pt reports pain has increased. Pt reports she has pain when she inhales and when ever she moves. Pt reports having had a Kidney stone in the past. Pt also has a pain management contract.

## 2022-06-18 NOTE — Discharge Instructions (Signed)
The urinalysis does not show any red blood cells or white blood cells  Your x-ray did not show any rib problems or problems in the lung fields  You have been given a shot of Toradol 30 mg today.  Ketorolac 10 mg tablets--take 1 tablet every 6 hours as needed for pain.  This is the same medicine that is in the shot we just gave you  If you worsen anyway or you are not improving within the next couple of days, please proceed to the emergency room  Please also call your primary care and follow-up with them about this issue

## 2022-06-18 NOTE — ED Provider Notes (Signed)
[facility]    CSN: [id number] Arrival date & time: 06/18/22  1401      History   Chief Complaint Chief Complaint  Patient presents with   Abdominal Pain    Excruciating pain on left side of torso between waist and rib cage that began on the evening of 06/14/2022 and has gotten progressively worse. - Entered by patient    HPI Kelsey Simmons is a 62 y.o. female.    Abdominal Pain  Here with pain in the left side of her torso and her lower chest.  It began bothering her about 3 days ago.  Is gotten worse.  It hurts more to move certain ways and to bend.  She has not had any fever or chills or nausea or vomiting.  No cough or congestion.  No blood in her urine and no dysuria.  Pain does not radiate   She has had evidence of renal stones on prior imaging, but has never had an active symptomatic kidney stone  She does take tramadol regularly for chronic pain   Past Medical History:  Diagnosis Date   Allergy    Anxiety    Arthritis    Bilateral sacroiliitis (HCC)    Chronic back pain    Depression 2020   in therapy but not on medications   Diabetes mellitus without complication (HCC)    type 2    Fibromyalgia    Headache(784.0)    HX MIGRAINES   History of kidney stones 2020   left parenchymal stone identified on CT 11/23/18   kidney cyst   Hypercholesterolemia    Hypertension    Lumbar spondylosis    Lumbosacral neuritis    Myofascial pain    Paresthesia of lower extremity    Sleep apnea    HAS NOT USED C PAP SINCE 2007 DUE TO WT LOSS    Trochanteric bursitis    left hip    Patient Active Problem List   Diagnosis Date Noted   Bilateral foot pain 04/04/2022   Bilateral ankle pain 04/04/2022   Encounter for therapeutic drug monitoring 02/21/2022   Encounter for long-term opiate analgesic use 02/21/2022   Chronic bilateral low back pain with sciatica 12/10/2021   Lumbar facet arthropathy 12/10/2021   Chronic pain syndrome 12/10/2021   Neuropathic  pain 12/10/2021   Depression, major, single episode, mild (HCC) 11/15/2021   Rash and nonspecific skin eruption 04/13/2021   Bone spur of foot 07/31/2020   Cervical spondylitis (HCC) 03/02/2020   BMI 40.0-44.9, adult (HCC) 07/06/2019   Ovarian cyst, left 04/09/2019   Exposure to severe acute respiratory syndrome coronavirus 2 (SARS-CoV-2) 02/13/2019   Abdominal pain 02/09/2019   Acquired spondylolisthesis 02/09/2019   Allergic rhinitis 02/09/2019   Constipation 02/09/2019   Fibrocystic breast changes 02/09/2019   Fibromyalgia 02/09/2019   Gastroesophageal reflux disease without esophagitis 02/09/2019   Iron deficiency anemia 02/09/2019   Migraine with aura 02/09/2019   Pure hypercholesterolemia 02/09/2019   Bilateral sacroiliitis (HCC) 01/26/2019   Diabetes mellitus without complication (HCC) 09/22/2018   Hypertension 04/12/2015   History of total knee arthroplasty 04/13/2014   Prolapsed lumbar disc 10/14/2013   Osteoarthritis of right knee 08/18/2013   Hx of total knee arthroplasty 08/18/2013   Inflammation of sacroiliac joint (HCC) 11/09/2012    Past Surgical History:  Procedure Laterality Date   ABDOMINAL HYSTERECTOMY  1997   BUNIONECTOMY  2012   CESAREAN SECTION     GASTRIC BYPASS  2006   JOINT  REPLACEMENT     RTKA 7'15   ROBOTIC ASSISTED LAPAROSCOPIC LYSIS OF ADHESION N/A 04/19/2019   Procedure: XI ROBOTIC ASSISTED LAPAROSCOPIC LYSIS OF ADHESION;  Surgeon: Carver Fila, MD;  Location: [facility];  Service: Gynecology;  Laterality: N/A;   ROBOTIC ASSISTED SALPINGO OOPHERECTOMY Left 04/19/2019   Procedure: XI ROBOTIC ASSISTED SALPINGO OOPHORECTOMY;  Surgeon: Carver Fila, MD;  Location: [facility];  Service: Gynecology;  Laterality: Left;   TOTAL KNEE ARTHROPLASTY Right 08/18/2013   Procedure: RIGHT TOTAL KNEE ARTHROPLASTY;  Surgeon: Jacki Cones, MD;  Location: [facility];  Service: Orthopedics;  Laterality: Right;   TOTAL KNEE ARTHROPLASTY Left 04/13/2014    Procedure: LEFT TOTAL KNEE ARTHROPLASTY;  Surgeon: Ranee Gosselin, MD;  Location: [facility];  Service: Orthopedics;  Laterality: Left;   VENTRAL HERNIA REPAIR N/A 01/11/2019   Procedure: LAPAROSCOPIC VENTRAL HERNIA REPAIR WITH MESH;  Surgeon: Diamantina Monks, MD;  Location: [facility];  Service: General;  Laterality: N/A;    OB History   No obstetric history on file.      Home Medications    Prior to Admission medications   Medication Sig Start Date End Date Taking? Authorizing Provider  ketorolac (TORADOL) 10 MG tablet Take 1 tablet (10 mg total) by mouth every 6 (six) hours as needed (pain). 06/18/22  Yes Zenia Resides, MD  amLODipine (NORVASC) 5 MG tablet TAKE 1 TABLET DAILY 05/14/22   Novella Olive, FNP  atorvastatin (LIPITOR) 20 MG tablet TAKE 1 TABLET DAILY 05/14/22   Novella Olive, FNP  DULoxetine (CYMBALTA) 60 MG capsule TAKE 1 CAPSULE DAILY 05/14/22   Novella Olive, FNP  fexofenadine (ALLEGRA) 180 MG tablet Take by mouth. 07/31/20 07/31/21  [provider]  fluticasone (FLONASE) 50 MCG/ACT nasal spray Place 2 sprays into both nostrils daily. 05/18/21   Janeece Agee, NP  gabapentin (NEURONTIN) 600 MG tablet TAKE 1 TABLET THREE TIMES A DAY 05/14/22   Novella Olive, FNP  hydrochlorothiazide (HYDRODIURIL) 25 MG tablet TAKE 1 TABLET EVERY MORNING 05/14/22   Novella Olive, FNP  lamoTRIgine (LAMICTAL) 25 MG tablet Take three tab daily 05/08/22   Arfeen, Phillips Grout, MD  Semaglutide,0.25 or 0.5MG /DOS, (OZEMPIC, 0.25 OR 0.5 MG/DOSE,) 2 MG/3ML SOPN Inject 0.25 mg into the skin once a week. 05/17/22   Alyson Reedy, FNP  SUMAtriptan (IMITREX) 100 MG tablet Take 1 tablet (100 mg total) by mouth every 2 (two) hours as needed for migraine or headache. May repeat in 2 hours if headache persists or recurs. 04/12/21   Janeece Agee, NP  traMADol (ULTRAM) 50 MG tablet Take 1 tablet (50 mg total) by mouth See admin instructions. Take 50mg  twice a day. Take additional 50mg  once a day as needed for  pain. 03/14/22   Angelina Sheriff, DO    Family History Family History  Problem Relation Age of Onset   Diabetes Mellitus II Mother    Lupus Mother    Arthritis Mother    Diabetes Mother    Hypertension Mother    Miscarriages / [place] Mother    Lung cancer Father        smoker/worked in factory   Alcohol abuse Father    Cancer Father        smoker, worked in Marine scientist   Early death Father    Hypertension Father    Diabetes Mellitus II Sister    Arthritis Sister    Diabetes Sister    Hypertension Sister    Obesity Sister  Diabetes Mellitus II Brother    Hypercholesterolemia Brother    Diabetes Brother    Cancer Maternal Grandmother        Either uterine or endometrial   Colon cancer Neg Hx    Breast cancer Neg Hx     Social History Social History   Tobacco Use   Smoking status: Never   Smokeless tobacco: Never  Vaping Use   Vaping Use: Never used  Substance Use Topics   Alcohol use: Not Currently    Comment: quit 1994   Drug use: Not Currently    Types: "Crack" cocaine    Comment: quit 1994     Allergies   Topiramate, Chlorhexidine gluconate, and Tape   Review of Systems Review of Systems  Gastrointestinal:  Positive for abdominal pain.     Physical Exam Triage Vital Signs ED Triage Vitals  Enc Vitals Group     BP 06/18/22 1422 116/70     Pulse Rate 06/18/22 1422 77     Resp 06/18/22 1422 20     Temp 06/18/22 1422 98.7 F (37.1 C)     Temp src --      SpO2 06/18/22 1422 99 %     Weight --      Height --      Head Circumference --      Peak Flow --      Pain Score 06/18/22 1418 8     Pain Loc --      Pain Edu? --      Excl. in GC? --    No data found.  Updated Vital Signs BP 116/70   Pulse 77   Temp 98.7 F (37.1 C)   Resp 20   SpO2 99%   Visual Acuity Right Eye Distance:   Left Eye Distance:   Bilateral Distance:    Right Eye Near:   Left Eye Near:    Bilateral Near:     Physical Exam Vitals reviewed.   Constitutional:      General: She is not in acute distress.    Appearance: She is not ill-appearing, toxic-appearing or diaphoretic.  HENT:     Mouth/Throat:     Mouth: Mucous membranes are moist.  Eyes:     Extraocular Movements: Extraocular movements intact.     Conjunctiva/sclera: Conjunctivae normal.     Pupils: Pupils are equal, round, and reactive to light.  Cardiovascular:     Rate and Rhythm: Normal rate and regular rhythm.     Heart sounds: No murmur heard. Pulmonary:     Effort: Pulmonary effort is normal. No respiratory distress.     Breath sounds: Normal breath sounds. No stridor. No wheezing, rhonchi or rales.  Abdominal:     Tenderness: There is no right CVA tenderness or left CVA tenderness.  Musculoskeletal:     Cervical back: Neck supple.     Comments: There is tenderness in the mid axillary line on the left chest.  No rash and no deformity  Lymphadenopathy:     Cervical: No cervical adenopathy.  Skin:    Coloration: Skin is not pale.  Neurological:     General: No focal deficit present.     Mental Status: She is alert and oriented to person, place, and time.  Psychiatric:        Behavior: Behavior normal.      UC Treatments / Results  Labs (all labs ordered are listed, but only abnormal results are displayed) Labs Reviewed  POCT URINALYSIS  DIP (MANUAL ENTRY)    EKG   Radiology DG Ribs Unilateral W/Chest Left  Result Date: 06/18/2022 CLINICAL DATA:  Left-sided chest pain for several days, no known injury, initial encounter EXAM: LEFT RIBS AND CHEST - 3+ VIEW COMPARISON:  08/06/2013 FINDINGS: Cardiac shadow is at the upper limits of normal in size. The lungs are well aerated bilaterally. No focal infiltrate, effusion or pneumothorax is noted. No acute rib fracture is noted. IMPRESSION: No evidence of acute rib fracture. Electronically Signed   By: Alcide Clever M.D.   On: 06/18/2022 15:06    Procedures Procedures (including critical care  time)  Medications Ordered in UC Medications  ketorolac (TORADOL) 30 MG/ML injection 30 mg (has no administration in time range)    Initial Impression / Assessment and Plan / UC Course  I have reviewed the triage vital signs and the nursing notes.  Pertinent labs & imaging results that were available during my care of the patient were reviewed by me and considered in my medical decision making (see chart for details).       Urinalysis is clear without any red blood cells or leuks.  Last EGFR was 73 Urinalysis is benign without any red cells or white cells.  X-rays do not show any bony problem.  No trouble in the lungs either. Toradol injection is given here and Toradol pills are sent in to treat the pain at home.  She will proceed to the emergency room if she is worse or not improving, and advised to follow-up with her primary care  Final Clinical Impressions(s) / UC Diagnoses   Final diagnoses:  Rib pain on left side  Flank pain     Discharge Instructions      The urinalysis does not show any red blood cells or white blood cells  Your x-ray did not show any rib problems or problems in the lung fields  You have been given a shot of Toradol 30 mg today.  Ketorolac 10 mg tablets--take 1 tablet every 6 hours as needed for pain.  This is the same medicine that is in the shot we just gave you  If you worsen anyway or you are not improving within the next couple of days, please proceed to the emergency room  Please also call your primary care and follow-up with them about this issue     ED Prescriptions     Medication Sig Dispense Auth. Provider   ketorolac (TORADOL) 10 MG tablet Take 1 tablet (10 mg total) by mouth every 6 (six) hours as needed (pain). 20 tablet [place] Vierling, Janace Aris, MD      I have reviewed the PDMP during this encounter.   Zenia Resides, MD 06/18/22 [phone]

## 2022-07-04 ENCOUNTER — Encounter (HOSPITAL_BASED_OUTPATIENT_CLINIC_OR_DEPARTMENT_OTHER): Payer: Self-pay | Admitting: Family Medicine

## 2022-07-08 ENCOUNTER — Encounter (HOSPITAL_BASED_OUTPATIENT_CLINIC_OR_DEPARTMENT_OTHER): Payer: Self-pay | Admitting: Family Medicine

## 2022-07-08 ENCOUNTER — Ambulatory Visit (HOSPITAL_BASED_OUTPATIENT_CLINIC_OR_DEPARTMENT_OTHER): Payer: Managed Care, Other (non HMO) | Admitting: Family Medicine

## 2022-07-08 ENCOUNTER — Telehealth (HOSPITAL_COMMUNITY): Payer: 59 | Admitting: Psychiatry

## 2022-07-08 ENCOUNTER — Other Ambulatory Visit (HOSPITAL_COMMUNITY): Payer: Self-pay | Admitting: Psychiatry

## 2022-07-08 ENCOUNTER — Encounter (HOSPITAL_COMMUNITY): Payer: Self-pay | Admitting: Psychiatry

## 2022-07-08 VITALS — Wt 227.0 lb

## 2022-07-08 VITALS — BP 121/85 | HR 88 | Ht 61.0 in | Wt 227.0 lb

## 2022-07-08 DIAGNOSIS — M25561 Pain in right knee: Secondary | ICD-10-CM | POA: Insufficient documentation

## 2022-07-08 DIAGNOSIS — R21 Rash and other nonspecific skin eruption: Secondary | ICD-10-CM | POA: Diagnosis not present

## 2022-07-08 DIAGNOSIS — M25562 Pain in left knee: Secondary | ICD-10-CM

## 2022-07-08 DIAGNOSIS — F330 Major depressive disorder, recurrent, mild: Secondary | ICD-10-CM

## 2022-07-08 DIAGNOSIS — Z1231 Encounter for screening mammogram for malignant neoplasm of breast: Secondary | ICD-10-CM

## 2022-07-08 DIAGNOSIS — E119 Type 2 diabetes mellitus without complications: Secondary | ICD-10-CM

## 2022-07-08 DIAGNOSIS — F411 Generalized anxiety disorder: Secondary | ICD-10-CM

## 2022-07-08 DIAGNOSIS — Z1211 Encounter for screening for malignant neoplasm of colon: Secondary | ICD-10-CM

## 2022-07-08 MED ORDER — AZELASTINE HCL 0.1 % NA SOLN: 2.0000 | Freq: Two times a day (BID) | NASAL | 1 refills | Status: DC

## 2022-07-08 MED ORDER — BUPROPION HCL ER (XL) 150 MG PO TB24
150.0000 mg | ORAL_TABLET | Freq: Every day | ORAL | 0 refills | Status: DC
Start: 2022-07-08 — End: 2022-08-09

## 2022-07-08 MED ORDER — LAMOTRIGINE 100 MG PO TABS: 100.0000 mg | ORAL_TABLET | Freq: Every day | ORAL | 0 refills | Status: DC

## 2022-07-08 MED ORDER — DULOXETINE HCL 30 MG PO CPEP
30.0000 mg | ORAL_CAPSULE | Freq: Every day | ORAL | 0 refills | Status: DC
Start: 2022-07-08 — End: 2022-08-12

## 2022-07-08 NOTE — Progress Notes (Signed)
    Procedures performed today:    None.  Independent interpretation of notes and tests performed by another provider:   None.  Brief History, Exam, Impression, and Recommendations:    BP 121/85 (BP Location: Left Arm, Patient Position: Sitting, Cuff Size: Normal)   Pulse 88   Ht 5\' 1"  (1.549 m)   Wt 227 lb (103 kg)   SpO2 98%   BMI 42.89 kg/m   Rash Assessment & Plan: Patient reports having rash of her bilateral forearms, primarily along flexor surface.  She has had this rash appearing elsewhere as well.  She recalls having a biopsy in the past, thinks it was at least 3 years ago.  Does not recall any specific results from that biopsy.  She denies any symptoms, no itching, pain.  Has not tried any specific treatments. Over bilateral forearms, small area of hyperpigmentation/slightly raised areas are present.  No erythema, warmth, tenderness to palpation. Uncertain etiology, can proceed with referral to dermatology today for further evaluation and recommendations  Orders: -     Ambulatory referral to Dermatology  Diabetes mellitus without complication (HCC) Assessment & Plan: Recent hemoglobin A1c has been well-controlled.  She denies any new symptoms such as polyuria or polydipsia.  She continues with Mounjaro.  Recently, was looking to switch to Ozempic due to lack of availability for [facility], however Mounjaro did become available and she was able to resume this.  No reported GI issues with Mounjaro. She is not quite due yet for hemoglobin A1c check at this time.  We will plan to check hemoglobin A1c before next appointment.  We will also check urine microalbumin/creatinine ratio at that time Plan to complete foot exam at next office visit  Orders: -     Hemoglobin A1c; Future -     Microalbumin / creatinine urine ratio; Future  Pain in both knees, unspecified chronicity Assessment & Plan: Patient with prior knee joint replacements.  She reports bilateral knee pain over  anterior knees, primarily over patella. Pain worse with use of stairs. Feels that she has had swelling as well.  Surgeon who did knee replacements is now retired. She does have some diffuse tenderness about bilateral knees, most notably along medial joint line, medial aspect of patellas bilaterally. We discussed considerations related to management options.  Feel that patient would benefit from initial trial of physical therapy.  We also discussed possible referral to orthopedic surgeon, she wishes to begin with conservative measures first and referral to physical therapy and then consider referral to establish with new orthopedic surgeon later which is reasonable.  Orders: -     Ambulatory referral to Physical Therapy  Colon cancer screening -     Ambulatory referral to Gastroenterology  Encounter for screening mammogram for malignant neoplasm of breast -     Digital Screening Mammogram, Left and Right; Future  Other orders -     Azelastine HCl; Place 2 sprays into both nostrils 2 (two) times daily. Use in each nostril as directed  Dispense: 30 mL; Refill: 1  Return in about 3 months (around 10/08/2022).   ___________________________________________ Kelsey Bittinger de [place], MD, ABFM, CAQSM Primary Care and Sports Medicine [facility]

## 2022-07-08 NOTE — Assessment & Plan Note (Signed)
Patient reports having rash of her bilateral forearms, primarily along flexor surface.  She has had this rash appearing elsewhere as well.  She recalls having a biopsy in the past, thinks it was at least 3 years ago.  Does not recall any specific results from that biopsy.  She denies any symptoms, no itching, pain.  Has not tried any specific treatments. Over bilateral forearms, small area of hyperpigmentation/slightly raised areas are present.  No erythema, warmth, tenderness to palpation. Uncertain etiology, can proceed with referral to dermatology today for further evaluation and recommendations

## 2022-07-08 NOTE — Assessment & Plan Note (Signed)
Patient with prior knee joint replacements.  She reports bilateral knee pain over anterior knees, primarily over patella. Pain worse with use of stairs. Feels that she has had swelling as well.  Surgeon who did knee replacements is now retired. She does have some diffuse tenderness about bilateral knees, most notably along medial joint line, medial aspect of patellas bilaterally. We discussed considerations related to management options.  Feel that patient would benefit from initial trial of physical therapy.  We also discussed possible referral to orthopedic surgeon, she wishes to begin with conservative measures first and referral to physical therapy and then consider referral to establish with new orthopedic surgeon later which is reasonable.

## 2022-07-08 NOTE — Assessment & Plan Note (Signed)
Recent hemoglobin A1c has been well-controlled.  She denies any new symptoms such as polyuria or polydipsia.  She continues with Mounjaro.  Recently, was looking to switch to Ozempic due to lack of availability for [facility], however Mounjaro did become available and she was able to resume this.  No reported GI issues with Mounjaro. She is not quite due yet for hemoglobin A1c check at this time.  We will plan to check hemoglobin A1c before next appointment.  We will also check urine microalbumin/creatinine ratio at that time Plan to complete foot exam at next office visit

## 2022-07-08 NOTE — Patient Instructions (Signed)
  Medication Instructions:  Your physician recommends that you continue on your current medications as directed. Please refer to the Current Medication list given to you today. --If you need a refill on any your medications before your next appointment, please call your pharmacy first. If no refills are authorized on file call the office.-- Lab Work: Your physician has recommended that you have lab work today: No If you have labs (blood work) drawn today and your tests are completely normal, you will receive your results via MyChart message OR a phone call from our staff.  Please ensure you check your voicemail in the event that you authorized detailed messages to be left on a delegated number. If you have any lab test that is abnormal or we need to change your treatment, we will call you to review the results.  Referrals/Procedures/Imaging: No  Follow-Up: Your next appointment:   Your physician recommends that you schedule a follow-up appointment in: 2-3 months with Dr. de [place].  You will receive a text message or e-mail with a link to a survey about your care and experience with us today! We would greatly appreciate your feedback!   Thanks for letting us be apart of your health journey!!  Primary Care and Sports Medicine   Dr. Raymond de Cuba   We encourage you to activate your patient portal called "MyChart".  Sign up information is provided on this After Visit Summary.  MyChart is used to connect with patients for Virtual Visits (Telemedicine).  Patients are able to view lab/test results, encounter notes, upcoming appointments, etc.  Non-urgent messages can be sent to your provider as well. To learn more about what you can do with MyChart, please visit --  [email or web address].    

## 2022-07-08 NOTE — Progress Notes (Signed)
[facility] MD Virtual Progress Note   Patient Location: Home Provider Location: Home Office  I connect with patient by video and verified that I am speaking with correct person by using two identifiers. I discussed the limitations of evaluation and management by telemedicine and the availability of in person appointments. I also discussed with the patient that there may be a patient responsible charge related to this service. The patient expressed understanding and agreed to proceed.  Kelsey Simmons [id number] 62 y.o.  07/08/2022 4:00 PM  History of Present Illness:  Patient is evaluated by video session.  She has been doing better with adjustment of Lamictal as less crying or ruminative thoughts.  She is more active and started gardening.  She also in therapy with Randa Evens and learning coping skills.  She is keeping boundaries with her grand son who lives in [place], [place].  Patient has a plan to visit her mother in August and she tried to see the grandson if his mother left her visit.  She reported things are much better however now like to again consider Wellbutrin because she feels her weight is an issue.  She has difficulty in her mobilization, chronic pain and recently she had seen the emergency room for sciatica pain.  She was given Toradol injection.  She is taking Cymbalta prescribed by Dr. De Peru for fibromyalgia and pain but like to try Wellbutrin because her sister takes the Wellbutrin and she hoped it helps her weight and appetite.  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  She is taking Lamictal and reported no rash, itching, headaches or shakes.  She denies any hallucination or any paranoia.  She denies any suicidal thoughts.  Patient lives with her husband and her daughter live close by.  Past Psychiatric History: H/O drug use and brief psychiatric inpatient in mid 4s for passive suicidal thoughts.  No history of attempt.  H/O twice rehab program.  H/O  marijuana, crack and cocaine use.  Tried Paxil at that time but never consistent with treatment and follow-up.  PCP tried Cymbalta up to 120 mg but caused diplopia. Never seen psychiatrist before. No h/o psychosis, mania.    Outpatient Encounter Medications as of 07/08/2022  Medication Sig   amLODipine (NORVASC) 5 MG tablet TAKE 1 TABLET DAILY   atorvastatin (LIPITOR) 20 MG tablet TAKE 1 TABLET DAILY   azelastine (ASTELIN) 0.1 % nasal spray Place 2 sprays into both nostrils 2 (two) times daily. Use in each nostril as directed   DULoxetine (CYMBALTA) 60 MG capsule TAKE 1 CAPSULE DAILY   fexofenadine (ALLEGRA) 180 MG tablet Take by mouth.   fluticasone (FLONASE) 50 MCG/ACT nasal spray Place 2 sprays into both nostrils daily.   gabapentin (NEURONTIN) 600 MG tablet TAKE 1 TABLET THREE TIMES A DAY   hydrochlorothiazide (HYDRODIURIL) 25 MG tablet TAKE 1 TABLET EVERY MORNING   ketorolac (TORADOL) 10 MG tablet Take 1 tablet (10 mg total) by mouth every 6 (six) hours as needed (pain).   lamoTRIgine (LAMICTAL) 25 MG tablet Take three tab daily   MOUNJARO 5 MG/0.5ML Pen Inject 5 mg into the skin once a week.   SUMAtriptan (IMITREX) 100 MG tablet Take 1 tablet (100 mg total) by mouth every 2 (two) hours as needed for migraine or headache. May repeat in 2 hours if headache persists or recurs.   traMADol (ULTRAM) 50 MG tablet Take 1 tablet (50 mg total) by mouth See admin instructions. Take 50mg  twice a day.  Take additional 50mg  once a day as needed for pain.   No facility-administered encounter medications on file as of 07/08/2022.    Recent Results (from the past 2160 hour(s))  Lipid panel     Status: Abnormal   Collection Time: 04/24/22  3:19 PM  Result Value Ref Range   Cholesterol, Total 223 (H) 100 - 199 mg/dL   Triglycerides 64 0 - 149 mg/dL   HDL 161 >09 mg/dL   VLDL Cholesterol Cal 11 5 - 40 mg/dL   LDL Chol Calc (NIH) 97 0 - 99 mg/dL   Chol/HDL Ratio 1.9 0.0 - 4.4 ratio    Comment:                                    T. Chol/HDL Ratio                                             Men  Women                               1/2 Avg.Risk  3.4    3.3                                   Avg.Risk  5.0    4.4                                2X Avg.Risk  9.6    7.1                                3X Avg.Risk 23.4   11.0   Hemoglobin A1c     Status: Abnormal   Collection Time: 04/24/22  3:19 PM  Result Value Ref Range   Hgb A1c MFr Bld 6.6 (H) 4.8 - 5.6 %    Comment:          Prediabetes: 5.7 - 6.4          Diabetes: >6.4          Glycemic control for adults with diabetes: <7.0    Est. average glucose Bld gHb Est-mCnc 143 mg/dL  ToxAssure Select Plus     Status: None   Collection Time: 05/22/22  2:54 PM  Result Value Ref Range   Summary Note     Comment: ==================================================================== ToxAssure Select,+Antidepr,UR ==================================================================== Test                             Result       Flag       Units  Drug Present   Tramadol                       >7463                   ng/mg creat   O-Desmethyltramadol            7104  ng/mg creat   N-Desmethyltramadol            2494                    ng/mg creat    Source of tramadol is a prescription medication. O-desmethyltramadol    and N-desmethyltramadol are expected metabolites of tramadol.    Duloxetine                     PRESENT ==================================================================== Test                      Result    Flag   Units      Ref Range   Creatinine              67               mg/dL      >=16 ==================================================================== Declared Medications:  Medication list was not provided. ================================ ==================================== For clinical consultation, please call [phone]. ====================================================================    POC urinalysis dipstick     Status: None   Collection Time: 06/18/22  2:45 PM  Result Value Ref Range   Color, UA yellow yellow   Clarity, UA clear clear   Glucose, UA negative negative mg/dL   Bilirubin, UA negative negative   Ketones, POC UA negative negative mg/dL   Spec Grav, UA 8.119 1.478 - 1.025   Blood, UA negative negative   pH, UA 6.5 5.0 - 8.0   Protein Ur, POC negative negative mg/dL   Urobilinogen, UA 1.0 0.2 or 1.0 E.U./dL   Nitrite, UA Negative Negative   Leukocytes, UA Negative Negative     Psychiatric Specialty Exam: Physical Exam  Review of Systems  Weight 227 lb (103 kg).There is no height or weight on file to calculate BMI.  General Appearance: Casual  Eye Contact:  Good  Speech:  Clear and Coherent and Normal Rate  Volume:  Normal  Mood:  Euthymic  Affect:  Appropriate  Thought Process:  Goal Directed  Orientation:  Full (Time, Place, and Person)  Thought Content:  Logical  Suicidal Thoughts:  No  Homicidal Thoughts:  No  Memory:  Immediate;   Good Recent;   Good Remote;   Good  Judgement:  Good  Insight:  Good  Psychomotor Activity:  Normal  Concentration:  Concentration: Good and Attention Span: Good  Recall:  Good  Fund of Knowledge:  Good  Language:  Good  Akathisia:  No  Handed:  Right  AIMS (if indicated):     Assets:  Communication Skills Desire for Improvement Housing Resilience Talents/Skills Transportation  ADL's:  Intact  Cognition:  WNL  Sleep:  7-8 hrs     Assessment/Plan: MDD (major depressive disorder), recurrent episode, mild (HCC) - Plan: lamoTRIgine (LAMICTAL) 100 MG tablet, DULoxetine (CYMBALTA) 30 MG capsule, buPROPion (WELLBUTRIN XL) 150 MG 24 hr tablet  GAD (generalized anxiety disorder) - Plan: lamoTRIgine (LAMICTAL) 100 MG tablet, DULoxetine (CYMBALTA) 30 MG capsule  I reviewed notes from recent provider visits.  She is taking Lamictal 75.  Recommend to try 100 mg as patient tolerating well and still have  some residual symptoms.  Patient like to try Wellbutrin.  We discussed pros and cons of the Cymbalta and Wellbutrin.  After review she like to consider and we will try Wellbutrin XL 150 mg daily and she will cut down the Cymbalta 30 mg only.  We discussed possible withdrawals and we have  to go slowly to come off from Cymbalta.  We will send these to prescription to her local pharmacy and she will try Lamictal 100 mg which she like to have on [facility].  Recommend to call us back if there is any question or any concern.  She will continue therapy with Randa Evens.  Follow-up in 4 weeks to see the response of Wellbutrin.   Follow Up Instructions:     I discussed the assessment and treatment plan with the patient. The patient was provided an opportunity to ask questions and all were answered. The patient agreed with the plan and demonstrated an understanding of the instructions.   The patient was advised to call back or seek an in-person evaluation if the symptoms worsen or if the condition fails to improve as anticipated.    Collaboration of Care: Other provider involved in patient's care AEB notes are available in epic to review.  Patient/Guardian was advised Release of Information must be obtained prior to any record release in order to collaborate their care with an outside provider. Patient/Guardian was advised if they have not already done so to contact the registration department to sign all necessary forms in order for [place] to release information regarding their care.   Consent: Patient/Guardian gives verbal consent for treatment and assignment of benefits for services provided during this visit. Patient/Guardian expressed understanding and agreed to proceed.     I provided 25 minutes of non face to face time during this encounter.  Note: This document was prepared by [employer] voice dictation technology and any errors that results from this process are unintentional.    Cleotis Nipper,  MD 07/08/2022

## 2022-07-09 ENCOUNTER — Other Ambulatory Visit (HOSPITAL_BASED_OUTPATIENT_CLINIC_OR_DEPARTMENT_OTHER): Payer: Self-pay

## 2022-07-09 ENCOUNTER — Telehealth (HOSPITAL_COMMUNITY): Payer: Self-pay

## 2022-07-09 MED ORDER — AZELASTINE HCL 0.1 % NA SOLN: 2.0000 | Freq: Two times a day (BID) | NASAL | 1 refills | Status: DC

## 2022-07-09 MED ORDER — AZELASTINE HCL 0.1 % NA SOLN: 2.0000 | Freq: Two times a day (BID) | NASAL | 2 refills | Status: DC

## 2022-07-09 NOTE — Telephone Encounter (Signed)
We agreed to try a 30-day supply because dose can be adjusted on the next appointment.  She has appointment coming in July 19.  I will wait until then.

## 2022-07-09 NOTE — Telephone Encounter (Signed)
Patient called this morning, she would like to know if you can resend the Bupropion for 90 days instead of 30 days. Patients insurance prefers 90 days and it is cheaper for her. [facility] [address]

## 2022-07-12 ENCOUNTER — Other Ambulatory Visit: Payer: Self-pay | Admitting: Physical Medicine and Rehabilitation

## 2022-07-12 DIAGNOSIS — G894 Chronic pain syndrome: Secondary | ICD-10-CM

## 2022-07-12 DIAGNOSIS — M797 Fibromyalgia: Secondary | ICD-10-CM

## 2022-07-12 DIAGNOSIS — G8929 Other chronic pain: Secondary | ICD-10-CM

## 2022-07-12 NOTE — Telephone Encounter (Signed)
Last filled 06/18/22. Refill due 07/18/22, refilled Tramadol #90 tabs 1 tab BID + 1 tab PRN R2.  Has follow up with Riley Lam 11/2022; will need monitorred Q6M.

## 2022-07-15 NOTE — Telephone Encounter (Signed)
Mychart message sent by pt:  Kelsey Simmons [facility] (supporting Hosie Poisson Peru, MD)3 days ago    I failed to ask for referrals to neurologist and rheumatologist for the leg and back pain. Do I need to be seen again or can it be done since I saw him this week?    Dr. De Peru, please advise on this.

## 2022-07-21 ENCOUNTER — Other Ambulatory Visit (HOSPITAL_COMMUNITY): Payer: Self-pay | Admitting: Psychiatry

## 2022-07-21 DIAGNOSIS — F330 Major depressive disorder, recurrent, mild: Secondary | ICD-10-CM

## 2022-07-21 DIAGNOSIS — F411 Generalized anxiety disorder: Secondary | ICD-10-CM

## 2022-07-23 NOTE — Progress Notes (Signed)
Subjective:    Patient ID: Kelsey Simmons, female    DOB: [birth year], 62 y.o.   MRN: [id number]  HPI HPI: Kelsey Simmons is a 62 y.o. female with PMHx has Osteoarthritis of right knee; Hx of total knee arthroplasty; History of total knee arthroplasty; Exposure to severe acute respiratory syndrome coronavirus 2 (SARS-CoV-2); Abdominal pain; Acquired spondylolisthesis; Allergic rhinitis; Constipation; Diabetes mellitus without complication (HCC); Fibrocystic breast changes; Fibromyalgia; Gastroesophageal reflux disease without esophagitis; Hypertension; Iron deficiency anemia; Migraine with aura; Pure hypercholesterolemia; Ovarian cyst, left; Rash and nonspecific skin eruption; Prolapsed lumbar disc; Inflammation of sacroiliac joint (HCC); Bilateral sacroiliitis (HCC); BMI 40.0-44.9, adult (HCC); Bone spur of foot; Cervical spondylitis (HCC); Depression, major, single episode, mild (HCC); Chronic bilateral low back pain with sciatica; Lumbar facet arthropathy; Chronic pain syndrome; Neuropathic pain; Encounter for therapeutic drug monitoring; Encounter for long-term opiate analgesic use; Bilateral foot pain; and Bilateral ankle pain on their problem list. who presents to clinic for treatment of pain related to myofascial pain  via injection as described below.     No new concerns or complaints. No major changes in medical history since last visit.   Endorsing chronic bilateral burning and stabbing pains in her hips and lateral thighs ater her surgery; has been going through it for 15 years, asking for neurosurgery referral. Had MRI in Lumbar spine in 2019 and ESI which helped with localized pain but not burning thigh pain. Increases with activity.    Physical Exam:  General: Appropriate appearance for age. +Obese Mental Status: Appropriate mood and affect.  Cardiovascular: RRR, no m/r/g.  Respiratory: CTAB, no rales/rhonchi/wheezing.  Skin: No apparent rashes or lesions.  Neuro: Awake, alert, and  oriented x3. No apparent deficits.  MSK: Moving all 4 limbs antigravity and against resistance.  +TTP bilateral trapezius, levator scapulae, cervical and lumbar paraspinals   PROCEDURE:  Bilateral  trigger point injections Diagnosis: No diagnosis found.  Goals with treatment: [ x ] Decrease pain [  ] Improve Active / Passive ROM [ x ] Improve ADLs [ x ] Improve functional mobility   MEDICATION:  [ x ] Kenalog 40 mg/mL  [ X ] Lidocaine 1%      CONSENT: Obtained in writing per policy. Consent uploaded to chart.   Benefits discussed.  Risks discussed included, but were not limited to, pain and discomfort, bleeding, bruising, allergic reaction, infection. All questions answered to patient/family member/guardian/ caregiver satisfaction. They would like to proceed with procedure. There are no noted contraindications to procedure.   PROCEDURE Time out was preformed No heat sources No antibiotics   The patient was explained about both the benefits and risks of a Bilateral  trigger point injections. After the patient acknowledged an understanding of the risks and benefits, the patient agreed to proceed. The area was first marked and then prepped in an aseptic fashion with betadine / alcohol. A 30 g, 1/2 inch needle was directed via a posterior approach into the bilateral trapezius, levator scapulae, cervical and lumbar paraspinals. The injection was completed with Kenalog 40 mg/ml 0.2 cc mixed with 6 cc of 1% lidocaine after no blood was aspirated on pull back.   No complications were encountered. The patient tolerated the procedure well.   Impression: HPI: Kelsey Simmons is a 62 y.o. female with PMHx has Osteoarthritis of right knee; Hx of total knee arthroplasty; History of total knee arthroplasty; Exposure to severe acute respiratory syndrome coronavirus 2 (SARS-CoV-2); Abdominal pain; Acquired spondylolisthesis; Allergic rhinitis; Constipation; Diabetes mellitus  without complication (HCC);  Fibrocystic breast changes; Fibromyalgia; Gastroesophageal reflux disease without esophagitis; Hypertension; Iron deficiency anemia; Migraine with aura; Pure hypercholesterolemia; Ovarian cyst, left; Rash and nonspecific skin eruption; Prolapsed lumbar disc; Inflammation of sacroiliac joint (HCC); Bilateral sacroiliitis (HCC); BMI 40.0-44.9, adult (HCC); Bone spur of foot; Cervical spondylitis (HCC); Depression, major, single episode, mild (HCC); Chronic bilateral low back pain with sciatica; Lumbar facet arthropathy; Chronic pain syndrome; Neuropathic pain; Encounter for therapeutic drug monitoring; Encounter for long-term opiate analgesic use; Bilateral foot pain; and Bilateral ankle pain on their problem list. who presents to clinic for treatment of myofascial/FM pain . They received a  Bilateral  trigger point injections as above.    PLAN: - Resume Usual Activities. Notify Physician of any unusual bleeding, erythema or concern for side effects as reviewed above. - Apply ice prn for pain - Tylenol prn for pain - Follow up as needed for trigger point injecitons; every 6 months for tramadol  - Follow up with Dr. Wynn Banker for burning thigh pain, consider ESI  Patient/Care Giver was ready to learn without apparent learning barriers. Education was provided on diagnosis, treatment options/plan according to patient's preferred learning style. Patient/Care Giver verbalized understanding and agreement with the above plan.

## 2022-07-24 ENCOUNTER — Encounter
Payer: Managed Care, Other (non HMO) | Attending: Physical Medicine and Rehabilitation | Admitting: Physical Medicine and Rehabilitation

## 2022-07-24 ENCOUNTER — Ambulatory Visit: Payer: Managed Care, Other (non HMO)

## 2022-07-24 ENCOUNTER — Encounter: Payer: Self-pay | Admitting: Physical Medicine and Rehabilitation

## 2022-07-24 VITALS — BP 108/73 | HR 84 | Ht 61.0 in | Wt 227.0 lb

## 2022-07-24 DIAGNOSIS — M797 Fibromyalgia: Secondary | ICD-10-CM | POA: Diagnosis present

## 2022-07-24 DIAGNOSIS — G894 Chronic pain syndrome: Secondary | ICD-10-CM | POA: Insufficient documentation

## 2022-07-24 MED ORDER — TRIAMCINOLONE ACETONIDE 40 MG/ML IJ SUSP
5.0000 mg | Freq: Once | INTRAMUSCULAR | Status: AC
  Administered 2022-07-24: 5.2 mg via INTRAMUSCULAR

## 2022-07-24 MED ORDER — LIDOCAINE HCL 1 % IJ SOLN
6.0000 mL | Freq: Once | INTRAMUSCULAR | Status: AC
Start: 2022-07-24 — End: 2022-07-24
  Administered 2022-07-24: 6 mL via INTRADERMAL

## 2022-07-24 NOTE — Patient Instructions (Addendum)
-   Resume Usual Activities. Notify Physician of any unusual bleeding, erythema or concern for side effects as reviewed above. - Apply ice prn for pain - Tylenol prn for pain - Follow up as needed for trigger point injecitons; every 6 months for tramadol - Follow up with Dr. Wynn Banker for burning thigh pain, consider ESI

## 2022-07-31 ENCOUNTER — Ambulatory Visit
Admission: RE | Admit: 2022-07-31 | Discharge: 2022-07-31 | Disposition: A | Discharge status: Home / Self Care | Payer: Managed Care, Other (non HMO) | Source: Ambulatory Visit | Attending: Family Medicine | Admitting: Family Medicine

## 2022-07-31 DIAGNOSIS — Z1231 Encounter for screening mammogram for malignant neoplasm of breast: Secondary | ICD-10-CM

## 2022-08-09 ENCOUNTER — Other Ambulatory Visit (HOSPITAL_COMMUNITY): Payer: Self-pay | Admitting: Psychiatry

## 2022-08-09 ENCOUNTER — Encounter (HOSPITAL_COMMUNITY): Payer: Self-pay

## 2022-08-09 ENCOUNTER — Other Ambulatory Visit (HOSPITAL_COMMUNITY): Payer: Self-pay | Admitting: *Deleted

## 2022-08-09 ENCOUNTER — Telehealth (HOSPITAL_COMMUNITY): Payer: Managed Care, Other (non HMO) | Admitting: Psychiatry

## 2022-08-09 DIAGNOSIS — F330 Major depressive disorder, recurrent, mild: Secondary | ICD-10-CM

## 2022-08-09 MED ORDER — BUPROPION HCL ER (XL) 150 MG PO TB24
150.0000 mg | ORAL_TABLET | Freq: Every day | ORAL | 0 refills | Status: DC
Start: 2022-08-09 — End: 2022-08-14

## 2022-08-10 ENCOUNTER — Other Ambulatory Visit (HOSPITAL_COMMUNITY): Payer: Self-pay | Admitting: Psychiatry

## 2022-08-10 DIAGNOSIS — F330 Major depressive disorder, recurrent, mild: Secondary | ICD-10-CM

## 2022-08-12 ENCOUNTER — Ambulatory Visit (HOSPITAL_BASED_OUTPATIENT_CLINIC_OR_DEPARTMENT_OTHER): Payer: Managed Care, Other (non HMO) | Admitting: Family Medicine

## 2022-08-12 ENCOUNTER — Encounter (HOSPITAL_BASED_OUTPATIENT_CLINIC_OR_DEPARTMENT_OTHER): Payer: Self-pay | Admitting: Family Medicine

## 2022-08-12 DIAGNOSIS — R059 Cough, unspecified: Secondary | ICD-10-CM | POA: Diagnosis not present

## 2022-08-12 DIAGNOSIS — R21 Rash and other nonspecific skin eruption: Secondary | ICD-10-CM

## 2022-08-12 MED ORDER — CLOTRIMAZOLE 1 % EX CREA: 1.0000 | TOPICAL_CREAM | Freq: Two times a day (BID) | CUTANEOUS | 1 refills | Status: DC

## 2022-08-12 NOTE — Assessment & Plan Note (Signed)
This is been present for about 3 weeks, remains unchanged.  She has tried switching allergy medications without notable improvement in symptoms.  Denies any issues with sinus congestion, rhinorrhea, not aware of any significant postnasal drip.  She feels the cough is primarily triggered when she tries to take a quick/deep breath in.  Denies any problems with this in the past.  No new medications.  Denies having any fever, chills, sweats, sick contacts around time of symptom onset. On exam, patient is in no acute distress, cardiovascular exam with regular rate and rhythm, lungs clear to auscultation bilaterally. Uncertain etiology, given history, feel would be reasonable to proceed with specialty evaluation, can proceed with pulmonology referral initially.  It is possible that current symptoms could be related to vocal cord dysfunction, which would likely necessitate ENT evaluation, however we will begin with pulmonary evaluation

## 2022-08-12 NOTE — Assessment & Plan Note (Signed)
Patient reports having rash under skin fold of right side of abdominal pannus.  She reports that area has been red, itchy.  Not aware of any discharge or skin breakdown.  Has been utilizing Neosporin.  Has been trying to keep the area dry as well.  Does feel that symptoms were exacerbated by sweating and the area being wet. On exam, patient in no acute distress, vital signs stable.  Under right side of pannus, mild erythema noted in area of concern, no breaks in skin appreciated, no drainage, mild warmth. Suspect intertrigo based on history and exam.  Concern for possible fungal component as well given symptoms.  Recommend keeping area clean, dry, utilizing absorbent measures to help with maintaining dryness.  Given possible fungal component, will recommend utilizing topical clotrimazole twice daily for 2 to 4 weeks She does have appointment scheduled in the next few weeks, we will plan to monitor progress at next appointment

## 2022-08-12 NOTE — Progress Notes (Signed)
    Procedures performed today:    None.  Independent interpretation of notes and tests performed by another provider:   None.  Brief History, Exam, Impression, and Recommendations:    BP 118/75 (BP Location: Right Arm, Patient Position: Sitting, Cuff Size: Large)   Pulse 94   Ht 5\' 1"  (1.549 m)   Wt 223 lb (101.2 kg)   SpO2 100%   BMI 42.14 kg/m   Cough, unspecified type Assessment & Plan: This is been present for about 3 weeks, remains unchanged.  She has tried switching allergy medications without notable improvement in symptoms.  Denies any issues with sinus congestion, rhinorrhea, not aware of any significant postnasal drip.  She feels the cough is primarily triggered when she tries to take a quick/deep breath in.  Denies any problems with this in the past.  No new medications.  Denies having any fever, chills, sweats, sick contacts around time of symptom onset. On exam, patient is in no acute distress, cardiovascular exam with regular rate and rhythm, lungs clear to auscultation bilaterally. Uncertain etiology, given history, feel would be reasonable to proceed with specialty evaluation, can proceed with pulmonology referral initially.  It is possible that current symptoms could be related to vocal cord dysfunction, which would likely necessitate ENT evaluation, however we will begin with pulmonary evaluation  Orders: -     Ambulatory referral to Pulmonology  Rash Assessment & Plan: Patient reports having rash under skin fold of right side of abdominal pannus.  She reports that area has been red, itchy.  Not aware of any discharge or skin breakdown.  Has been utilizing Neosporin.  Has been trying to keep the area dry as well.  Does feel that symptoms were exacerbated by sweating and the area being wet. On exam, patient in no acute distress, vital signs stable.  Under right side of pannus, mild erythema noted in area of concern, no breaks in skin appreciated, no drainage, mild  warmth. Suspect intertrigo based on history and exam.  Concern for possible fungal component as well given symptoms.  Recommend keeping area clean, dry, utilizing absorbent measures to help with maintaining dryness.  Given possible fungal component, will recommend utilizing topical clotrimazole twice daily for 2 to 4 weeks She does have appointment scheduled in the next few weeks, we will plan to monitor progress at next appointment   Other orders -     Clotrimazole; Apply 1 Application topically 2 (two) times daily.  Dispense: 30 g; Refill: 1  Return if symptoms worsen or fail to improve.   ___________________________________________ Brigg Cape de [place], MD, ABFM, CAQSM Primary Care and Sports Medicine [facility]

## 2022-08-14 ENCOUNTER — Telehealth (HOSPITAL_COMMUNITY): Payer: Self-pay

## 2022-08-14 ENCOUNTER — Telehealth (HOSPITAL_COMMUNITY): Payer: 59 | Admitting: Psychiatry

## 2022-08-14 ENCOUNTER — Encounter (HOSPITAL_COMMUNITY): Payer: Self-pay | Admitting: Psychiatry

## 2022-08-14 ENCOUNTER — Other Ambulatory Visit (HOSPITAL_COMMUNITY): Payer: Self-pay | Admitting: Psychiatry

## 2022-08-14 VITALS — Wt 223.0 lb

## 2022-08-14 DIAGNOSIS — F330 Major depressive disorder, recurrent, mild: Secondary | ICD-10-CM

## 2022-08-14 DIAGNOSIS — F411 Generalized anxiety disorder: Secondary | ICD-10-CM

## 2022-08-14 MED ORDER — BUPROPION HCL ER (XL) 150 MG PO TB24: 150.0000 mg | ORAL_TABLET | Freq: Every day | ORAL | 0 refills | Status: DC

## 2022-08-14 NOTE — Telephone Encounter (Signed)
Done

## 2022-08-14 NOTE — Telephone Encounter (Signed)
Patient is calling because her insurance will not cover the 30 day supply of the Buproprion, she wants to know if you can just change it to a 90 day and [facility]. Please review and advise, thank you

## 2022-08-14 NOTE — Progress Notes (Signed)
[facility] MD Virtual Progress Note   Patient Location: Home Provider Location: Office  I connect with patient by video and verified that I am speaking with correct person by using two identifiers. I discussed the limitations of evaluation and management by telemedicine and the availability of in person appointments. I also discussed with the patient that there may be a patient responsible charge related to this service. The patient expressed understanding and agreed to proceed.  Kelsey Simmons [id number] 62 y.o.  08/14/2022 10:00 AM  History of Present Illness:  Patient is evaluated by video session.  She liked the Wellbutrin and she feels it is helping her mood, she is more motivated to do things.  Her energy level is improved.  Lately she is somewhat sad because her best friend died and she is keeping her best friend's granddaughter and taking care of her.  She continues to struggle with roadblock when she tried to talk to her grandson who lives in [place].  She admitted it will be eventually more difficult to contact him in the future due to long distance.  Her job is going well.  She is no longer taking Cymbalta.  She is seeing Dr. Dee Peru for her chronic pain.  Her sleep is okay.  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  She has a plan to visit her mother and August who lives in [place], she has no tremors, shakes or any EPS.  We also increased the Lamictal few months ago and so far she is tolerating without any side effects.  She has no rash, itching, tremors or shakes.  She like to try higher dose of Wellbutrin since she noticed improvement.  She is in therapy with her therapist Randa Evens however due to best friend sickness she was not able to see but tomorrow she had appointment coming up.  Patient lives with her husband and her daughter live close by.  She noticed no major anxiety or panic attack since medicine working and helping  her.  Past Psychiatric History: H/O drug use and brief psychiatric inpatient in mid 39s for passive suicidal thoughts.  No history of attempt.  H/O twice rehab program.  H/O marijuana, crack and cocaine use.  Tried Paxil at that time but never consistent with treatment and follow-up.  PCP tried Cymbalta up to 120 mg but caused diplopia. Never seen psychiatrist before. No h/o psychosis, mania.      Outpatient Encounter Medications as of 08/14/2022  Medication Sig   amLODipine (NORVASC) 5 MG tablet TAKE 1 TABLET DAILY   atorvastatin (LIPITOR) 20 MG tablet TAKE 1 TABLET DAILY   azelastine (ASTELIN) 0.1 % nasal spray Place 2 sprays into both nostrils 2 (two) times daily. Use in each nostril as directed   buPROPion (WELLBUTRIN XL) 150 MG 24 hr tablet Take 1 tablet (150 mg total) by mouth daily.   clotrimazole (LOTRIMIN) 1 % cream Apply 1 Application topically 2 (two) times daily.   gabapentin (NEURONTIN) 600 MG tablet TAKE 1 TABLET THREE TIMES A DAY   hydrochlorothiazide (HYDRODIURIL) 25 MG tablet TAKE 1 TABLET EVERY MORNING   ketorolac (TORADOL) 10 MG tablet Take 1 tablet (10 mg total) by mouth every 6 (six) hours as needed (pain).   lamoTRIgine (LAMICTAL) 100 MG tablet Take 1 tablet (100 mg total) by mouth daily.   MOUNJARO 5 MG/0.5ML Pen Inject 5 mg into the skin once a week.   SUMAtriptan (IMITREX) 100 MG tablet  Take 1 tablet (100 mg total) by mouth every 2 (two) hours as needed for migraine or headache. May repeat in 2 hours if headache persists or recurs.   traMADol (ULTRAM) 50 MG tablet TAKE 1 TABLET TWICE A DAY, TAKE ADDITIONAL 50 MG ONCE A DAY AS NEEDED FOR PAIN   No facility-administered encounter medications on file as of 08/14/2022.    Recent Results (from the past 2160 hour(s))  ToxAssure Select Plus     Status: None   Collection Time: 05/22/22  2:54 PM  Result Value Ref Range   Summary Note     Comment:  ==================================================================== ToxAssure Select,+Antidepr,UR ==================================================================== Test                             Result       Flag       Units  Drug Present   Tramadol                       >7463                   ng/mg creat   O-Desmethyltramadol            7104                    ng/mg creat   N-Desmethyltramadol            2494                    ng/mg creat    Source of tramadol is a prescription medication. O-desmethyltramadol    and N-desmethyltramadol are expected metabolites of tramadol.    Duloxetine                     PRESENT ==================================================================== Test                      Result    Flag   Units      Ref Range   Creatinine              67               mg/dL      >=47 ==================================================================== Declared Medications:  Medication list was not provided. ================================ ==================================== For clinical consultation, please call [phone]. ====================================================================   POC urinalysis dipstick     Status: None   Collection Time: 06/18/22  2:45 PM  Result Value Ref Range   Color, UA yellow yellow   Clarity, UA clear clear   Glucose, UA negative negative mg/dL   Bilirubin, UA negative negative   Ketones, POC UA negative negative mg/dL   Spec Grav, UA 6.578 4.696 - 1.025   Blood, UA negative negative   pH, UA 6.5 5.0 - 8.0   Protein Ur, POC negative negative mg/dL   Urobilinogen, UA 1.0 0.2 or 1.0 E.U./dL   Nitrite, UA Negative Negative   Leukocytes, UA Negative Negative     Psychiatric Specialty Exam: Physical Exam  Review of Systems  Weight 223 lb (101.2 kg).There is no height or weight on file to calculate BMI.  General Appearance: Casual  Eye Contact:  Good  Speech:  Clear and Coherent  Volume:  Normal  Mood:   Dysphoric  Affect:  Appropriate  Thought Process:  Goal Directed  Orientation:  Full (Time, Place, and Person)  Thought Content:  WDL  Suicidal Thoughts:  No  Homicidal Thoughts:  No  Memory:  Immediate;   Good Recent;   Good Remote;   Good  Judgement:  Good  Insight:  Present  Psychomotor Activity:  Normal  Concentration:  Concentration: Good and Attention Span: Good  Recall:  Good  Fund of Knowledge:  Good  Language:  Good  Akathisia:  No  Handed:  Right  AIMS (if indicated):     Assets:  Communication Skills Desire for Improvement Housing Resilience Social Support Talents/Skills Transportation  ADL's:  Intact  Cognition:  WNL  Sleep:  ok     Assessment/Plan: MDD (major depressive disorder), recurrent episode, mild (HCC) - Plan: buPROPion (WELLBUTRIN XL) 150 MG 24 hr tablet  GAD (generalized anxiety disorder)  Patient doing better with Wellbutrin and Lamictal.  She is no longer taking Cymbalta.  Discussed to try Wellbutrin XL 300 mg daily and keep the Lamictal 100 daily.  Patient like to have a 30-day supply to local pharmacy of Wellbutrin but then she need a 90-day prescription to her mail pharmacy.  She recently picked up Lamictal and does not need a new prescription at this time.  Patient promised to give [place] a call back when she needed a new prescription of 90-day supply of Wellbutrin and if she needed a 90-day supply of Lamictal to her mail pharmacy.  I encouraged to continue therapy with Mardene Celeste.  Follow-up in 3 months unless she needs an earlier appointment.  Discussed medication side effects and benefits.  Follow Up Instructions:     I discussed the assessment and treatment plan with the patient. The patient was provided an opportunity to ask questions and all were answered. The patient agreed with the plan and demonstrated an understanding of the instructions.   The patient was advised to call back or seek an in-person evaluation if the symptoms worsen or if the  condition fails to improve as anticipated.    Collaboration of Care: Other provider involved in patient's care AEB notes are available in epic to review.  Patient/Guardian was advised Release of Information must be obtained prior to any record release in order to collaborate their care with an outside provider. Patient/Guardian was advised if they have not already done so to contact the registration department to sign all necessary forms in order for [place] to release information regarding their care.   Consent: Patient/Guardian gives verbal consent for treatment and assignment of benefits for services provided during this visit. Patient/Guardian expressed understanding and agreed to proceed.     I provided 30 minutes of non face to face time during this encounter.  Note: This document was prepared by [employer] voice dictation technology and any errors that results from this process are unintentional.    Cleotis Nipper, MD 08/14/2022

## 2022-08-15 ENCOUNTER — Telehealth (HOSPITAL_COMMUNITY): Payer: Self-pay

## 2022-08-15 DIAGNOSIS — F330 Major depressive disorder, recurrent, mild: Secondary | ICD-10-CM

## 2022-08-15 MED ORDER — BUPROPION HCL ER (XL) 300 MG PO TB24: 300.0000 mg | ORAL_TABLET | Freq: Every day | ORAL | 0 refills | Status: DC

## 2022-08-15 NOTE — Telephone Encounter (Signed)
She can take Wellbutrin 150 mg 2 tablets a day to make it 300.  I had send the new prescription Wellbutrin XL 300 mg #90 to [facility] pharmacy [facility].

## 2022-08-15 NOTE — Telephone Encounter (Signed)
Patient picked up her Wellbutrin this morning and said the prescription was for 150 mg, she thought you were increasing to 300 mg. She was concerned because she had already gotten home with the medication and she can not return it. I advised patient that if you do in fact want to increase her dose then she would just need to take 2 a day instead of 1 and that the rx would last 45 days instead of 90. Please review and advise, thank you.   Also, she would like a 90 day order of the 300 mg sent to her mail order.

## 2022-08-19 ENCOUNTER — Other Ambulatory Visit (HOSPITAL_BASED_OUTPATIENT_CLINIC_OR_DEPARTMENT_OTHER): Payer: Self-pay | Admitting: Family Medicine

## 2022-08-29 ENCOUNTER — Encounter
Payer: Managed Care, Other (non HMO) | Attending: Physical Medicine and Rehabilitation | Admitting: Physical Medicine & Rehabilitation

## 2022-08-29 ENCOUNTER — Telehealth: Payer: Self-pay | Admitting: Physical Medicine and Rehabilitation

## 2022-08-29 ENCOUNTER — Encounter: Payer: Self-pay | Admitting: Physical Medicine & Rehabilitation

## 2022-08-29 DIAGNOSIS — M5416 Radiculopathy, lumbar region: Secondary | ICD-10-CM | POA: Diagnosis not present

## 2022-08-29 DIAGNOSIS — M25511 Pain in right shoulder: Secondary | ICD-10-CM | POA: Insufficient documentation

## 2022-08-29 DIAGNOSIS — G8929 Other chronic pain: Secondary | ICD-10-CM | POA: Insufficient documentation

## 2022-08-29 DIAGNOSIS — M7918 Myalgia, other site: Secondary | ICD-10-CM | POA: Diagnosis present

## 2022-08-29 DIAGNOSIS — G894 Chronic pain syndrome: Secondary | ICD-10-CM | POA: Diagnosis present

## 2022-08-29 MED ORDER — IOHEXOL 180 MG/ML  SOLN
3.0000 mL | Freq: Once | INTRAMUSCULAR | Status: AC
  Administered 2022-08-30: 3 mL

## 2022-08-29 MED ORDER — LIDOCAINE HCL (PF) 1 % IJ SOLN
4.0000 mL | Freq: Once | INTRAMUSCULAR | Status: AC
Start: 2022-08-29 — End: 2022-08-30
  Administered 2022-08-30: 4 mL

## 2022-08-29 MED ORDER — LIDOCAINE HCL 1 % IJ SOLN
5.0000 mL | Freq: Once | INTRAMUSCULAR | Status: AC
Start: 2022-08-29 — End: 2022-08-30
  Administered 2022-08-30: 5 mL

## 2022-08-29 MED ORDER — DEXAMETHASONE SODIUM PHOSPHATE 10 MG/ML IJ SOLN
20.0000 mg | Freq: Once | INTRAMUSCULAR | Status: AC
Start: 2022-08-29 — End: 2022-08-30
  Administered 2022-08-30: 20 mg

## 2022-08-29 NOTE — Progress Notes (Signed)
Bilateral S1 transforaminal epidural steroid injection under fluoroscopic guidance with contrast enhancement  Indication: Lumbosacral radiculitis is not relieved by medication management or other conservative care and interfering with self-care and mobility. Reviewed last MRI from 2019 demonstrating Bilateral S1 and Right L5 impingement .  Prior ESI at another office with good relief  Informed consent was obtained after describing risk and benefits of the procedure with the patient, this includes bleeding, bruising, infection, paralysis and medication side effects.  The patient wishes to proceed and has given written consent.  Patient was placed in prone position.  The lumbar area was marked and prepped with Betadine.  It was entered with a 25-gauge 1-1/2 inch needle and one mL of 1% lidocaine was injected into the skin and subcutaneous tissue.  Then a 22-gauge 5" spinal needle was inserted into the left  S1 sacral foramen under AP, lateral, and oblique view.  Once needle tip was within the foramen on lateral views an dnor exceeding 6 o clock position on th epedical on AP viewed Isovue 200 was injected x 2ml Then a solution containing one mL of 10 mg per mL dexamethasone and 2 mL of 1% lidocaine was injected.  The patient tolerated procedure well.  This same procedure was performed on the right side with same needle , injectate and technique.  Post procedure instructions were given.  Please see post procedure form.   Pre injection back pain 8/10 Post injection back pain 1/10

## 2022-08-29 NOTE — Progress Notes (Signed)
  PROCEDURE RECORD [facility]   Name: KATURAH SULEK DOB:1960/11/10 MRN: [id number]  Date:08/29/2022  Physician: Claudette Laws, MD    Nurse/CMA:     Allergies:  Allergies  Allergen Reactions   Topiramate     Other reaction(s): double vision   Chlorhexidine Gluconate Rash   Tape Rash    DERMABOND    Consent Signed: Yes.    Is patient diabetic? Yes.    CBG today? 97  Pregnant: No. LMP: No LMP recorded. Patient has had a hysterectomy. (age 5-55)  Anticoagulants: no Anti-inflammatory: no Antibiotics: no  Procedure: Bilateral Epidural Steroid injection  Position: Prone Start Time: 1:47  End Time: 2:05  Fluoro Time: 1:10  RN/CMA Catalina Gravel    Time 1:36 2;08    BP 122/84 97/64    Pulse 73 74    Respirations 16 16    O2 Sat 98 92    S/S 6 6    Pain Level 8 1     D/C home with Spouse, patient A & O X 3, D/C instructions reviewed, and sits independently.

## 2022-08-29 NOTE — Patient Instructions (Signed)

## 2022-08-29 NOTE — Telephone Encounter (Signed)
The patient was asking about her trigger point injections. She said you told her to do them as needed but she wasn't sure if she should do them again. She said they didn't really help and she's still having some pain in her neck. What would you recommend for her?

## 2022-08-30 DIAGNOSIS — M5416 Radiculopathy, lumbar region: Secondary | ICD-10-CM | POA: Diagnosis not present

## 2022-09-02 ENCOUNTER — Other Ambulatory Visit (HOSPITAL_BASED_OUTPATIENT_CLINIC_OR_DEPARTMENT_OTHER): Payer: Self-pay

## 2022-09-02 DIAGNOSIS — E119 Type 2 diabetes mellitus without complications: Secondary | ICD-10-CM

## 2022-09-02 NOTE — Telephone Encounter (Signed)
Patient informed of Dr. Marina Gravel advice. She would like an appointment with Riley Lam NP.

## 2022-09-03 ENCOUNTER — Encounter (HOSPITAL_BASED_OUTPATIENT_CLINIC_OR_DEPARTMENT_OTHER): Payer: Self-pay

## 2022-09-03 ENCOUNTER — Ambulatory Visit (HOSPITAL_BASED_OUTPATIENT_CLINIC_OR_DEPARTMENT_OTHER): Payer: Managed Care, Other (non HMO) | Admitting: Family Medicine

## 2022-09-04 ENCOUNTER — Encounter: Payer: Self-pay | Admitting: Physical Medicine and Rehabilitation

## 2022-09-04 ENCOUNTER — Encounter: Payer: Managed Care, Other (non HMO) | Admitting: Physical Medicine and Rehabilitation

## 2022-09-04 VITALS — BP 119/78 | HR 75 | Ht 61.0 in | Wt 219.6 lb

## 2022-09-04 DIAGNOSIS — M5416 Radiculopathy, lumbar region: Secondary | ICD-10-CM | POA: Diagnosis not present

## 2022-09-04 DIAGNOSIS — M25511 Pain in right shoulder: Secondary | ICD-10-CM

## 2022-09-04 DIAGNOSIS — M7918 Myalgia, other site: Secondary | ICD-10-CM | POA: Diagnosis not present

## 2022-09-04 DIAGNOSIS — G894 Chronic pain syndrome: Secondary | ICD-10-CM | POA: Diagnosis not present

## 2022-09-04 DIAGNOSIS — G8929 Other chronic pain: Secondary | ICD-10-CM | POA: Insufficient documentation

## 2022-09-04 MED ORDER — METHOCARBAMOL 750 MG PO TABS: 750.0000 mg | ORAL_TABLET | Freq: Three times a day (TID) | ORAL | 0 refills | Status: DC | PRN

## 2022-09-04 NOTE — Progress Notes (Signed)
Subjective:    Patient ID: Kelsey Simmons, female    DOB: 1960/11/26, 62 y.o.   MRN: [id number]  HPI  Simmons Kelsey is a 62 y.o. year old female  who  has a past medical history of Allergy, Anxiety, Arthritis, Bilateral sacroiliitis (HCC), Chronic back pain, Depression (2020), Diabetes mellitus without complication (HCC), Fibromyalgia, Headache(784.0), History of kidney stones (2020), Hypercholesterolemia, Hypertension, Lumbar spondylosis, Lumbosacral neuritis, Myofascial pain, Paresthesia of lower extremity, Sleep apnea, and Trochanteric bursitis.   They are presenting to PM&R clinic for follow up related to worsening right shoulder pain since last trigger point injections.  Interval Hx:  - Therapies: R shoulder has been very painful for awhile. Has been going on for at least 1-2 years. No imaging, joint injections, acute injury that she is aware of.   - Follow ups: Saw Dr. Wynn Banker for S1 ESI; has done very well with it, states that her symptoms are not totally resolved but the stinging and sharpness associated with the pain in her hips has significantly decreased.  She is extremely happy with the results from the procedure.  It is allowed her to be more functionally independent, tolerate standing and walking for longer periods of time.   - Medications: TPI last time worked for 1-2 days; then wore off.      Pain Inventory Average Pain 5 Pain Right Now 9 My pain is sharp and stabbing  In the last 24 hours, has pain interfered with the following? General activity 8 Relation with others 2 Enjoyment of life 5 What TIME of day is your pain at its worst? daytime Sleep (in general) Good  Pain is worse with: sitting Pain improves with: rest, heat/ice, and medication Relief from Meds: 5  Family History  Problem Relation Age of Onset   Diabetes Mellitus II Mother    Lupus Mother    Arthritis Mother    Diabetes Mother    Hypertension Mother    Miscarriages / [place] Mother     Lung cancer Father        smoker/worked in factory   Alcohol abuse Father    Cancer Father        smoker, worked in Marine scientist   Early death Father    Hypertension Father    Diabetes Mellitus II Sister    Arthritis Sister    Diabetes Sister    Hypertension Sister    Obesity Sister    Diabetes Mellitus II Brother    Hypercholesterolemia Brother    Diabetes Brother    Cancer Maternal Grandmother        Either uterine or endometrial   Colon cancer Neg Hx    Breast cancer Neg Hx    Social History   Socioeconomic History   Marital status: Married    Spouse name: Lucious   Number of children: 2   Years of education: Not on file   Highest education level: Some college, no degree  Occupational History   Not on file  Tobacco Use   Smoking status: Never   Smokeless tobacco: Never  Vaping Use   Vaping status: Never Used  Substance and Sexual Activity   Alcohol use: Not Currently    Comment: quit 1994   Drug use: Not Currently    Types: "Crack" cocaine    Comment: quit 1994   Sexual activity: Not Currently    Birth control/protection: Post-menopausal  Other Topics Concern   Not on file  Social History Narrative   Lives with  husband   Caffeine- 11 oz c daily   Social Determinants of Health   Financial Resource Strain: Low Risk  (07/05/2022)   Overall Financial Resource Strain (CARDIA)    Difficulty of Paying Living Expenses: Not hard at all  Food Insecurity: No Food Insecurity (07/05/2022)   Hunger Vital Sign    Worried About Running Out of Food in the Last Year: Never true    Ran Out of Food in the Last Year: Never true  Transportation Needs: No Transportation Needs (07/05/2022)   PRAPARE - Administrator, Civil Service (Medical): No    Lack of Transportation (Non-Medical): No  Physical Activity: Unknown (07/05/2022)   Exercise Vital Sign    Days of Exercise per Week: 0 days    Minutes of Exercise per Session: Not on file  Stress: Stress Concern Present  (07/05/2022)   [employer] of Occupational Health - Occupational Stress Questionnaire    Feeling of Stress : Rather much  Social Connections: Moderately Isolated (07/05/2022)   Social Connection and Isolation Panel [NHANES]    Frequency of Communication with Friends and Family: More than three times a week    Frequency of Social Gatherings with Friends and Family: Three times a week    Attends Religious Services: Never    Active Member of Clubs or Organizations: No    Attends Engineer, structural: Not on file    Marital Status: Married   Past Surgical History:  Procedure Laterality Date   ABDOMINAL HYSTERECTOMY  1997   BUNIONECTOMY  2012   CESAREAN SECTION     GASTRIC BYPASS  2006   JOINT REPLACEMENT     RTKA 7'15   ROBOTIC ASSISTED LAPAROSCOPIC LYSIS OF ADHESION N/A 04/19/2019   Procedure: XI ROBOTIC ASSISTED LAPAROSCOPIC LYSIS OF ADHESION;  Surgeon: Carver Fila, MD;  Location: [facility];  Service: Gynecology;  Laterality: N/A;   ROBOTIC ASSISTED SALPINGO OOPHERECTOMY Left 04/19/2019   Procedure: XI ROBOTIC ASSISTED SALPINGO OOPHORECTOMY;  Surgeon: Carver Fila, MD;  Location: [facility];  Service: Gynecology;  Laterality: Left;   TOTAL KNEE ARTHROPLASTY Right 08/18/2013   Procedure: RIGHT TOTAL KNEE ARTHROPLASTY;  Surgeon: Jacki Cones, MD;  Location: [facility];  Service: Orthopedics;  Laterality: Right;   TOTAL KNEE ARTHROPLASTY Left 04/13/2014   Procedure: LEFT TOTAL KNEE ARTHROPLASTY;  Surgeon: Ranee Gosselin, MD;  Location: [facility];  Service: Orthopedics;  Laterality: Left;   VENTRAL HERNIA REPAIR N/A 01/11/2019   Procedure: LAPAROSCOPIC VENTRAL HERNIA REPAIR WITH MESH;  Surgeon: Diamantina Monks, MD;  Location: [facility];  Service: General;  Laterality: N/A;   Past Surgical History:  Procedure Laterality Date   ABDOMINAL HYSTERECTOMY  1997   BUNIONECTOMY  2012   CESAREAN SECTION     GASTRIC BYPASS  2006   JOINT REPLACEMENT     RTKA 7'15   ROBOTIC ASSISTED  LAPAROSCOPIC LYSIS OF ADHESION N/A 04/19/2019   Procedure: XI ROBOTIC ASSISTED LAPAROSCOPIC LYSIS OF ADHESION;  Surgeon: Carver Fila, MD;  Location: [facility];  Service: Gynecology;  Laterality: N/A;   ROBOTIC ASSISTED SALPINGO OOPHERECTOMY Left 04/19/2019   Procedure: XI ROBOTIC ASSISTED SALPINGO OOPHORECTOMY;  Surgeon: Carver Fila, MD;  Location: [facility];  Service: Gynecology;  Laterality: Left;   TOTAL KNEE ARTHROPLASTY Right 08/18/2013   Procedure: RIGHT TOTAL KNEE ARTHROPLASTY;  Surgeon: Jacki Cones, MD;  Location: [facility];  Service: Orthopedics;  Laterality: Right;   TOTAL KNEE ARTHROPLASTY Left 04/13/2014   Procedure: LEFT  TOTAL KNEE ARTHROPLASTY;  Surgeon: Ranee Gosselin, MD;  Location: [facility];  Service: Orthopedics;  Laterality: Left;   VENTRAL HERNIA REPAIR N/A 01/11/2019   Procedure: LAPAROSCOPIC VENTRAL HERNIA REPAIR WITH MESH;  Surgeon: Diamantina Monks, MD;  Location: [facility];  Service: General;  Laterality: N/A;   Past Medical History:  Diagnosis Date   Allergy    Anxiety    Arthritis    Bilateral sacroiliitis (HCC)    Chronic back pain    Depression 2020   in therapy but not on medications   Diabetes mellitus without complication (HCC)    type 2    Fibromyalgia    Headache(784.0)    HX MIGRAINES   History of kidney stones 2020   left parenchymal stone identified on CT 11/23/18   kidney cyst   Hypercholesterolemia    Hypertension    Lumbar spondylosis    Lumbosacral neuritis    Myofascial pain    Paresthesia of lower extremity    Sleep apnea    HAS NOT USED C PAP SINCE 2007 DUE TO WT LOSS    Trochanteric bursitis    left hip   BP 119/78   Pulse 75   Ht 5\' 1"  (1.549 m)   Wt 219 lb 9.6 oz (99.6 kg)   SpO2 92%   BMI 41.49 kg/m   Opioid Risk Score:   Fall Risk Score:  `1  Depression screen [facility] 2/9     09/04/2022    4:36 PM 08/12/2022    3:50 PM 07/24/2022   10:02 AM 07/08/2022    2:37 PM 05/22/2022    2:35 PM 04/17/2022   11:15 AM 04/15/2022     1:44 PM  Depression screen PHQ 2/9  Decreased Interest 3 0 1 0 1 1   Down, Depressed, Hopeless 3 0 1 0 1 1   PHQ - 2 Score 6 0 2 0 2 2   Altered sleeping  0  0     Tired, decreased energy  0  0     Change in appetite  0  0     Feeling bad or failure about yourself     0     Trouble concentrating  0  0     Moving slowly or fidgety/restless  0  0     Suicidal thoughts  0  0     PHQ-9 Score  0  0     Difficult doing work/chores  Not difficult at all  Not difficult at all        Information is confidential and restricted. Go to Review Flowsheets to unlock data.     Review of Systems  Constitutional: Negative.   HENT: Negative.    Eyes: Negative.   Respiratory: Negative.    Cardiovascular: Negative.   Gastrointestinal: Negative.   Endocrine: Negative.   Genitourinary: Negative.   Musculoskeletal:  Positive for back pain.       Upper back area   Skin: Negative.   Allergic/Immunologic: Negative.   Neurological: Negative.   Hematological: Negative.   Psychiatric/Behavioral:  Positive for dysphoric mood.   All other systems reviewed and are negative.      Objective:   Physical Exam   PE: Constitution: Appropriate appearance for age. No apparent distress  +Obese Resp: No respiratory distress. No accessory muscle usage. on RA Cardio: Well perfused appearance.  Abdomen: Nondistended. Nontender.   Psych: Appropriate mood and affect. Neuro: AAOx4. No apparent cognitive deficits   Neurologic Exam:  Motor exam: strength 5/5 throughout bilateral upper extremities and bilateral lower extremities Coordination: Fine motor coordination was normal.      Shoulder Exam: Righjt Inspection: Inspection of shoulder revealed no atrophy or gross abnormality. There is no medial winging of the scapula, step off sign or muscle atrophy noted around the shoulder girdle.  Palpatory exam:  Palpation along the cervical musculature, clavicle, AC joint, anterior and posterior GH joint, Biceps  tendon within the bicipital groove, deltoid, supraspinatus and infraspinatus were examined. Palpation of shoulder revealed TTP over R medial scapula and over anterior AC joint.  There was no muscle spasm noted or trigger points noted.   ROM: Active range of motion within normal limits in shoulder flexion, abduction.  Limited compared to the left in internal rotation and extension. Strength: 5 /5 strength when testing the shoulder internal / external rotators, supraspinatus, biceps, deltoid.  Special Tests: Supraspinatus tests: Empty can  - ; Drop arm test  - Resisted deltoid test:  - Impingement signs: Hawkins  +; Neers- AC joint: Cross arm adduction -  Biceps tests: Yergasons - Subscapularis tests: Gerber liftoff  +; Belly test .- Slap lesions: Obriens  - Instability tests: Apprehension +;       Assessment & Plan:   AGIGAIL NORDINE is a 62 y.o. year old female  who  has a past medical history of Allergy, Anxiety, Arthritis, Bilateral sacroiliitis (HCC), Chronic back pain, Depression (2020), Diabetes mellitus without complication (HCC), Fibromyalgia, Headache(784.0), History of kidney stones (2020), Hypercholesterolemia, Hypertension, Lumbar spondylosis, Lumbosacral neuritis, Myofascial pain, Paresthesia of lower extremity, Sleep apnea, and Trochanteric bursitis.   They are presenting to PM&R clinic as follow up for treatment of right shoulder pain and myofascial pain.  Chronic pain syndrome Chronic right shoulder pain -     DG Shoulder Right; Future Will get x-ray of the right shoulder to assess for arthritis given decreased range of motion in internal rotation; will relay results to patient and could consider joint injection based on findings.  Myofascial pain No benefit with trigger point injections, can repeat as needed per patient preference only  The pain and spasms in the right shoulder, sent temporary prescription of Robaxin 750 mg every 8 hours as needed; advised patient to  start with dosing at night to assess tolerability of fatigue side effects, and to not drive while on medication.  Other orders -     Methocarbamol; Take 1 tablet (750 mg total) by mouth every 8 (eight) hours as needed for muscle spasms.  Dispense: 45 tablet; Refill: 0

## 2022-09-12 ENCOUNTER — Encounter (HOSPITAL_BASED_OUTPATIENT_CLINIC_OR_DEPARTMENT_OTHER): Payer: Self-pay | Admitting: Family Medicine

## 2022-09-12 ENCOUNTER — Ambulatory Visit (HOSPITAL_BASED_OUTPATIENT_CLINIC_OR_DEPARTMENT_OTHER): Payer: Managed Care, Other (non HMO) | Admitting: Family Medicine

## 2022-09-12 ENCOUNTER — Encounter: Payer: Self-pay | Admitting: Optometry

## 2022-09-12 VITALS — BP 114/76 | HR 72 | Ht 60.98 in | Wt 218.9 lb

## 2022-09-12 DIAGNOSIS — Z7985 Long-term (current) use of injectable non-insulin antidiabetic drugs: Secondary | ICD-10-CM

## 2022-09-12 DIAGNOSIS — E119 Type 2 diabetes mellitus without complications: Secondary | ICD-10-CM

## 2022-09-12 LAB — HM DIABETES EYE EXAM

## 2022-09-12 MED ORDER — BLOOD GLUCOSE TEST VI STRP
1.0000 | ORAL_STRIP | Freq: Three times a day (TID) | 2 refills | Status: DC
Start: 2022-09-12 — End: 2023-08-14

## 2022-09-12 MED ORDER — LANCETS MISC. MISC
1.0000 | Freq: Three times a day (TID) | 1 refills | Status: AC
Start: 2022-09-12 — End: 2022-10-12

## 2022-09-12 MED ORDER — BLOOD GLUCOSE MONITORING SUPPL DEVI
1.0000 | Freq: Every day | 0 refills | Status: DC
Start: 2022-09-12 — End: 2022-10-29

## 2022-09-12 MED ORDER — LANCET DEVICE MISC
1.0000 | Freq: Three times a day (TID) | 1 refills | Status: AC
Start: 2022-09-12 — End: 2022-10-12

## 2022-09-12 NOTE — Progress Notes (Signed)
    Procedures performed today:    None.  Independent interpretation of notes and tests performed by another provider:   None.  Brief History, Exam, Impression, and Recommendations:    BP 114/76   Pulse 72   Ht 5' 0.98" (1.549 m)   Wt 218 lb 14.4 oz (99.3 kg)   SpO2 100%   BMI 41.38 kg/m   Diabetes mellitus without complication (HCC) Assessment & Plan: Recent hemoglobin A1c has been well-controlled.  She denies any new symptoms such as polyuria or polydipsia.  She continues with Mounjaro.  No reported GI issues with Mounjaro.  Recent urine microalbumin/creatinine ratio was normal which is reassuring.  Plan to recheck this again annually She is requesting to have new glucometer device.  She reports that she will use hers intermittently to monitor blood sugar, such as after meals to assess how meals may have affected her blood sugar.  Discussed general considerations.  Given current status of diabetes and medication being utilized, she would not necessarily need to be monitoring blood sugar regularly as she is not utilizing insulin or medications for which we would need to know blood sugar in order to appropriately dose medication.  She is also not on medication which would significantly increase her risk of hyper or hypoglycemic events.  However, would be reasonable to have glucometer available in case she does develop any symptoms and would need to know her blood sugar to determine if very high or low readings contributing to any symptoms she is experiencing. Hemoglobin A1c checked last week and was stable at 6.6%, this remains at goal of less than 7.0%.  Plan to complete foot exam at next office visit  Orders: -     Blood Glucose Monitoring Suppl; 1 each by Does not apply route daily. May substitute to any manufacturer covered by patient's insurance.  Dispense: 1 each; Refill: 0 -     Blood Glucose Test; 1 each by In Vitro route in the morning, at noon, and at bedtime. May substitute to  any manufacturer covered by patient's insurance.  Dispense: 100 each; Refill: 2 -     Lancet Device; 1 each by Does not apply route in the morning, at noon, and at bedtime. May substitute to any manufacturer covered by patient's insurance.  Dispense: 1 each; Refill: 1 -     Lancets Misc.; 1 each by Does not apply route in the morning, at noon, and at bedtime. May substitute to any manufacturer covered by patient's insurance.  Dispense: 100 each; Refill: 1  Return in about 4 months (around 01/12/2023) for diabetes.   ___________________________________________ Masayoshi Couzens de [place], MD, ABFM, CAQSM Primary Care and Sports Medicine [facility]

## 2022-09-12 NOTE — Assessment & Plan Note (Addendum)
Recent hemoglobin A1c has been well-controlled.  She denies any new symptoms such as polyuria or polydipsia.  She continues with Mounjaro.  No reported GI issues with Mounjaro.  Recent urine microalbumin/creatinine ratio was normal which is reassuring.  Plan to recheck this again annually She is requesting to have new glucometer device.  She reports that she will use hers intermittently to monitor blood sugar, such as after meals to assess how meals may have affected her blood sugar.  Discussed general considerations.  Given current status of diabetes and medication being utilized, she would not necessarily need to be monitoring blood sugar regularly as she is not utilizing insulin or medications for which we would need to know blood sugar in order to appropriately dose medication.  She is also not on medication which would significantly increase her risk of hyper or hypoglycemic events.  However, would be reasonable to have glucometer available in case she does develop any symptoms and would need to know her blood sugar to determine if very high or low readings contributing to any symptoms she is experiencing. Hemoglobin A1c checked last week and was stable at 6.6%, this remains at goal of less than 7.0%.  Plan to complete foot exam at next office visit

## 2022-09-25 ENCOUNTER — Other Ambulatory Visit (HOSPITAL_BASED_OUTPATIENT_CLINIC_OR_DEPARTMENT_OTHER): Payer: Self-pay

## 2022-09-25 MED ORDER — BLOOD GLUCOSE MONITORING SUPPL DEVI: 1.0000 | Freq: Three times a day (TID) | 0 refills | Status: AC

## 2022-10-08 ENCOUNTER — Ambulatory Visit (HOSPITAL_BASED_OUTPATIENT_CLINIC_OR_DEPARTMENT_OTHER): Payer: Managed Care, Other (non HMO) | Admitting: Family Medicine

## 2022-10-09 ENCOUNTER — Telehealth: Payer: Self-pay | Admitting: Physical Medicine & Rehabilitation

## 2022-10-09 NOTE — Telephone Encounter (Signed)
Patient wanting to see if she can get another injection.  She said the injection worked well., but she said she may have overdone it and the left side is hurting again.

## 2022-10-10 NOTE — Telephone Encounter (Signed)
Patient aware.

## 2022-10-11 LAB — FECAL OCCULT BLOOD, IMMUNOCHEMICAL: IFOBT: POSITIVE

## 2022-10-15 ENCOUNTER — Ambulatory Visit (INDEPENDENT_AMBULATORY_CARE_PROVIDER_SITE_OTHER): Payer: Managed Care, Other (non HMO) | Admitting: Podiatry

## 2022-10-15 DIAGNOSIS — M2011 Hallux valgus (acquired), right foot: Secondary | ICD-10-CM

## 2022-10-15 DIAGNOSIS — M2041 Other hammer toe(s) (acquired), right foot: Secondary | ICD-10-CM

## 2022-10-15 DIAGNOSIS — M2042 Other hammer toe(s) (acquired), left foot: Secondary | ICD-10-CM

## 2022-10-15 DIAGNOSIS — E119 Type 2 diabetes mellitus without complications: Secondary | ICD-10-CM

## 2022-10-15 DIAGNOSIS — E1142 Type 2 diabetes mellitus with diabetic polyneuropathy: Secondary | ICD-10-CM

## 2022-10-15 DIAGNOSIS — M2012 Hallux valgus (acquired), left foot: Secondary | ICD-10-CM

## 2022-10-16 ENCOUNTER — Ambulatory Visit
Admission: RE | Admit: 2022-10-16 | Discharge: 2022-10-16 | Disposition: A | Discharge status: Home / Self Care | Payer: Managed Care, Other (non HMO) | Source: Ambulatory Visit | Attending: Physical Medicine and Rehabilitation | Admitting: Physical Medicine and Rehabilitation

## 2022-10-16 ENCOUNTER — Encounter: Payer: Self-pay | Admitting: Podiatry

## 2022-10-16 DIAGNOSIS — G8929 Other chronic pain: Secondary | ICD-10-CM

## 2022-10-16 NOTE — Progress Notes (Signed)
**Note Kelsey-Identified via Obfuscation** ANNUAL DIABETIC FOOT EXAM  Subjective: Kelsey Simmons presents today annual diabetic foot exam.  Chief Complaint  Patient presents with   Diabetes    DF EXAM YEARLY    Patient confirms h/o diabetes.  Patient denies any h/o foot wounds.   Patient endorses symptoms of foot tingling.  Kelsey Simmons, Buren Kos, MD is patient's PCP. LOV was 09/12/2022.  Past Medical History:  Diagnosis Date   Allergy    Anxiety    Arthritis    Bilateral sacroiliitis (HCC)    Chronic back pain    Depression 2020   in therapy but not on medications   Diabetes mellitus without complication (HCC)    type 2    Fibromyalgia    Headache(784.0)    HX MIGRAINES   History of kidney stones 2020   left parenchymal stone identified on CT 11/23/18   kidney cyst   Hypercholesterolemia    Hypertension    Lumbar spondylosis    Lumbosacral neuritis    Myofascial pain    Paresthesia of lower extremity    Sleep apnea    HAS NOT USED C PAP SINCE 2007 DUE TO WT LOSS    Trochanteric bursitis    left hip   Patient Active Problem List   Diagnosis Date Noted   Chronic right shoulder pain 09/04/2022   Myofascial pain 09/04/2022   Cough 08/12/2022   Bilateral knee pain 07/08/2022   Bilateral foot pain 04/04/2022   Bilateral ankle pain 04/04/2022   Encounter for therapeutic drug monitoring 02/21/2022   Encounter for long-term opiate analgesic use 02/21/2022   Chronic bilateral low back pain with sciatica 12/10/2021   Lumbar facet arthropathy 12/10/2021   Chronic pain syndrome 12/10/2021   Neuropathic pain 12/10/2021   Depression, major, single episode, mild (HCC) 11/15/2021   Rash 04/13/2021   Bone spur of foot 07/31/2020   Cervical spondylitis (HCC) 03/02/2020   BMI 40.0-44.9, adult (HCC) 07/06/2019   Ovarian cyst, left 04/09/2019   Exposure to severe acute respiratory syndrome coronavirus 2 (SARS-CoV-2) 02/13/2019   Abdominal pain 02/09/2019   Acquired spondylolisthesis 02/09/2019   Allergic rhinitis  02/09/2019   Constipation 02/09/2019   Fibrocystic breast changes 02/09/2019   Fibromyalgia 02/09/2019   Gastroesophageal reflux disease without esophagitis 02/09/2019   Iron deficiency anemia 02/09/2019   Migraine with aura 02/09/2019   Pure hypercholesterolemia 02/09/2019   Bilateral sacroiliitis (HCC) 01/26/2019   Diabetes mellitus without complication (HCC) 09/22/2018   Hypertension 04/12/2015   History of total knee arthroplasty 04/13/2014   Prolapsed lumbar disc 10/14/2013   Osteoarthritis of right knee 08/18/2013   Hx of total knee arthroplasty 08/18/2013   Inflammation of sacroiliac joint (HCC) 11/09/2012   Past Surgical History:  Procedure Laterality Date   ABDOMINAL HYSTERECTOMY  1997   BUNIONECTOMY  2012   CESAREAN SECTION     GASTRIC BYPASS  2006   JOINT REPLACEMENT     RTKA 7'15   ROBOTIC ASSISTED LAPAROSCOPIC LYSIS OF ADHESION N/A 04/19/2019   Procedure: XI ROBOTIC ASSISTED LAPAROSCOPIC LYSIS OF ADHESION;  Surgeon: Carver Fila, MD;  Location: [facility];  Service: Gynecology;  Laterality: N/A;   ROBOTIC ASSISTED SALPINGO OOPHERECTOMY Left 04/19/2019   Procedure: XI ROBOTIC ASSISTED SALPINGO OOPHORECTOMY;  Surgeon: Carver Fila, MD;  Location: [facility];  Service: Gynecology;  Laterality: Left;   TOTAL KNEE ARTHROPLASTY Right 08/18/2013   Procedure: RIGHT TOTAL KNEE ARTHROPLASTY;  Surgeon: Jacki Cones, MD;  Location: [facility];  Service: Orthopedics;  Laterality:  Right;   TOTAL KNEE ARTHROPLASTY Left 04/13/2014   Procedure: LEFT TOTAL KNEE ARTHROPLASTY;  Surgeon: Ranee Gosselin, MD;  Location: [facility];  Service: Orthopedics;  Laterality: Left;   VENTRAL HERNIA REPAIR N/A 01/11/2019   Procedure: LAPAROSCOPIC VENTRAL HERNIA REPAIR WITH MESH;  Surgeon: Diamantina Monks, MD;  Location: [facility];  Service: General;  Laterality: N/A;   Current Outpatient Medications on File Prior to Visit  Medication Sig Dispense Refill   amLODipine (NORVASC) 5 MG tablet TAKE 1 TABLET  DAILY 90 tablet 3   atorvastatin (LIPITOR) 20 MG tablet TAKE 1 TABLET DAILY 90 tablet 3   azelastine (ASTELIN) 0.1 % nasal spray Place 2 sprays into both nostrils 2 (two) times daily. Use in each nostril as directed 30 mL 2   Blood Glucose Monitoring Suppl DEVI 1 each by Does not apply route daily. May substitute to any manufacturer covered by patient's insurance. 1 each 0   Blood Glucose Monitoring Suppl DEVI 1 each by Does not apply route in the morning, at noon, and at bedtime. One Touch Verio Flex per patients insurance 1 each 0   buPROPion (WELLBUTRIN XL) 300 MG 24 hr tablet Take 1 tablet (300 mg total) by mouth daily. 90 tablet 0   clotrimazole (LOTRIMIN) 1 % cream Apply 1 Application topically 2 (two) times daily. 30 g 1   gabapentin (NEURONTIN) 600 MG tablet TAKE 1 TABLET THREE TIMES A DAY 270 tablet 3   Glucose Blood (BLOOD GLUCOSE TEST STRIPS) STRP 1 each by In Vitro route in the morning, at noon, and at bedtime. May substitute to any manufacturer covered by patient's insurance. 100 each 2   hydrochlorothiazide (HYDRODIURIL) 25 MG tablet TAKE 1 TABLET EVERY MORNING 90 tablet 3   lamoTRIgine (LAMICTAL) 100 MG tablet Take 1 tablet (100 mg total) by mouth daily. 90 tablet 0   methocarbamol (ROBAXIN-750) 750 MG tablet Take 1 tablet (750 mg total) by mouth every 8 (eight) hours as needed for muscle spasms. 45 tablet 0   MOUNJARO 5 MG/0.5ML Pen INJECT 5 MG UNDER THE SKIN WEEKLY 6 mL 3   SUMAtriptan (IMITREX) 100 MG tablet Take 1 tablet (100 mg total) by mouth every 2 (two) hours as needed for migraine or headache. May repeat in 2 hours if headache persists or recurs. 10 tablet 5   traMADol (ULTRAM) 50 MG tablet TAKE 1 TABLET TWICE A DAY, TAKE ADDITIONAL 50 MG ONCE A DAY AS NEEDED FOR PAIN 90 tablet 2   No current facility-administered medications on file prior to visit.    Allergies  Allergen Reactions   Topiramate     Other reaction(s): double vision   Chlorhexidine Gluconate Rash   Tape  Rash    DERMABOND   Social History   Occupational History   Not on file  Tobacco Use   Smoking status: Never   Smokeless tobacco: Never  Vaping Use   Vaping status: Never Used  Substance and Sexual Activity   Alcohol use: Not Currently    Comment: quit 1994   Drug use: Not Currently    Types: "Crack" cocaine    Comment: quit 1994   Sexual activity: Not Currently    Birth control/protection: Post-menopausal   Family History  Problem Relation Age of Onset   Diabetes Mellitus II Mother    Lupus Mother    Arthritis Mother    Diabetes Mother    Hypertension Mother    Miscarriages / [place] Mother    Lung cancer Father  smoker/worked in factory   Alcohol abuse Father    Cancer Father        smoker, worked in Marine scientist   Early death Father    Hypertension Father    Diabetes Mellitus II Sister    Arthritis Sister    Diabetes Sister    Hypertension Sister    Obesity Sister    Diabetes Mellitus II Brother    Hypercholesterolemia Brother    Diabetes Brother    Cancer Maternal Grandmother        Either uterine or endometrial   Colon cancer Neg Hx    Breast cancer Neg Hx    Immunization History  Administered Date(s) Administered   Influenza Split 11/11/2014, 10/05/2019   Influenza,inj,Quad PF,6+ Mos 10/15/2021   Influenza,inj,quad, With Preservative 11/19/2018   Influenza-Unspecified 11/10/2015, 11/01/2016, 10/29/2017   PFIZER(Purple Top)SARS-COV-2 Vaccination 04/30/2019, 05/28/2019, 12/06/2019, 06/02/2020   Pneumococcal Conjugate PCV 7 10/18/2019   Tdap 07/04/2020   Varicella 10/18/2019   Zoster Recombinant(Shingrix) 10/18/2019, 02/01/2020     Review of Systems: Negative except as noted in the HPI.   Objective: There were no vitals filed for this visit.  Kelsey Simmons is a pleasant 62 y.o. female in NAD. AAO X 3.  Vascular Examination: Capillary refill time immediate b/l. Vascular status intact b/l with palpable pedal pulses. Pedal hair present b/l.  No pain with calf compression b/l. Skin temperature gradient WNL b/l. No cyanosis or clubbing b/l. No ischemia or gangrene noted b/l.   Neurological Examination: Sensation grossly intact b/l with 10 gram monofilament. Vibratory sensation intact b/l. Pt has subjective symptoms of neuropathy.  Dermatological Examination: Pedal skin with normal turgor, texture and tone b/l.  No open wounds. No interdigital macerations.   Toenails 1-5 b/l well maintained with adequate length. No erythema, no edema, no drainage, no fluctuance. No corns, calluses nor porokeratotic lesions noted.  Musculoskeletal Examination: Normal muscle strength 5/5 to all lower extremity muscle groups bilaterally. No pain, crepitus or joint limitation noted with ROM b/l LE. No gross bony pedal deformities b/l. Patient ambulates independently without assistive aids.  Radiographs: None  Last A1c:      Latest Ref Rng & Units 09/04/2022    8:37 AM 04/24/2022    3:19 PM 11/13/2021    2:08 PM  Hemoglobin A1C  Hemoglobin-A1c 4.8 - 5.6 % 6.6  6.6  6.5     ADA Risk Categorization: Low Risk :  Patient has all of the following: Intact protective sensation No prior foot ulcer  No severe deformity Pedal pulses present  Assessment: 1. Hallux valgus, acquired, bilateral   2. Acquired hammertoes of both feet   3. Diabetic peripheral neuropathy associated with type 2 diabetes mellitus (HCC)   4. Encounter for diabetic foot exam (HCC)     Plan: -Consent given for treatment as described below: -Examined patient. -Diabetic foot examination performed today. -Continue diabetic foot care principles: inspect feet daily, monitor glucose as recommended by PCP and/or Endocrinologist, and follow prescribed diet per PCP, Endocrinologist and/or dietician. -Patient to continue soft, supportive shoe gear daily. -Patient/POA to call should there be question/concern in the interim. Return in about 1 year (around 10/15/2023).  Freddie Breech, DPM

## 2022-10-20 ENCOUNTER — Other Ambulatory Visit: Payer: Self-pay | Admitting: Physical Medicine and Rehabilitation

## 2022-10-20 ENCOUNTER — Other Ambulatory Visit (HOSPITAL_COMMUNITY): Payer: Self-pay | Admitting: Psychiatry

## 2022-10-20 DIAGNOSIS — M797 Fibromyalgia: Secondary | ICD-10-CM

## 2022-10-20 DIAGNOSIS — F411 Generalized anxiety disorder: Secondary | ICD-10-CM

## 2022-10-20 DIAGNOSIS — F330 Major depressive disorder, recurrent, mild: Secondary | ICD-10-CM

## 2022-10-25 ENCOUNTER — Other Ambulatory Visit: Payer: Self-pay | Admitting: Family Medicine

## 2022-10-25 ENCOUNTER — Encounter (HOSPITAL_BASED_OUTPATIENT_CLINIC_OR_DEPARTMENT_OTHER): Payer: Self-pay | Admitting: Family Medicine

## 2022-10-25 DIAGNOSIS — R195 Other fecal abnormalities: Secondary | ICD-10-CM

## 2022-10-25 DIAGNOSIS — Z1212 Encounter for screening for malignant neoplasm of rectum: Secondary | ICD-10-CM

## 2022-10-25 DIAGNOSIS — Z1211 Encounter for screening for malignant neoplasm of colon: Secondary | ICD-10-CM

## 2022-10-28 ENCOUNTER — Encounter (HOSPITAL_BASED_OUTPATIENT_CLINIC_OR_DEPARTMENT_OTHER): Payer: Self-pay | Admitting: *Deleted

## 2022-10-29 ENCOUNTER — Ambulatory Visit (HOSPITAL_BASED_OUTPATIENT_CLINIC_OR_DEPARTMENT_OTHER): Payer: Managed Care, Other (non HMO)

## 2022-10-29 ENCOUNTER — Encounter (HOSPITAL_BASED_OUTPATIENT_CLINIC_OR_DEPARTMENT_OTHER): Payer: Self-pay | Admitting: Pulmonary Disease

## 2022-10-29 ENCOUNTER — Ambulatory Visit (HOSPITAL_BASED_OUTPATIENT_CLINIC_OR_DEPARTMENT_OTHER): Payer: Managed Care, Other (non HMO) | Admitting: Pulmonary Disease

## 2022-10-29 VITALS — BP 112/70 | HR 80 | Ht 61.0 in | Wt 215.0 lb

## 2022-10-29 DIAGNOSIS — R059 Cough, unspecified: Secondary | ICD-10-CM

## 2022-10-29 MED ORDER — ALBUTEROL SULFATE HFA 108 (90 BASE) MCG/ACT IN AERS: 2.0000 | INHALATION_SPRAY | Freq: Four times a day (QID) | RESPIRATORY_TRACT | 2 refills | Status: DC | PRN

## 2022-10-29 MED ORDER — IPRATROPIUM BROMIDE 0.03 % NA SOLN: 2.0000 | Freq: Two times a day (BID) | NASAL | 5 refills | Status: DC

## 2022-10-29 NOTE — Patient Instructions (Signed)
Chronic cough --START atrovent nasal spray twice a day --START albuterol TWO puffs in the morning and evening and as needed --ORDER pulmonary function tests. If normal, consider reflux treatment --ORDER CXR

## 2022-10-29 NOTE — Progress Notes (Signed)
Subjective:   PATIENT ID: Kelsey Simmons GENDER: female DOB: [birth year], MRN: [id number]  Chief Complaint  Patient presents with   Consult   Cough    Reason for Visit: New consult for chronic cough  [name] is a 62 year old female with HTN, DM2, HLD, OSA not on CPAP who presents for evaluation for chronic cough.  For the >2 years she has nonproductive cough that occurs episodically for weeks to months. Seems to occur at anytime of the year. Has nasal congestion that is not seasonal either. Takes azelastin, allegra. Previously on flonase. None of the allergy meds seem to be effective. Denies wheezing. Cough occurs with deep inhalation, talking and eating. Reports cough at night when she has a tickle but symptoms are not worse at night. Denies environmental triggers.   Social History: Currently office work  Environmental exposures: None  I have personally reviewed patient's past medical/family/social history, allergies, current medications.  Past Medical History:  Diagnosis Date   Allergy    Anxiety    Arthritis    Bilateral sacroiliitis (HCC)    Chronic back pain    Depression 2020   in therapy but not on medications   Diabetes mellitus without complication (HCC)    type 2    Fibromyalgia    Headache(784.0)    HX MIGRAINES   History of kidney stones 2020   left parenchymal stone identified on CT 11/23/18   kidney cyst   Hypercholesterolemia    Hypertension    Lumbar spondylosis    Lumbosacral neuritis    Myofascial pain    Paresthesia of lower extremity    Sleep apnea    HAS NOT USED C PAP SINCE 2007 DUE TO WT LOSS    Trochanteric bursitis    left hip     Family History  Problem Relation Age of Onset   Diabetes Mellitus II Mother    Lupus Mother    Arthritis Mother    Diabetes Mother    Hypertension Mother    Miscarriages / [place] Mother    Lung cancer Father        smoker/worked in factory   Alcohol abuse Father    Cancer Father         smoker, worked in Marine scientist   Early death Father    Hypertension Father    Diabetes Mellitus II Sister    Arthritis Sister    Diabetes Sister    Hypertension Sister    Obesity Sister    Diabetes Mellitus II Brother    Hypercholesterolemia Brother    Diabetes Brother    Cancer Maternal Grandmother        Either uterine or endometrial   Colon cancer Neg Hx    Breast cancer Neg Hx      Social History   Occupational History   Not on file  Tobacco Use   Smoking status: Never   Smokeless tobacco: Never  Vaping Use   Vaping status: Never Used  Substance and Sexual Activity   Alcohol use: Not Currently    Comment: quit 1994   Drug use: Not Currently    Types: "Crack" cocaine    Comment: quit 1994   Sexual activity: Not Currently    Birth control/protection: Post-menopausal    Allergies  Allergen Reactions   Topiramate     Other reaction(s): double vision   Chlorhexidine Gluconate Rash   Tape Rash    DERMABOND     Outpatient Medications Prior  to Visit  Medication Sig Dispense Refill   amLODipine (NORVASC) 5 MG tablet TAKE 1 TABLET DAILY 90 tablet 3   atorvastatin (LIPITOR) 20 MG tablet TAKE 1 TABLET DAILY 90 tablet 3   azelastine (ASTELIN) 0.1 % nasal spray Place 2 sprays into both nostrils 2 (two) times daily. Use in each nostril as directed 30 mL 2   Blood Glucose Monitoring Suppl DEVI 1 each by Does not apply route in the morning, at noon, and at bedtime. One Touch Verio Flex per patients insurance 1 each 0   buPROPion (WELLBUTRIN XL) 300 MG 24 hr tablet Take 1 tablet (300 mg total) by mouth daily. 90 tablet 0   clotrimazole (LOTRIMIN) 1 % cream Apply 1 Application topically 2 (two) times daily. 30 g 1   gabapentin (NEURONTIN) 600 MG tablet TAKE 1 TABLET THREE TIMES A DAY 270 tablet 3   Glucose Blood (BLOOD GLUCOSE TEST STRIPS) STRP 1 each by In Vitro route in the morning, at noon, and at bedtime. May substitute to any manufacturer covered by patient's insurance. 100  each 2   hydrochlorothiazide (HYDRODIURIL) 25 MG tablet TAKE 1 TABLET EVERY MORNING 90 tablet 3   lamoTRIgine (LAMICTAL) 100 MG tablet Take 1 tablet (100 mg total) by mouth daily. 90 tablet 0   methocarbamol (ROBAXIN-750) 750 MG tablet Take 1 tablet (750 mg total) by mouth every 8 (eight) hours as needed for muscle spasms. 45 tablet 0   MOUNJARO 5 MG/0.5ML Pen INJECT 5 MG UNDER THE SKIN WEEKLY 6 mL 3   SUMAtriptan (IMITREX) 100 MG tablet Take 1 tablet (100 mg total) by mouth every 2 (two) hours as needed for migraine or headache. May repeat in 2 hours if headache persists or recurs. 10 tablet 5   traMADol (ULTRAM) 50 MG tablet TAKE 1 TABLET TWICE A DAY, TAKE ADDITIONAL 50 MG ONCE A DAY AS NEEDED FOR PAIN 90 tablet 2   Blood Glucose Monitoring Suppl DEVI 1 each by Does not apply route daily. May substitute to any manufacturer covered by patient's insurance. 1 each 0   No facility-administered medications prior to visit.    Review of Systems  Constitutional:  Negative for chills, diaphoresis, fever, malaise/fatigue and weight loss.  HENT:  Negative for congestion.   Respiratory:  Negative for cough, hemoptysis, sputum production, shortness of breath and wheezing.   Cardiovascular:  Negative for chest pain, palpitations and leg swelling.     Objective:   Vitals:   10/29/22 1519  BP: 112/70  Pulse: 80  SpO2: 97%  Weight: 215 lb (97.5 kg)  Height: 5\' 1"  (1.549 m)   SpO2: 97 %  Physical Exam: General: Well-appearing, no acute distress HENT: [place], AT Eyes: EOMI, no scleral icterus Respiratory: Clear to auscultation bilaterally.  No crackles, wheezing or rales Cardiovascular: RRR, -M/R/G, no JVD Extremities:-Edema,-tenderness Neuro: AAO x4, CNII-XII grossly intact Psych: Normal mood, normal affect  Data Reviewed:  Imaging: CT A/P 11/23/18 - lower lung fields normal  PFT: None on file  Labs: CBC    Component Value Date/Time   WBC 6.2 04/12/2021 1042   RBC 4.89 04/12/2021  1042   HGB 14.2 04/12/2021 1042   HCT 44.1 04/12/2021 1042   PLT 244.0 04/12/2021 1042   MCV 90.1 04/12/2021 1042   MCH 28.7 04/14/2019 1336   MCHC 32.2 04/12/2021 1042   RDW 14.4 04/12/2021 1042   LYMPHSABS 1.8 04/12/2021 1042   MONOABS 0.5 04/12/2021 1042   EOSABS 0.1 04/12/2021 1042   BASOSABS  0.1 04/12/2021 1042   Absolute 04/13/22 100     Assessment & Plan:   Discussion: 62 year old female with HTN, DM2, HLD, OSA not on CPAP who presents for evaluation for chronic cough.  Common causes of cough were discussed including upper airway cough syndrome, reflux and undiagnosed obstructive lung disease. We reviewed evaluation and management for cough as noted below.  Chronic cough --START atrovent nasal spray twice a day --START albuterol TWO puffs in the morning and evening and as needed --ORDER pulmonary function tests. If normal, consider reflux treatment --ORDER CXR  Hx OSA --Consider home sleep study in the future if patient is interested  Health Maintenance Immunization History  Administered Date(s) Administered   Influenza Split 11/11/2014, 10/05/2019   Influenza,inj,Quad PF,6+ Mos 10/15/2021   Influenza,inj,quad, With Preservative 11/19/2018   Influenza-Unspecified 11/10/2015, 11/01/2016, 10/29/2017   PFIZER(Purple Top)SARS-COV-2 Vaccination 04/30/2019, 05/28/2019, 12/06/2019, 06/02/2020   Pneumococcal Conjugate PCV 7 10/18/2019   Tdap 07/04/2020   Varicella 10/18/2019   Zoster Recombinant(Shingrix) 10/18/2019, 02/01/2020   CT Lung Screen - never smoker  Orders Placed This Encounter  Procedures   DG Chest 2 View    Standing Status:   Future    Standing Expiration Date:   10/29/2023    Order Specific Question:   Reason for Exam (SYMPTOM  OR DIAGNOSIS REQUIRED)    Answer:   cough    Order Specific Question:   Is patient pregnant?    Answer:   No    Order Specific Question:   Preferred imaging location?    Answer:   [facility]   Meds ordered this  encounter  Medications   ipratropium (ATROVENT) 0.03 % nasal spray    Sig: Place 2 sprays into both nostrils 2 (two) times daily.    Dispense:  30 mL    Refill:  5   albuterol (VENTOLIN HFA) 108 (90 Base) MCG/ACT inhaler    Sig: Inhale 2 puffs into the lungs every 6 (six) hours as needed for wheezing or shortness of breath.    Dispense:  8 g    Refill:  2    Return in about 2 months (around 12/29/2022).  I have spent a total time of 45-minutes on the day of the appointment reviewing prior documentation, coordinating care and discussing medical diagnosis and plan with the patient/family. Imaging, labs and tests included in this note have been reviewed and interpreted independently by me.  Sanja Elizardo Mechele Collin, MD [facility] 10/29/2022 3:44 PM  Office Number [phone]

## 2022-10-30 ENCOUNTER — Other Ambulatory Visit (HOSPITAL_BASED_OUTPATIENT_CLINIC_OR_DEPARTMENT_OTHER): Payer: Self-pay

## 2022-10-30 ENCOUNTER — Encounter (HOSPITAL_COMMUNITY): Payer: Self-pay

## 2022-10-30 MED ORDER — ALBUTEROL SULFATE HFA 108 (90 BASE) MCG/ACT IN AERS: 2.0000 | INHALATION_SPRAY | Freq: Four times a day (QID) | RESPIRATORY_TRACT | 1 refills | Status: AC | PRN

## 2022-10-30 MED ORDER — IPRATROPIUM BROMIDE 0.03 % NA SOLN: 2.0000 | Freq: Two times a day (BID) | NASAL | 1 refills | Status: AC

## 2022-10-31 ENCOUNTER — Other Ambulatory Visit (HOSPITAL_COMMUNITY): Payer: Self-pay

## 2022-10-31 DIAGNOSIS — F411 Generalized anxiety disorder: Secondary | ICD-10-CM

## 2022-10-31 DIAGNOSIS — F330 Major depressive disorder, recurrent, mild: Secondary | ICD-10-CM

## 2022-10-31 MED ORDER — LAMOTRIGINE 100 MG PO TABS: 100.0000 mg | ORAL_TABLET | Freq: Every day | ORAL | 0 refills | Status: DC

## 2022-11-05 ENCOUNTER — Encounter: Payer: Self-pay | Admitting: Physical Medicine and Rehabilitation

## 2022-11-06 ENCOUNTER — Encounter: Payer: Self-pay | Admitting: Gastroenterology

## 2022-11-06 NOTE — Telephone Encounter (Signed)
Pt advised that since fit test was already complete to follow through with gastro referral as even if the cologuard was done and was positive it would not tell what it was positive for. She has already set up an appointment through gastro office and will keep this appointment

## 2022-11-07 ENCOUNTER — Other Ambulatory Visit (HOSPITAL_COMMUNITY): Payer: Self-pay | Admitting: *Deleted

## 2022-11-07 ENCOUNTER — Telehealth (HOSPITAL_COMMUNITY): Payer: Self-pay | Admitting: *Deleted

## 2022-11-07 DIAGNOSIS — F330 Major depressive disorder, recurrent, mild: Secondary | ICD-10-CM

## 2022-11-07 MED ORDER — BUPROPION HCL ER (XL) 150 MG PO TB24: 150.0000 mg | ORAL_TABLET | ORAL | 0 refills | Status: DC

## 2022-11-07 NOTE — Telephone Encounter (Signed)
Pt called with c/o feeling that she is having s/e from the Wellbutrin. Pt says she has been having "weakness" in her legs and says that her balance has been off, no falls. Pt also says she can't get up from her chair anymore. Pt is still currently taking the Wellbutrin XL 300 mg every day. Pt wanted to consult you first before going to her PCP. Pt last seen on 08/14/22 and has a f/u scheduled for 11/20/22. Please review and advise.

## 2022-11-07 NOTE — Telephone Encounter (Signed)
Pt agrees. Rx bridge sent to pharmacy.

## 2022-11-07 NOTE — Telephone Encounter (Signed)
She is taking Wellbutrin for a while and reported it helped.  One of the option is to cut down the dose from 300 mg to 150 mg.  If she agree then please call her pharmacy.

## 2022-11-13 ENCOUNTER — Encounter: Payer: Self-pay | Admitting: Physical Medicine and Rehabilitation

## 2022-11-13 ENCOUNTER — Telehealth (HOSPITAL_COMMUNITY): Payer: 59 | Admitting: Psychiatry

## 2022-11-13 ENCOUNTER — Encounter
Payer: Managed Care, Other (non HMO) | Attending: Physical Medicine and Rehabilitation | Admitting: Physical Medicine and Rehabilitation

## 2022-11-13 VITALS — BP 124/68 | HR 71 | Ht 61.0 in | Wt 216.6 lb

## 2022-11-13 DIAGNOSIS — M7541 Impingement syndrome of right shoulder: Secondary | ICD-10-CM | POA: Diagnosis present

## 2022-11-13 DIAGNOSIS — M25511 Pain in right shoulder: Secondary | ICD-10-CM | POA: Insufficient documentation

## 2022-11-13 DIAGNOSIS — G8929 Other chronic pain: Secondary | ICD-10-CM

## 2022-11-13 DIAGNOSIS — M19011 Primary osteoarthritis, right shoulder: Secondary | ICD-10-CM

## 2022-11-13 MED ORDER — LIDOCAINE HCL 1 % IJ SOLN
5.0000 mL | Freq: Once | INTRAMUSCULAR | Status: AC
Start: 2022-11-13 — End: 2022-11-13
  Administered 2022-11-13: 5 mL

## 2022-11-13 MED ORDER — TRIAMCINOLONE ACETONIDE 40 MG/ML IJ SUSP
40.0000 mg | Freq: Once | INTRAMUSCULAR | Status: AC
Start: 2022-11-13 — End: 2022-11-13
  Administered 2022-11-13: 40 mg via INTRA_ARTICULAR

## 2022-11-13 NOTE — Progress Notes (Signed)
HPI: Kelsey Simmons is a 61 y.o. female with PMHx has Osteoarthritis of right knee; Hx of total knee arthroplasty; History of total knee arthroplasty; Exposure to severe acute respiratory syndrome coronavirus 2 (SARS-CoV-2); Abdominal pain; Acquired spondylolisthesis; Allergic rhinitis; Constipation; Diabetes mellitus without complication (HCC); Fibrocystic breast changes; Fibromyalgia; Gastroesophageal reflux disease without esophagitis; Hypertension; Iron deficiency anemia; Migraine with aura; Pure hypercholesterolemia; Ovarian cyst, left; Rash; Prolapsed lumbar disc; Inflammation of sacroiliac joint (HCC); Bilateral sacroiliitis (HCC); BMI 40.0-44.9, adult (HCC); Bone spur of foot; Cervical spondylitis (HCC); Depression, major, single episode, mild (HCC); Chronic bilateral low back pain with sciatica; Lumbar facet arthropathy; Chronic pain syndrome; Neuropathic pain; Encounter for therapeutic drug monitoring; Encounter for long-term opiate analgesic use; Bilateral foot pain; Bilateral ankle pain; Bilateral knee pain; Cough; Chronic right shoulder pain; and Myofascial pain on their problem list. who presents to clinic for treatment of pain related to right shoulder pain  via injection as described below.    No new concerns or complaints. No major changes in medical history since last visit. Discussed exam as below; had planned for glenohumeral injection only but given + impingement exam will split injection into each to see if this is effective for her symptoms. She is agreeable.   Physical Exam:  General: Appropriate appearance for age.  Mental Status: Appropriate mood and affect.  Cardiovascular: RRR, no m/r/g.  Respiratory: CTAB, no rales/rhonchi/wheezing.  Skin: No apparent rashes or lesions.  Neuro: Awake, alert, and oriented x3. No apparent deficits.  MSK:  Moving all 4 limbs antigravity and against resistance.  RUE + Hawkin's, apprehension, empty can, and gerber lift off; - [employer]  and empty can MOST painful  PROCEDURE:  Right   glenohumeral and subacromial bursa injection Diagnosis:    ICD-10-CM   1. Chronic right shoulder pain  M25.511 triamcinolone acetonide (KENALOG-40) injection 40 mg   G89.29 lidocaine (XYLOCAINE) 1 % (with pres) injection 5 mL    2. Arthritis of right shoulder region  M19.011     3. Impingement syndrome of right shoulder  M75.41       Goals with treatment: [ x ] Decrease pain [ x ] Improve Active / Passive ROM [ x ] Improve ADLs [ x ] Improve functional mobility  MEDICATION:  [ x ] Kenalog 40 mg/mL  [ X ] Lidocaine 1%    CONSENT: Obtained in writing per policy. Consent uploaded to chart.  Benefits discussed.  Risks discussed included, but were not limited to, pain and discomfort, bleeding, bruising, allergic reaction, infection. All questions answered to patient/family member/guardian/ caregiver satisfaction. They would like to proceed with procedure. There are no noted contraindications to procedure.  PROCEDURE Time out was preformed No heat sources No antibiotics  The patient was explained about both the benefits and risks of a Right  shoulder injection. After the patient acknowledged an understanding of the risks and benefits, the patient agreed to proceed. The area was first marked and then prepped in an aseptic fashion with betadine / alcohol. A 25 g, 3 inch needle was directed via a posteriolateral approach into the Right  glenohumeral joint. The injection was completed with Kenalog 40 mg/ml 1 cc mixed with 3 cc of 1% lidocaine after no blood was aspirated on pull back. The same needle was then directed into the subdeltoid bursa in the same plane, with 2 cc injection of the same mixture. Pre- and post-ultrasound images were taken.   No complications were encountered. The patient tolerated the procedure well.  Impression: HPI: Kelsey Simmons is a 62 y.o. female with PMHx has Osteoarthritis of right knee; Hx of total knee  arthroplasty; History of total knee arthroplasty; Exposure to severe acute respiratory syndrome coronavirus 2 (SARS-CoV-2); Abdominal pain; Acquired spondylolisthesis; Allergic rhinitis; Constipation; Diabetes mellitus without complication (HCC); Fibrocystic breast changes; Fibromyalgia; Gastroesophageal reflux disease without esophagitis; Hypertension; Iron deficiency anemia; Migraine with aura; Pure hypercholesterolemia; Ovarian cyst, left; Rash; Prolapsed lumbar disc; Inflammation of sacroiliac joint (HCC); Bilateral sacroiliitis (HCC); BMI 40.0-44.9, adult (HCC); Bone spur of foot; Cervical spondylitis (HCC); Depression, major, single episode, mild (HCC); Chronic bilateral low back pain with sciatica; Lumbar facet arthropathy; Chronic pain syndrome; Neuropathic pain; Encounter for therapeutic drug monitoring; Encounter for long-term opiate analgesic use; Bilateral foot pain; Bilateral ankle pain; Bilateral knee pain; Cough; Chronic right shoulder pain; and Myofascial pain on their problem list. who presents to clinic for treatment of Right shoulder  pain . They received a  Right  shoulder injection (glenohumeral and subacromial bursa) as above.   PLAN: - Resume Usual Activities. Notify Physician of any unusual bleeding, erythema or concern for side effects as reviewed above. - Apply ice prn for pain - Tylenol prn for pain  Patient/Care [name] was ready to learn without apparent learning barriers. Education was provided on diagnosis, treatment options/plan according to patient's preferred learning style. Patient/Care Giver verbalized understanding and agreement with the above plan.   Angelina Sheriff, DO 11/13/2022

## 2022-11-18 DIAGNOSIS — M7541 Impingement syndrome of right shoulder: Secondary | ICD-10-CM | POA: Insufficient documentation

## 2022-11-18 DIAGNOSIS — M19011 Primary osteoarthritis, right shoulder: Secondary | ICD-10-CM | POA: Insufficient documentation

## 2022-11-20 ENCOUNTER — Telehealth (HOSPITAL_COMMUNITY): Payer: 59 | Admitting: Psychiatry

## 2022-11-20 ENCOUNTER — Encounter (HOSPITAL_COMMUNITY): Payer: Self-pay | Admitting: Psychiatry

## 2022-11-20 VITALS — Wt 216.0 lb

## 2022-11-20 DIAGNOSIS — F411 Generalized anxiety disorder: Secondary | ICD-10-CM

## 2022-11-20 DIAGNOSIS — F330 Major depressive disorder, recurrent, mild: Secondary | ICD-10-CM | POA: Diagnosis not present

## 2022-11-20 MED ORDER — LAMOTRIGINE 100 MG PO TABS
100.0000 mg | ORAL_TABLET | Freq: Every day | ORAL | 2 refills | Status: DC
Start: 2022-11-20 — End: 2023-03-06

## 2022-11-20 MED ORDER — BUPROPION HCL ER (XL) 150 MG PO TB24
150.0000 mg | ORAL_TABLET | ORAL | 2 refills | Status: DC
Start: 2022-11-20 — End: 2022-12-25

## 2022-11-20 NOTE — Progress Notes (Signed)
[facility] MD Virtual Progress Note   Patient Location: Home  Provider Location: Office  I connect with patient by video and verified that I am speaking with correct person by using two identifiers. I discussed the limitations of evaluation and management by telemedicine and the availability of in person appointments. I also discussed with the patient that there may be a patient responsible charge related to this service. The patient expressed understanding and agreed to proceed.  Kelsey Simmons [id number] 62 y.o.  11/20/2022 1:10 PM  History of Present Illness:  Patient is evaluated by video session.  She is now back on Wellbutrin XL 150 as could not tolerate 300.  She started to have weakness, tremors.  She is comfortable with 150.  She reported things are going okay but lately find out that her job description change.  She is moved to a different department and she is not happy.  Patient told they do micromanagement and she cannot take breaks without informing them.  Now she is applying for a new job.  She admitted not able to talk to grandson as much because most of the time his cell phone is dead.  But overall she is handling her anxiety better than before.  She is also seeing pain management.  She denies any crying spells or any feeling of hopelessness or worthlessness.  She has no more tremors, shakes or any EPS.  She is in therapy with Randa Evens.  Patient lives with her husband.  Her daughter live close by.  She wants to keep the current dose of Wellbutrin.  Recently she had a blood work and her hemoglobin A1c remains 6.6 stable.  Past Psychiatric History: H/O drug use and brief psychiatric inpatient in mid 18s for passive suicidal thoughts.  No history of attempt.  H/O twice rehab program.  H/O marijuana, crack and cocaine use.  Tried Paxil at that time but never consistent with treatment and follow-up.  PCP tried Cymbalta up to 120 mg but caused diplopia. Never seen  psychiatrist before. No h/o psychosis, mania.    Outpatient Encounter Medications as of 11/20/2022  Medication Sig   albuterol (VENTOLIN HFA) 108 (90 Base) MCG/ACT inhaler Inhale 2 puffs into the lungs every 6 (six) hours as needed for wheezing or shortness of breath.   amLODipine (NORVASC) 5 MG tablet TAKE 1 TABLET DAILY   atorvastatin (LIPITOR) 20 MG tablet TAKE 1 TABLET DAILY   azelastine (ASTELIN) 0.1 % nasal spray Place 2 sprays into both nostrils 2 (two) times daily. Use in each nostril as directed   Blood Glucose Monitoring Suppl DEVI 1 each by Does not apply route in the morning, at noon, and at bedtime. One Touch Verio Flex per patients insurance   buPROPion (WELLBUTRIN XL) 150 MG 24 hr tablet Take 1 tablet (150 mg total) by mouth every morning.   clotrimazole (LOTRIMIN) 1 % cream Apply 1 Application topically 2 (two) times daily.   gabapentin (NEURONTIN) 600 MG tablet TAKE 1 TABLET THREE TIMES A DAY   Glucose Blood (BLOOD GLUCOSE TEST STRIPS) STRP 1 each by In Vitro route in the morning, at noon, and at bedtime. May substitute to any manufacturer covered by patient's insurance.   hydrochlorothiazide (HYDRODIURIL) 25 MG tablet TAKE 1 TABLET EVERY MORNING   ipratropium (ATROVENT) 0.03 % nasal spray Place 2 sprays into both nostrils 2 (two) times daily.   lamoTRIgine (LAMICTAL) 100 MG tablet Take 1 tablet (100 mg total) by mouth daily. Bridge to pt appt  10/30   methocarbamol (ROBAXIN-750) 750 MG tablet Take 1 tablet (750 mg total) by mouth every 8 (eight) hours as needed for muscle spasms.   MOUNJARO 5 MG/0.5ML Pen INJECT 5 MG UNDER THE SKIN WEEKLY   SUMAtriptan (IMITREX) 100 MG tablet Take 1 tablet (100 mg total) by mouth every 2 (two) hours as needed for migraine or headache. May repeat in 2 hours if headache persists or recurs.   traMADol (ULTRAM) 50 MG tablet TAKE 1 TABLET TWICE A DAY, TAKE ADDITIONAL 50 MG ONCE A DAY AS NEEDED FOR PAIN   No facility-administered encounter  medications on file as of 11/20/2022.    Recent Results (from the past 2160 hour(s))  Urine Microalbumin w/creat. ratio     Status: None   Collection Time: 09/04/22  8:37 AM  Result Value Ref Range   Creatinine, Urine 259.7 Not Estab. mg/dL   Microalbumin, Urine 40.9 Not Estab. ug/mL   Microalb/Creat Ratio 8 0 - 29 mg/g creat    Comment:                        Normal:                0 -  29                        Moderately increased: 30 - 300                        Severely increased:       >300   Hemoglobin A1c     Status: Abnormal   Collection Time: 09/04/22  8:37 AM  Result Value Ref Range   Hgb A1c MFr Bld 6.6 (H) 4.8 - 5.6 %    Comment:          Prediabetes: 5.7 - 6.4          Diabetes: >6.4          Glycemic control for adults with diabetes: <7.0    Est. average glucose Bld gHb Est-mCnc 143 mg/dL  HM DIABETES EYE EXAM     Status: None   Collection Time: 09/12/22  2:42 PM  Result Value Ref Range   HM Diabetic Eye Exam No Retinopathy No Retinopathy  Fecal occult blood, imunochemical     Status: None   Collection Time: 10/11/22 12:00 AM  Result Value Ref Range   IFOBT Positive      Psychiatric Specialty Exam: Physical Exam  Review of Systems  Weight 216 lb (98 kg).There is no height or weight on file to calculate BMI.  General Appearance: Casual  Eye Contact:  Good  Speech:  Clear and Coherent and Normal Rate  Volume:  Normal  Mood:  Anxious  Affect:  Appropriate  Thought Process:  Goal Directed  Orientation:  Full (Time, Place, and Person)  Thought Content:  Logical  Suicidal Thoughts:  No  Homicidal Thoughts:  No  Memory:  Immediate;   Good Recent;   Good Remote;   Good  Judgement:  Good  Insight:  Good  Psychomotor Activity:  Normal  Concentration:  Concentration: Good and Attention Span: Good  Recall:  Good  Fund of Knowledge:  Good  Language:  Good  Akathisia:  No  Handed:  Right  AIMS (if indicated):     Assets:  Communication Skills Desire for  Improvement Housing Social Support Transportation  ADL's:  Intact  Cognition:  WNL  Sleep:  ok     Assessment/Plan: MDD (major depressive disorder), recurrent episode, mild (HCC) - Plan: buPROPion (WELLBUTRIN XL) 150 MG 24 hr tablet  GAD (generalized anxiety disorder) - Plan: buPROPion (WELLBUTRIN XL) 150 MG 24 hr tablet  Patient is taking Wellbutrin XL 150 mg and Lamictal 100 mg daily.  She could not tolerate 300 mg Wellbutrin.  Discussed medication side effects and benefits.  Recommended to call us back if she has any question or any concern.  Patient prefers local pharmacy because it is close by but do not provide a 90-day prescription.  She has no rash or any itching.  Recommend to keep the therapy.  Follow-up in 3 months.   Follow Up Instructions:     I discussed the assessment and treatment plan with the patient. The patient was provided an opportunity to ask questions and all were answered. The patient agreed with the plan and demonstrated an understanding of the instructions.   The patient was advised to call back or seek an in-person evaluation if the symptoms worsen or if the condition fails to improve as anticipated.    Collaboration of Care: Other provider involved in patient's care AEB notes are available in epic to review  Patient/Guardian was advised Release of Information must be obtained prior to any record release in order to collaborate their care with an outside provider. Patient/Guardian was advised if they have not already done so to contact the registration department to sign all necessary forms in order for [place] to release information regarding their care.   Consent: Patient/Guardian gives verbal consent for treatment and assignment of benefits for services provided during this visit. Patient/Guardian expressed understanding and agreed to proceed.     I provided 20 minutes of non face to face time during this encounter.  Note: This document was prepared by  [employer] voice dictation technology and any errors that results from this process are unintentional.    Cleotis Nipper, MD 11/20/2022

## 2022-11-22 ENCOUNTER — Ambulatory Visit: Payer: Managed Care, Other (non HMO) | Admitting: Registered Nurse

## 2022-11-25 ENCOUNTER — Ambulatory Visit: Payer: Managed Care, Other (non HMO) | Admitting: Dermatology

## 2022-11-28 ENCOUNTER — Ambulatory Visit (HOSPITAL_BASED_OUTPATIENT_CLINIC_OR_DEPARTMENT_OTHER): Payer: Managed Care, Other (non HMO) | Admitting: Pulmonary Disease

## 2022-11-28 DIAGNOSIS — R059 Cough, unspecified: Secondary | ICD-10-CM | POA: Diagnosis not present

## 2022-11-28 NOTE — Patient Instructions (Signed)
Full PFT Performed Today  

## 2022-11-28 NOTE — Progress Notes (Signed)
Full PFT Performed Today  

## 2022-11-29 ENCOUNTER — Encounter (HOSPITAL_BASED_OUTPATIENT_CLINIC_OR_DEPARTMENT_OTHER): Payer: Self-pay | Admitting: Pulmonary Disease

## 2022-12-09 ENCOUNTER — Encounter: Payer: Self-pay | Admitting: Registered Nurse

## 2022-12-09 ENCOUNTER — Encounter: Payer: Managed Care, Other (non HMO) | Attending: Physical Medicine and Rehabilitation | Admitting: Registered Nurse

## 2022-12-09 VITALS — BP 124/85 | HR 81 | Ht 61.0 in | Wt 212.0 lb

## 2022-12-09 DIAGNOSIS — M7541 Impingement syndrome of right shoulder: Secondary | ICD-10-CM | POA: Diagnosis not present

## 2022-12-09 DIAGNOSIS — G8929 Other chronic pain: Secondary | ICD-10-CM | POA: Insufficient documentation

## 2022-12-09 DIAGNOSIS — M542 Cervicalgia: Secondary | ICD-10-CM | POA: Diagnosis not present

## 2022-12-09 DIAGNOSIS — Z5181 Encounter for therapeutic drug level monitoring: Secondary | ICD-10-CM | POA: Insufficient documentation

## 2022-12-09 DIAGNOSIS — M545 Low back pain, unspecified: Secondary | ICD-10-CM | POA: Insufficient documentation

## 2022-12-09 DIAGNOSIS — Z79891 Long term (current) use of opiate analgesic: Secondary | ICD-10-CM | POA: Insufficient documentation

## 2022-12-09 DIAGNOSIS — G894 Chronic pain syndrome: Secondary | ICD-10-CM | POA: Insufficient documentation

## 2022-12-09 DIAGNOSIS — M797 Fibromyalgia: Secondary | ICD-10-CM | POA: Insufficient documentation

## 2022-12-09 DIAGNOSIS — M25561 Pain in right knee: Secondary | ICD-10-CM | POA: Insufficient documentation

## 2022-12-09 MED ORDER — TRAMADOL HCL 50 MG PO TABS: 50.0000 mg | ORAL_TABLET | Freq: Three times a day (TID) | ORAL | 5 refills | Status: DC | PRN

## 2022-12-09 NOTE — Progress Notes (Unsigned)
Subjective:    Patient ID: Kelsey Simmons, female    DOB: 11-Oct-1960, 62 y.o.   MRN: [id number]  HPI: Kelsey Simmons is a 62 y.o. female who returns for follow up appointment for chronic pain and medication refill. states *** pain is located in  ***. rates pain ***. current exercise regime is walking and performing stretching exercises.  Kelsey Simmons Morphine equivalent is 30.00  MME.  Last UDS was Performed on 05/22/2022, it was consistent.     Pain Inventory Average Pain 7 Pain Right Now 9 My pain is constant, burning, stabbing, and aching  In the last 24 hours, has pain interfered with the following? General activity 0 Relation with others 0 Enjoyment of life 0 What TIME of day is your pain at its worst? daytime and evening Sleep (in general) Good  Pain is worse with: sitting Pain improves with: rest and medication Relief from Meds: 8  Family History  Problem Relation Age of Onset  . Diabetes Mellitus II Mother   . Lupus Mother   . Arthritis Mother   . Diabetes Mother   . Hypertension Mother   . Miscarriages / [place] Mother   . Lung cancer Father        smoker/worked in factory  . Alcohol abuse Father   . Cancer Father        smoker, worked in [employer]  . Early death Father   . Hypertension Father   . Diabetes Mellitus II Sister   . Arthritis Sister   . Diabetes Sister   . Hypertension Sister   . Obesity Sister   . Diabetes Mellitus II Brother   . Hypercholesterolemia Brother   . Diabetes Brother   . Cancer Maternal Grandmother        Either uterine or endometrial  . Colon cancer Neg Hx   . Breast cancer Neg Hx    Social History   Socioeconomic History  . Marital status: Married    Spouse name: Lucious  . Number of children: 2  . Years of education: Not on file  . Highest education level: Some college, no degree  Occupational History  . Not on file  Tobacco Use  . Smoking status: Never  . Smokeless tobacco: Never  Vaping Use  . Vaping status:  Never Used  Substance and Sexual Activity  . Alcohol use: Not Currently    Comment: quit 1994  . Drug use: Not Currently    Types: "Crack" cocaine    Comment: quit 1994  . Sexual activity: Not Currently    Birth control/protection: Post-menopausal  Other Topics Concern  . Not on file  Social History Narrative   Lives with husband   Caffeine- 11 oz c daily   Social Determinants of Health   Financial Resource Strain: Low Risk  (07/05/2022)   Overall Financial Resource Strain (CARDIA)   . Difficulty of Paying Living Expenses: Not hard at all  Food Insecurity: No Food Insecurity (07/05/2022)   Hunger Vital Sign   . Worried About Programme researcher, broadcasting/film/video in the Last Year: Never true   . Ran Out of Food in the Last Year: Never true  Transportation Needs: No Transportation Needs (07/05/2022)   PRAPARE - Transportation   . Lack of Transportation (Medical): No   . Lack of Transportation (Non-Medical): No  Physical Activity: Unknown (07/05/2022)   Exercise Vital Sign   . Days of Exercise per Week: 0 days   . Minutes of Exercise per Session: Not  on file  Stress: Medium Risk (11/27/2022)   Received from [facility]   Stress   . Stress in your Life: 1   . Dealing with Stress: 2  Social Connections: Moderately Isolated (07/05/2022)   Social Connection and Isolation Panel [NHANES]   . Frequency of Communication with Friends and Family: More than three times a week   . Frequency of Social Gatherings with Friends and Family: Three times a week   . Attends Religious Services: Never   . Active Member of Clubs or Organizations: No   . Attends Banker Meetings: Not on file   . Marital Status: Married   Past Surgical History:  Procedure Laterality Date  . ABDOMINAL HYSTERECTOMY  1997  . BUNIONECTOMY  2012  . CESAREAN SECTION    . GASTRIC BYPASS  2006  . JOINT REPLACEMENT     RTKA 7'15  . ROBOTIC ASSISTED LAPAROSCOPIC LYSIS OF ADHESION N/A 04/19/2019   Procedure: XI ROBOTIC  ASSISTED LAPAROSCOPIC LYSIS OF ADHESION;  Surgeon: Carver Fila, MD;  Location: [facility];  Service: Gynecology;  Laterality: N/A;  . ROBOTIC ASSISTED SALPINGO OOPHERECTOMY Left 04/19/2019   Procedure: XI ROBOTIC ASSISTED SALPINGO OOPHORECTOMY;  Surgeon: Carver Fila, MD;  Location: [facility];  Service: Gynecology;  Laterality: Left;  . 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 TOTAL KNEE ARTHROPLASTY Left 04/13/2014   Procedure: LEFT TOTAL KNEE ARTHROPLASTY;  Surgeon: Ranee Gosselin, MD;  Location: [facility];  Service: Orthopedics;  Laterality: Left;  Marland Kitchen VENTRAL HERNIA REPAIR N/A 01/11/2019   Procedure: LAPAROSCOPIC VENTRAL HERNIA REPAIR WITH MESH;  Surgeon: Diamantina Monks, MD;  Location: [facility];  Service: General;  Laterality: N/A;   Past Surgical History:  Procedure Laterality Date  . ABDOMINAL HYSTERECTOMY  1997  . BUNIONECTOMY  2012  . CESAREAN SECTION    . GASTRIC BYPASS  2006  . JOINT REPLACEMENT     RTKA 7'15  . ROBOTIC ASSISTED LAPAROSCOPIC LYSIS OF ADHESION N/A 04/19/2019   Procedure: XI ROBOTIC ASSISTED LAPAROSCOPIC LYSIS OF ADHESION;  Surgeon: Carver Fila, MD;  Location: [facility];  Service: Gynecology;  Laterality: N/A;  . ROBOTIC ASSISTED SALPINGO OOPHERECTOMY Left 04/19/2019   Procedure: XI ROBOTIC ASSISTED SALPINGO OOPHORECTOMY;  Surgeon: Carver Fila, MD;  Location: [facility];  Service: Gynecology;  Laterality: Left;  . 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 TOTAL KNEE ARTHROPLASTY Left 04/13/2014   Procedure: LEFT TOTAL KNEE ARTHROPLASTY;  Surgeon: Ranee Gosselin, MD;  Location: [facility];  Service: Orthopedics;  Laterality: Left;  Marland Kitchen VENTRAL HERNIA REPAIR N/A 01/11/2019   Procedure:  LAPAROSCOPIC VENTRAL HERNIA REPAIR WITH MESH;  Surgeon: Diamantina Monks, MD;  Location: [facility];  Service: General;  Laterality: N/A;   Past Medical History:  Diagnosis Date  . Allergy   . Anxiety   . Arthritis   . Bilateral sacroiliitis (HCC)   . Chronic back pain   . Depression 2020   in therapy but not on medications  . Diabetes mellitus without complication (HCC)    type 2   . Fibromyalgia   . Headache(784.0)    HX MIGRAINES  . History of kidney stones 2020   left parenchymal stone identified on CT 11/23/18  kidney cyst  . Hypercholesterolemia   . Hypertension   . Lumbar spondylosis   . Lumbosacral neuritis   . Myofascial pain   . Paresthesia of lower extremity   . Sleep apnea    HAS NOT USED C PAP SINCE 2007 DUE TO WT LOSS   . Trochanteric bursitis    left hip   There were no vitals taken for this visit.  Opioid Risk Score:   Fall Risk Score:  `1  Depression screen [facility] 2/9     11/13/2022    2:34 PM 09/12/2022    3:19 PM 09/04/2022    4:36 PM 08/12/2022    3:50 PM 07/24/2022   10:02 AM 07/08/2022    2:37 PM 05/22/2022    2:35 PM  Depression screen PHQ 2/9  Decreased Interest 3 1 3  0 1 0 1  Down, Depressed, Hopeless 3 1 3  0 1 0 1  PHQ - 2 Score 6 2 6  0 2 0 2  Altered sleeping  1  0  0   Tired, decreased energy  1  0  0   Change in appetite  0  0  0   Feeling bad or failure about yourself   0    0   Trouble concentrating  1  0  0   Moving slowly or fidgety/restless  0  0  0   Suicidal thoughts  0  0  0   PHQ-9 Score  5  0  0   Difficult doing work/chores  Somewhat difficult  Not difficult at all  Not difficult at all       Review of Systems  Musculoskeletal:  Positive for neck pain.       R Knee Pain      Objective:   Physical Exam Constitutional:      Appearance: She is obese.         Assessment & Plan:

## 2022-12-11 ENCOUNTER — Encounter: Payer: Self-pay | Admitting: Registered Nurse

## 2022-12-11 MED ORDER — OPTICHAMBER DIAMOND-LG MASK DEVI
0 refills | Status: DC
  Filled 2022-12-11: qty 1, fill #0
  Filled 2022-12-12: qty 1, 1d supply, fill #0

## 2022-12-11 NOTE — Addendum Note (Signed)
Addended by: Luciano Cutter on: 12/11/2022 05:56 PM   Modules accepted: Orders

## 2022-12-12 ENCOUNTER — Encounter (HOSPITAL_BASED_OUTPATIENT_CLINIC_OR_DEPARTMENT_OTHER): Payer: Self-pay | Admitting: Family Medicine

## 2022-12-12 ENCOUNTER — Other Ambulatory Visit (HOSPITAL_BASED_OUTPATIENT_CLINIC_OR_DEPARTMENT_OTHER): Payer: Self-pay

## 2022-12-13 ENCOUNTER — Other Ambulatory Visit (HOSPITAL_BASED_OUTPATIENT_CLINIC_OR_DEPARTMENT_OTHER): Payer: Self-pay

## 2022-12-14 LAB — PULMONARY FUNCTION TEST
DL/VA % pred: 123 %
DL/VA: 5.31 ml/min/mmHg/L
DLCO cor % pred: 91 %
DLCO cor: 16.66 ml/min/mmHg
DLCO unc % pred: 91 %
DLCO unc: 16.66 ml/min/mmHg
FEF 25-75 Post: 4.52 L/s
FEF 25-75 Pre: 3.16 L/s
FEF2575-%Change-Post: 43 %
FEF2575-%Pred-Post: 207 %
FEF2575-%Pred-Pre: 144 %
FEV1-%Change-Post: 5 %
FEV1-%Pred-Post: 81 %
FEV1-%Pred-Pre: 76 %
FEV1-Post: 1.85 L
FEV1-Pre: 1.75 L
FEV1FVC-%Change-Post: 2 %
FEV1FVC-%Pred-Pre: 118 %
FEV6-%Change-Post: 2 %
FEV6-%Pred-Post: 68 %
FEV6-%Pred-Pre: 66 %
FEV6-Post: 1.95 L
FEV6-Pre: 1.9 L
FEV6FVC-%Pred-Post: 103 %
FEV6FVC-%Pred-Pre: 103 %
FVC-%Change-Post: 2 %
FVC-%Pred-Post: 66 %
FVC-%Pred-Pre: 64 %
FVC-Post: 1.95 L
FVC-Pre: 1.9 L
Post FEV1/FVC ratio: 95 %
Post FEV6/FVC ratio: 100 %
Pre FEV1/FVC ratio: 92 %
Pre FEV6/FVC Ratio: 100 %
RV % pred: 66 %
RV: 1.21 L
TLC % pred: 78 %
TLC: 3.61 L

## 2022-12-23 ENCOUNTER — Other Ambulatory Visit (HOSPITAL_BASED_OUTPATIENT_CLINIC_OR_DEPARTMENT_OTHER): Payer: Self-pay

## 2022-12-24 ENCOUNTER — Encounter (HOSPITAL_COMMUNITY): Payer: Self-pay

## 2022-12-25 ENCOUNTER — Other Ambulatory Visit (HOSPITAL_COMMUNITY): Payer: Self-pay

## 2022-12-25 DIAGNOSIS — F330 Major depressive disorder, recurrent, mild: Secondary | ICD-10-CM

## 2022-12-25 DIAGNOSIS — F411 Generalized anxiety disorder: Secondary | ICD-10-CM

## 2022-12-25 MED ORDER — BUPROPION HCL ER (XL) 300 MG PO TB24: 300.0000 mg | ORAL_TABLET | ORAL | 2 refills | Status: DC

## 2023-01-06 ENCOUNTER — Ambulatory Visit (HOSPITAL_COMMUNITY)
Admission: RE | Admit: 2023-01-06 | Discharge: 2023-01-06 | Disposition: A | Discharge status: Home / Self Care | Payer: Managed Care, Other (non HMO) | Source: Ambulatory Visit | Attending: Internal Medicine | Admitting: Internal Medicine

## 2023-01-06 ENCOUNTER — Ambulatory Visit (HOSPITAL_COMMUNITY): Payer: Managed Care, Other (non HMO)

## 2023-01-06 ENCOUNTER — Encounter (HOSPITAL_COMMUNITY): Payer: Self-pay

## 2023-01-06 ENCOUNTER — Ambulatory Visit (INDEPENDENT_AMBULATORY_CARE_PROVIDER_SITE_OTHER): Payer: Managed Care, Other (non HMO)

## 2023-01-06 ENCOUNTER — Ambulatory Visit (HOSPITAL_COMMUNITY): Payer: Self-pay

## 2023-01-06 VITALS — BP 99/67 | HR 79 | Temp 98.4°F | Resp 16

## 2023-01-06 DIAGNOSIS — S83411A Sprain of medial collateral ligament of right knee, initial encounter: Secondary | ICD-10-CM | POA: Diagnosis not present

## 2023-01-06 MED ORDER — IBUPROFEN 600 MG PO TABS: 600.0000 mg | ORAL_TABLET | Freq: Four times a day (QID) | ORAL | 0 refills | Status: DC | PRN

## 2023-01-06 NOTE — Discharge Instructions (Addendum)
Rest and elevate the affected painful area.  Apply cold compresses intermittently as needed.   As pain recedes, begin normal activities slowly as tolerated. You may take ibuprofen as needed for pain Please feel free to return to urgent care if you have worsening symptoms.

## 2023-01-06 NOTE — ED Provider Notes (Signed)
[facility]    CSN: [id number] Arrival date & time: 01/06/23  1738      History   Chief Complaint Chief Complaint  Patient presents with   Appointment    Slid down bottom stairs with left leg going down to the bottom while the right leg was stuck behind with the right knee bending forward. Tender to the touch with excruciating pain or slight movement causing excruciating pain.     HPI Kelsey Simmons is a 62 y.o. female comes to the urgent care with right knee pain which started after she slid down a flight of stairs.  The incident happened 3 days ago.  Patient describes the pain as sharp and throbbing and currently of moderate severity.  Pain is aggravated by placing her leg in a particular position.  No known relieving factors.  Patient has had bilateral knee replacement.  No radiation of pain.Marland Kitchen   HPI  Past Medical History:  Diagnosis Date   Allergy    Anxiety    Arthritis    Bilateral sacroiliitis (HCC)    Chronic back pain    Depression 2020   in therapy but not on medications   Diabetes mellitus without complication (HCC)    type 2    Fibromyalgia    Headache(784.0)    HX MIGRAINES   History of kidney stones 2020   left parenchymal stone identified on CT 11/23/18   kidney cyst   Hypercholesterolemia    Hypertension    Lumbar spondylosis    Lumbosacral neuritis    Myofascial pain    Paresthesia of lower extremity    Sleep apnea    HAS NOT USED C PAP SINCE 2007 DUE TO WT LOSS    Trochanteric bursitis    left hip    Patient Active Problem List   Diagnosis Date Noted   Arthritis of right shoulder region 11/18/2022   Impingement syndrome of right shoulder 11/18/2022   Chronic right shoulder pain 09/04/2022   Myofascial pain 09/04/2022   Cough 08/12/2022   Bilateral knee pain 07/08/2022   Bilateral foot pain 04/04/2022   Bilateral ankle pain 04/04/2022   Encounter for therapeutic drug monitoring 02/21/2022   Encounter for long-term opiate  analgesic use 02/21/2022   Chronic bilateral low back pain with sciatica 12/10/2021   Lumbar facet arthropathy 12/10/2021   Chronic pain syndrome 12/10/2021   Neuropathic pain 12/10/2021   Depression, major, single episode, mild (HCC) 11/15/2021   Rash 04/13/2021   Bone spur of foot 07/31/2020   Cervical spondylitis (HCC) 03/02/2020   BMI 40.0-44.9, adult (HCC) 07/06/2019   Ovarian cyst, left 04/09/2019   Exposure to severe acute respiratory syndrome coronavirus 2 (SARS-CoV-2) 02/13/2019   Abdominal pain 02/09/2019   Acquired spondylolisthesis 02/09/2019   Allergic rhinitis 02/09/2019   Constipation 02/09/2019   Fibrocystic breast changes 02/09/2019   Fibromyalgia 02/09/2019   Gastroesophageal reflux disease without esophagitis 02/09/2019   Iron deficiency anemia 02/09/2019   Migraine with aura 02/09/2019   Pure hypercholesterolemia 02/09/2019   Bilateral sacroiliitis (HCC) 01/26/2019   Diabetes mellitus without complication (HCC) 09/22/2018   Hypertension 04/12/2015   History of total knee arthroplasty 04/13/2014   Prolapsed lumbar disc 10/14/2013   Osteoarthritis of right knee 08/18/2013   Hx of total knee arthroplasty 08/18/2013   Inflammation of sacroiliac joint (HCC) 11/09/2012    Past Surgical History:  Procedure Laterality Date   ABDOMINAL HYSTERECTOMY  1997   BUNIONECTOMY  2012   CESAREAN SECTION  GASTRIC BYPASS  2006   JOINT REPLACEMENT     RTKA 7'15   ROBOTIC ASSISTED LAPAROSCOPIC LYSIS OF ADHESION N/A 04/19/2019   Procedure: XI ROBOTIC ASSISTED LAPAROSCOPIC LYSIS OF ADHESION;  Surgeon: Carver Fila, MD;  Location: [facility];  Service: Gynecology;  Laterality: N/A;   ROBOTIC ASSISTED SALPINGO OOPHERECTOMY Left 04/19/2019   Procedure: XI ROBOTIC ASSISTED SALPINGO OOPHORECTOMY;  Surgeon: Carver Fila, MD;  Location: [facility];  Service: Gynecology;  Laterality: Left;   TOTAL KNEE ARTHROPLASTY Right 08/18/2013   Procedure: RIGHT TOTAL KNEE ARTHROPLASTY;   Surgeon: Jacki Cones, MD;  Location: [facility];  Service: Orthopedics;  Laterality: Right;   TOTAL KNEE ARTHROPLASTY Left 04/13/2014   Procedure: LEFT TOTAL KNEE ARTHROPLASTY;  Surgeon: Ranee Gosselin, MD;  Location: [facility];  Service: Orthopedics;  Laterality: Left;   VENTRAL HERNIA REPAIR N/A 01/11/2019   Procedure: LAPAROSCOPIC VENTRAL HERNIA REPAIR WITH MESH;  Surgeon: Diamantina Monks, MD;  Location: [facility];  Service: General;  Laterality: N/A;    OB History   No obstetric history on file.      Home Medications    Prior to Admission medications   Medication Sig Start Date End Date Taking? Authorizing Provider  ibuprofen (ADVIL) 600 MG tablet Take 1 tablet (600 mg total) by mouth every 6 (six) hours as needed. 01/06/23  Yes Quantia Grullon, Britta Mccreedy, MD  albuterol (VENTOLIN HFA) 108 (90 Base) MCG/ACT inhaler Inhale 2 puffs into the lungs every 6 (six) hours as needed for wheezing or shortness of breath. 10/30/22   Luciano Cutter, MD  amLODipine (NORVASC) 5 MG tablet TAKE 1 TABLET DAILY 05/14/22   Novella Olive, FNP  atorvastatin (LIPITOR) 20 MG tablet TAKE 1 TABLET DAILY 05/14/22   Novella Olive, FNP  azelastine (ASTELIN) 0.1 % nasal spray Place 2 sprays into both nostrils 2 (two) times daily. Use in each nostril as directed 07/09/22   [place], Buren Kos, MD  Blood Glucose Monitoring Suppl DEVI 1 each by Does not apply route in the morning, at noon, and at bedtime. One Touch Verio Flex per patients insurance 09/25/22   [place], Buren Kos, MD  buPROPion (WELLBUTRIN XL) 300 MG 24 hr tablet Take 1 tablet (300 mg total) by mouth every morning. Patient is increasing from the 150 to 300 mg 12/25/22 03/25/23  Arfeen, Phillips Grout, MD  clotrimazole (LOTRIMIN) 1 % cream Apply 1 Application topically 2 (two) times daily. 08/12/22   [place], Raymond J, MD  gabapentin (NEURONTIN) 600 MG tablet TAKE 1 TABLET THREE TIMES A DAY 05/14/22   Novella Olive, FNP  Glucose Blood (BLOOD GLUCOSE TEST STRIPS) STRP 1 each by In  Vitro route in the morning, at noon, and at bedtime. May substitute to any manufacturer covered by patient's insurance. 09/12/22   [place], Raymond J, MD  hydrochlorothiazide (HYDRODIURIL) 25 MG tablet TAKE 1 TABLET EVERY MORNING 05/14/22   Novella Olive, FNP  ipratropium (ATROVENT) 0.03 % nasal spray Place 2 sprays into both nostrils 2 (two) times daily. 10/30/22   Luciano Cutter, MD  lamoTRIgine (LAMICTAL) 100 MG tablet Take 1 tablet (100 mg total) by mouth daily. Bridge to pt appt 10/30 11/20/22   Arfeen, Phillips Grout, MD  methocarbamol (ROBAXIN-750) 750 MG tablet Take 1 tablet (750 mg total) by mouth every 8 (eight) hours as needed for muscle spasms. 09/04/22   Elijah Birk C, DO  MOUNJARO 5 MG/0.5ML Pen INJECT 5 MG UNDER THE SKIN  WEEKLY 08/19/22   [place], Buren Kos, MD  Spacer/Aero-Holding Chambers (OPTICHAMBER DIAMOND-LG MASK) [facility] Use with inhaler 12/11/22   Luciano Cutter, MD  SUMAtriptan (IMITREX) 100 MG tablet Take 1 tablet (100 mg total) by mouth every 2 (two) hours as needed for migraine or headache. May repeat in 2 hours if headache persists or recurs. 04/12/21   Janeece Agee, NP  traMADol (ULTRAM) 50 MG tablet Take 1 tablet (50 mg total) by mouth every 8 (eight) hours as needed. 12/09/22   Jones Bales, NP    Family History Family History  Problem Relation Age of Onset   Diabetes Mellitus II Mother    Lupus Mother    Arthritis Mother    Diabetes Mother    Hypertension Mother    Miscarriages / [place] Mother    Lung cancer Father        smoker/worked in factory   Alcohol abuse Father    Cancer Father        smoker, worked in Marine scientist   Early death Father    Hypertension Father    Diabetes Mellitus II Sister    Arthritis Sister    Diabetes Sister    Hypertension Sister    Obesity Sister    Diabetes Mellitus II Brother    Hypercholesterolemia Brother    Diabetes Brother    Cancer Maternal Grandmother        Either uterine or endometrial   Colon cancer Neg Hx     Breast cancer Neg Hx     Social History Social History   Tobacco Use   Smoking status: Never   Smokeless tobacco: Never  Vaping Use   Vaping status: Never Used  Substance Use Topics   Alcohol use: Not Currently    Comment: quit 1994   Drug use: Not Currently    Types: "Crack" cocaine    Comment: quit 1994     Allergies   Topiramate, Chlorhexidine gluconate, and Tape   Review of Systems Review of Systems As per HPI  Physical Exam Triage Vital Signs ED Triage Vitals  Encounter Vitals Group     BP 01/06/23 1800 (!) 89/63     Systolic BP Percentile --      Diastolic BP Percentile --      Pulse Rate 01/06/23 1800 81     Resp 01/06/23 1800 16     Temp 01/06/23 1800 98.4 F (36.9 C)     Temp Source 01/06/23 1800 Oral     SpO2 01/06/23 1800 98 %     Weight --      Height --      Head Circumference --      Peak Flow --      Pain Score 01/06/23 1757 10     Pain Loc --      Pain Education --      Exclude from Growth Chart --    No data found.  Updated Vital Signs BP 99/67 (BP Location: Right Arm)   Pulse 79   Temp 98.4 F (36.9 C) (Oral)   Resp 16   SpO2 98%   Visual Acuity Right Eye Distance:   Left Eye Distance:   Bilateral Distance:    Right Eye Near:   Left Eye Near:    Bilateral Near:     Physical Exam Vitals and nursing note reviewed.  Constitutional:      General: She is in acute distress.     Appearance: She is not ill-appearing.  Cardiovascular:     Rate and Rhythm: Normal rate and regular rhythm.     Pulses: Normal pulses.     Heart sounds: Normal heart sounds.  Pulmonary:     Effort: Pulmonary effort is normal.     Breath sounds: Normal breath sounds.  Abdominal:     Tenderness: There is no guarding.  Musculoskeletal:        General: Normal range of motion.     Comments: Full range of motion of the right knee with pain.  No bruising or deformity.  There is tenderness in the superomedial aspect of the right knee.  No fluctuance or  hematoma seen.  Neurological:     Mental Status: She is alert.      UC Treatments / Results  Labs (all labs ordered are listed, but only abnormal results are displayed) Labs Reviewed - No data to display  EKG   Radiology No results found.  Procedures Procedures (including critical care time)  Medications Ordered in UC Medications - No data to display  Initial Impression / Assessment and Plan / UC Course  I have reviewed the triage vital signs and the nursing notes.  Pertinent labs & imaging results that were available during my care of the patient were reviewed by me and considered in my medical decision making (see chart for details).     1.  Sprain of the medial collateral ligament of the right knee: X-ray of the right knee is negative for fracture-no follow-up call as needed Ibuprofen 600 mg every 6 hours as needed for pain Icing of the right knee Gentle range of motion exercises Return precautions given. Final Clinical Impressions(s) / UC Diagnoses   Final diagnoses:  Sprain of medial collateral ligament of right knee, initial encounter     Discharge Instructions      Rest and elevate the affected painful area.  Apply cold compresses intermittently as needed.   As pain recedes, begin normal activities slowly as tolerated. You may take ibuprofen as needed for pain Please feel free to return to urgent care if you have worsening symptoms.    ED Prescriptions     Medication Sig Dispense Auth. Provider   ibuprofen (ADVIL) 600 MG tablet Take 1 tablet (600 mg total) by mouth every 6 (six) hours as needed. 30 tablet Arby Dahir, Britta Mccreedy, MD      PDMP not reviewed this encounter.   Merrilee Jansky, MD 01/06/23 [phone]

## 2023-01-06 NOTE — ED Triage Notes (Addendum)
Pt present with right knee pain States she "slid down bottom stairs with left leg going down to the bottom while the right leg was stuck behind". Fall happened saturday

## 2023-01-07 ENCOUNTER — Other Ambulatory Visit (HOSPITAL_BASED_OUTPATIENT_CLINIC_OR_DEPARTMENT_OTHER): Payer: Self-pay | Admitting: Family Medicine

## 2023-01-08 ENCOUNTER — Ambulatory Visit (HOSPITAL_COMMUNITY): Payer: Managed Care, Other (non HMO)

## 2023-01-09 ENCOUNTER — Ambulatory Visit (HOSPITAL_BASED_OUTPATIENT_CLINIC_OR_DEPARTMENT_OTHER): Payer: Managed Care, Other (non HMO) | Admitting: Pulmonary Disease

## 2023-01-16 ENCOUNTER — Ambulatory Visit (HOSPITAL_BASED_OUTPATIENT_CLINIC_OR_DEPARTMENT_OTHER): Payer: Managed Care, Other (non HMO) | Admitting: Family Medicine

## 2023-01-23 ENCOUNTER — Encounter (HOSPITAL_BASED_OUTPATIENT_CLINIC_OR_DEPARTMENT_OTHER): Payer: Self-pay

## 2023-01-23 ENCOUNTER — Ambulatory Visit (HOSPITAL_BASED_OUTPATIENT_CLINIC_OR_DEPARTMENT_OTHER): Payer: Managed Care, Other (non HMO) | Admitting: Family Medicine

## 2023-01-29 ENCOUNTER — Encounter (HOSPITAL_BASED_OUTPATIENT_CLINIC_OR_DEPARTMENT_OTHER): Payer: Self-pay | Admitting: *Deleted

## 2023-01-29 ENCOUNTER — Telehealth (HOSPITAL_BASED_OUTPATIENT_CLINIC_OR_DEPARTMENT_OTHER): Payer: Self-pay | Admitting: Family Medicine

## 2023-01-29 NOTE — Telephone Encounter (Signed)
 Copied from CRM [id number]. Topic: Clinical - Medication Question >> Jan 29, 2023  3:12 PM Zayanah H wrote: Reason for CRM: Patient is experiencing low blood pressure today 98/66 taking amLODipine  (NORVASC ) 5 MG tablet & hydrochlorothiazide  (HYDRODIURIL ) 25 MG tablet  Copied from CRM [id number]. Topic: Clinical - Medication Question >> Jan 29, 2023  3:12 PM Zayanah H wrote: Reason for CRM: Patient is experiencing low blood pressure today 98/66 taking amLODipine  (NORVASC ) 5 MG tablet & hydrochlorothiazide  (HYDRODIURIL ) 25 MG tablet

## 2023-01-30 NOTE — Telephone Encounter (Signed)
 See phone message called pt

## 2023-01-30 NOTE — Telephone Encounter (Signed)
 Called pt see phone message from [facility]

## 2023-01-30 NOTE — Telephone Encounter (Signed)
 Called pt advised with verbal understanding moved 2/5 appt up to 1/27. Pt will come off of hydrochlorothiazide and continue amlodipine. She will monitor bp and keep up with readings. She will reach out if the bp fluctuates too low or too high

## 2023-02-06 ENCOUNTER — Ambulatory Visit: Payer: Managed Care, Other (non HMO) | Admitting: Gastroenterology

## 2023-02-06 NOTE — Progress Notes (Deleted)
[facility] Consult Note:  History: Kelsey Simmons 02/06/2023  Referring provider: de [place], Buren Kos, MD  Reason for consult/chief complaint: No chief complaint on file.   Subjective  Prior history:  Positive Cologuard ordered by primary care September 2024.   Discussed the use of AI scribe software for clinical note transcription with the patient, who gave verbal consent to proceed.  History of Present Illness            ***   ROS:  Review of Systems   Past Medical History: Past Medical History:  Diagnosis Date   Allergy    Anxiety    Arthritis    Bilateral sacroiliitis (HCC)    Chronic back pain    Depression 2020   in therapy but not on medications   Diabetes mellitus without complication (HCC)    type 2    Fibromyalgia    Headache(784.0)    HX MIGRAINES   History of kidney stones 2020   left parenchymal stone identified on CT 11/23/18   kidney cyst   Hypercholesterolemia    Hypertension    Lumbar spondylosis    Lumbosacral neuritis    Myofascial pain    Paresthesia of lower extremity    Sleep apnea    HAS NOT USED C PAP SINCE 2007 DUE TO WT LOSS    Trochanteric bursitis    left hip     Past Surgical History: Past Surgical History:  Procedure Laterality Date   ABDOMINAL HYSTERECTOMY  1997   BUNIONECTOMY  2012   CESAREAN SECTION     GASTRIC BYPASS  2006   JOINT REPLACEMENT     RTKA 7'15   ROBOTIC ASSISTED LAPAROSCOPIC LYSIS OF ADHESION N/A 04/19/2019   Procedure: XI ROBOTIC ASSISTED LAPAROSCOPIC LYSIS OF ADHESION;  Surgeon: Carver Fila, MD;  Location: [facility];  Service: Gynecology;  Laterality: N/A;   ROBOTIC ASSISTED SALPINGO OOPHERECTOMY Left 04/19/2019   Procedure: XI ROBOTIC ASSISTED SALPINGO OOPHORECTOMY;  Surgeon: Carver Fila, MD;  Location: [facility];  Service: Gynecology;  Laterality: Left;   TOTAL KNEE ARTHROPLASTY Right 08/18/2013   Procedure: RIGHT TOTAL KNEE ARTHROPLASTY;  Surgeon: Jacki Cones, MD;  Location: [facility];  Service: Orthopedics;  Laterality: Right;   TOTAL KNEE ARTHROPLASTY Left 04/13/2014   Procedure: LEFT TOTAL KNEE ARTHROPLASTY;  Surgeon: Ranee Gosselin, MD;  Location: [facility];  Service: Orthopedics;  Laterality: Left;   VENTRAL HERNIA REPAIR N/A 01/11/2019   Procedure: LAPAROSCOPIC VENTRAL HERNIA REPAIR WITH MESH;  Surgeon: Diamantina Monks, MD;  Location: [facility];  Service: General;  Laterality: N/A;     Family History: Family History  Problem Relation Age of Onset   Diabetes Mellitus II Mother    Lupus Mother    Arthritis Mother    Diabetes Mother    Hypertension Mother    Miscarriages / [place] Mother    Lung cancer Father        smoker/worked in factory   Alcohol abuse Father    Cancer Father        smoker, worked in Marine scientist   Early death Father    Hypertension Father    Diabetes Mellitus II Sister    Arthritis Sister    Diabetes Sister    Hypertension Sister    Obesity Sister    Diabetes Mellitus II Brother    Hypercholesterolemia Brother    Diabetes Brother    Cancer Maternal Grandmother        Either  uterine or endometrial   Colon cancer Neg Hx    Breast cancer Neg Hx     Social History: Social History   Socioeconomic History   Marital status: Married    Spouse name: Lucious   Number of children: 2   Years of education: Not on file   Highest education level: Some college, no degree  Occupational History   Not on file  Tobacco Use   Smoking status: Never   Smokeless tobacco: Never  Vaping Use   Vaping status: Never Used  Substance and Sexual Activity   Alcohol use: Not Currently    Comment: quit 1994   Drug use: Not Currently    Types: "Crack" cocaine    Comment: quit 1994   Sexual activity: Not Currently    Birth control/protection: Post-menopausal  Other Topics Concern   Not on file  Social History Narrative   Lives with husband   Caffeine- 11 oz c daily   Social Drivers of Health   Financial Resource  Strain: Low Risk  (01/21/2023)   Overall Financial Resource Strain (CARDIA)    Difficulty of Paying Living Expenses: Not hard at all  Food Insecurity: No Food Insecurity (01/21/2023)   Hunger Vital Sign    Worried About Running Out of Food in the Last Year: Never true    Ran Out of Food in the Last Year: Never true  Transportation Needs: No Transportation Needs (01/21/2023)   PRAPARE - Administrator, Civil Service (Medical): No    Lack of Transportation (Non-Medical): No  Physical Activity: Unknown (01/21/2023)   Exercise Vital Sign    Days of Exercise per Week: 0 days    Minutes of Exercise per Session: Not on file  Recent Concern: Physical Activity - Medium Risk (01/11/2023)   Received from [facility]   Physical Activity    Moderate Physical Activity: Not on file    Vigorous Physical Activity: Never    Time Spent Sitting: More than 8 hours  Stress: Stress Concern Present (01/21/2023)   [employer] of Occupational Health - Occupational Stress Questionnaire    Feeling of Stress : To some extent  Social Connections: Moderately Isolated (01/21/2023)   Social Connection and Isolation Panel [NHANES]    Frequency of Communication with Friends and Family: More than three times a week    Frequency of Social Gatherings with Friends and Family: More than three times a week    Attends Religious Services: Never    Database administrator or Organizations: No    Attends Engineer, structural: Not on file    Marital Status: Married    Allergies: Allergies  Allergen Reactions   Topiramate     Other reaction(s): double vision   Chlorhexidine Gluconate Rash   Tape Rash    DERMABOND    Outpatient Meds: Current Outpatient Medications  Medication Sig Dispense Refill   albuterol (VENTOLIN HFA) 108 (90 Base) MCG/ACT inhaler Inhale 2 puffs into the lungs every 6 (six) hours as needed for wheezing or shortness of breath. 80.4 g 1   amLODipine (NORVASC) 5 MG  tablet TAKE 1 TABLET DAILY 90 tablet 3   atorvastatin (LIPITOR) 20 MG tablet TAKE 1 TABLET DAILY 90 tablet 3   azelastine (ASTELIN) 0.1 % nasal spray Place 2 sprays into both nostrils 2 (two) times daily. Use in each nostril as directed 30 mL 2   Blood Glucose Monitoring Suppl DEVI 1 each by Does not apply route in the morning,  at noon, and at bedtime. One Touch Verio Flex per patients insurance 1 each 0   buPROPion (WELLBUTRIN XL) 300 MG 24 hr tablet Take 1 tablet (300 mg total) by mouth every morning. Patient is increasing from the 150 to 300 mg 30 tablet 2   clotrimazole (LOTRIMIN) 1 % cream Apply 1 Application topically 2 (two) times daily. 30 g 1   gabapentin (NEURONTIN) 600 MG tablet TAKE 1 TABLET THREE TIMES A DAY 270 tablet 3   Glucose Blood (BLOOD GLUCOSE TEST STRIPS) STRP 1 each by In Vitro route in the morning, at noon, and at bedtime. May substitute to any manufacturer covered by patient's insurance. 100 each 2   hydrochlorothiazide (HYDRODIURIL) 25 MG tablet TAKE 1 TABLET EVERY MORNING 90 tablet 3   ibuprofen (ADVIL) 600 MG tablet Take 1 tablet (600 mg total) by mouth every 6 (six) hours as needed. 30 tablet 0   ipratropium (ATROVENT) 0.03 % nasal spray Place 2 sprays into both nostrils 2 (two) times daily. 60 mL 1   lamoTRIgine (LAMICTAL) 100 MG tablet Take 1 tablet (100 mg total) by mouth daily. Bridge to pt appt 10/30 30 tablet 2   Lancets (ONETOUCH DELICA PLUS LANCET33G) MISC USE 1 LANCET IN THE MORNING, AT NOON AND AT BEDTIME 100 each 10   methocarbamol (ROBAXIN-750) 750 MG tablet Take 1 tablet (750 mg total) by mouth every 8 (eight) hours as needed for muscle spasms. 45 tablet 0   MOUNJARO 5 MG/0.5ML Pen INJECT 5 MG UNDER THE SKIN WEEKLY 6 mL 3   Spacer/Aero-Holding Chambers (OPTICHAMBER DIAMOND-LG MASK) DEVI Use with inhaler 1 each 0   SUMAtriptan (IMITREX) 100 MG tablet Take 1 tablet (100 mg total) by mouth every 2 (two) hours as needed for migraine or headache. May repeat in 2  hours if headache persists or recurs. 10 tablet 5   traMADol (ULTRAM) 50 MG tablet Take 1 tablet (50 mg total) by mouth every 8 (eight) hours as needed. 90 tablet 5   No current facility-administered medications for this visit.      ___________________________________________________________________ Objective   Exam:  There were no vitals taken for this visit. Wt Readings from Last 3 Encounters:  12/09/22 212 lb (96.2 kg)  11/28/22 214 lb (97.1 kg)  11/13/22 216 lb 9.6 oz (98.2 kg)    General: ***  Eyes: sclera anicteric, no redness ENT: oral mucosa moist without lesions, no cervical or supraclavicular lymphadenopathy CV: ***, no JVD, no peripheral edema Resp: clear to auscultation bilaterally, normal RR and effort noted GI: soft, *** tenderness, with active bowel sounds. No guarding or palpable organomegaly noted. Skin; warm and dry, no rash or jaundice noted Neuro: awake, alert and oriented x 3. Normal gross motor function and fluent speech   Labs:  The positive Cologuard report was discovered in a patient portal message October 2024 when the patient scanned a copy of the report and a message to primary care.      Latest Ref Rng & Units 04/12/2021   10:42 AM 04/14/2019    1:36 PM 01/06/2019   11:50 AM  CBC  WBC 4.0 - 10.5 K/uL 6.2  11.6  8.7   Hemoglobin 12.0 - 15.0 g/dL 16.1  09.6  04.5   Hematocrit 36.0 - 46.0 % 44.1  41.8  43.3   Platelets 150.0 - 400.0 K/uL 244.0  258  277       Latest Ref Rng & Units 12/10/2021    2:46 PM 04/12/2021  10:42 AM 04/14/2019    1:36 PM  CMP  Glucose 70 - 99 mg/dL 147  82  95   BUN 6 - 24 mg/dL 16  16  19    Creatinine 0.57 - 1.00 mg/dL 8.29  5.62  1.30   Sodium 134 - 144 mmol/L 142  141  140   Potassium 3.5 - 5.2 mmol/L 4.4  3.9 hemolyzed sample  3.4   Chloride 96 - 106 mmol/L 99  98  100   CO2 20 - 29 mmol/L 26  33  32   Calcium 8.7 - 10.2 mg/dL 86.5  9.5  9.5   Total Protein 6.0 - 8.5 g/dL 7.8  7.7  7.9   Total Bilirubin  0.0 - 1.2 mg/dL <7.8  0.4  0.3   Alkaline Phos 44 - 121 IU/L 95  66  55   AST 0 - 40 IU/L 27  23  16    ALT 0 - 32 IU/L 27  14  15       No diagnosis found.  Assessment and Plan              Plan:  ***  Thank you for the courtesy of this consult.  Please call me with any questions or concerns.  Charlie Pitter III  CC: Referring provider noted above

## 2023-02-07 NOTE — Progress Notes (Unsigned)
Chief Complaint:Positive fecal immunochemical test  Primary GI Doctor:***  HPI:  Patient is a  63  year old female patient with past medical history of Anxiety, Arthritis, Bilateral sacroiliitis (HCC), Chronic back pain, Depression (2020), Diabetes mellitus without complication (HCC), Fibromyalgia, Headache(784.0), History of kidney stones (2020), Hypercholesterolemia, Hypertension, Lumbar spondylosis, Lumbosacral neuritis, Myofascial pain, Paresthesia of lower extremity, Sleep apnea, and Trochanteric bursitis, who was referred to me by de [place], Buren Kos, MD on 10/28/2022 for a complaint of positive fecal immunochemical test .    Interval History  Patient admits/denies GERD Patient admits/denies dysphagia Patient admits/denies nausea, vomiting, or weight loss  Patient admits/denies altered bowel habits Patient admits/denies abdominal pain Patient admits/denies rectal bleeding   Denies/Admits alcohol Denies/Admits smoking Denies/Admits NSAID use. Denies/Admits they are on blood thinners.  Patients last colonoscopy*** Patients last EGD  Patient's family history includes  Wt Readings from Last 3 Encounters:  12/09/22 212 lb (96.2 kg)  11/28/22 214 lb (97.1 kg)  11/13/22 216 lb 9.6 oz (98.2 kg)      Past Medical History:  Diagnosis Date   Allergy    Anxiety    Arthritis    Bilateral sacroiliitis (HCC)    Chronic back pain    Depression 2020   in therapy but not on medications   Diabetes mellitus without complication (HCC)    type 2    Fibromyalgia    Headache(784.0)    HX MIGRAINES   History of kidney stones 2020   left parenchymal stone identified on CT 11/23/18   kidney cyst   Hypercholesterolemia    Hypertension    Lumbar spondylosis    Lumbosacral neuritis    Myofascial pain    Paresthesia of lower extremity    Sleep apnea    HAS NOT USED C PAP SINCE 2007 DUE TO WT LOSS    Trochanteric bursitis    left hip    Past Surgical History:  Procedure  Laterality Date   ABDOMINAL HYSTERECTOMY  1997   BUNIONECTOMY  2012   CESAREAN SECTION     GASTRIC BYPASS  2006   JOINT REPLACEMENT     RTKA 7'15   ROBOTIC ASSISTED LAPAROSCOPIC LYSIS OF ADHESION N/A 04/19/2019   Procedure: XI ROBOTIC ASSISTED LAPAROSCOPIC LYSIS OF ADHESION;  Surgeon: Carver Fila, MD;  Location: [facility];  Service: Gynecology;  Laterality: N/A;   ROBOTIC ASSISTED SALPINGO OOPHERECTOMY Left 04/19/2019   Procedure: XI ROBOTIC ASSISTED SALPINGO OOPHORECTOMY;  Surgeon: Carver Fila, MD;  Location: [facility];  Service: Gynecology;  Laterality: Left;   TOTAL KNEE ARTHROPLASTY Right 08/18/2013   Procedure: RIGHT TOTAL KNEE ARTHROPLASTY;  Surgeon: Jacki Cones, MD;  Location: [facility];  Service: Orthopedics;  Laterality: Right;   TOTAL KNEE ARTHROPLASTY Left 04/13/2014   Procedure: LEFT TOTAL KNEE ARTHROPLASTY;  Surgeon: Ranee Gosselin, MD;  Location: [facility];  Service: Orthopedics;  Laterality: Left;   VENTRAL HERNIA REPAIR N/A 01/11/2019   Procedure: LAPAROSCOPIC VENTRAL HERNIA REPAIR WITH MESH;  Surgeon: Diamantina Monks, MD;  Location: [facility];  Service: General;  Laterality: N/A;    Current Outpatient Medications  Medication Sig Dispense Refill   albuterol (VENTOLIN HFA) 108 (90 Base) MCG/ACT inhaler Inhale 2 puffs into the lungs every 6 (six) hours as needed for wheezing or shortness of breath. 80.4 g 1   amLODipine (NORVASC) 5 MG tablet TAKE 1 TABLET DAILY 90 tablet 3   atorvastatin (LIPITOR) 20 MG tablet TAKE 1 TABLET DAILY 90 tablet 3  azelastine (ASTELIN) 0.1 % nasal spray Place 2 sprays into both nostrils 2 (two) times daily. Use in each nostril as directed 30 mL 2   Blood Glucose Monitoring Suppl DEVI 1 each by Does not apply route in the morning, at noon, and at bedtime. One Touch Verio Flex per patients insurance 1 each 0   buPROPion (WELLBUTRIN XL) 300 MG 24 hr tablet Take 1 tablet (300 mg total) by mouth every morning. Patient is increasing from the 150 to 300  mg 30 tablet 2   clotrimazole (LOTRIMIN) 1 % cream Apply 1 Application topically 2 (two) times daily. 30 g 1   gabapentin (NEURONTIN) 600 MG tablet TAKE 1 TABLET THREE TIMES A DAY 270 tablet 3   Glucose Blood (BLOOD GLUCOSE TEST STRIPS) STRP 1 each by In Vitro route in the morning, at noon, and at bedtime. Jaree Dwight substitute to any manufacturer covered by patient's insurance. 100 each 2   hydrochlorothiazide (HYDRODIURIL) 25 MG tablet TAKE 1 TABLET EVERY MORNING 90 tablet 3   ibuprofen (ADVIL) 600 MG tablet Take 1 tablet (600 mg total) by mouth every 6 (six) hours as needed. 30 tablet 0   ipratropium (ATROVENT) 0.03 % nasal spray Place 2 sprays into both nostrils 2 (two) times daily. 60 mL 1   lamoTRIgine (LAMICTAL) 100 MG tablet Take 1 tablet (100 mg total) by mouth daily. Bridge to pt appt 10/30 30 tablet 2   Lancets (ONETOUCH DELICA PLUS LANCET33G) MISC USE 1 LANCET IN THE MORNING, AT NOON AND AT BEDTIME 100 each 10   methocarbamol (ROBAXIN-750) 750 MG tablet Take 1 tablet (750 mg total) by mouth every 8 (eight) hours as needed for muscle spasms. 45 tablet 0   MOUNJARO 5 MG/0.5ML Pen INJECT 5 MG UNDER THE SKIN WEEKLY 6 mL 3   Spacer/Aero-Holding Chambers (OPTICHAMBER DIAMOND-LG MASK) DEVI Use with inhaler 1 each 0   SUMAtriptan (IMITREX) 100 MG tablet Take 1 tablet (100 mg total) by mouth every 2 (two) hours as needed for migraine or headache. Hokulani Rogel repeat in 2 hours if headache persists or recurs. 10 tablet 5   traMADol (ULTRAM) 50 MG tablet Take 1 tablet (50 mg total) by mouth every 8 (eight) hours as needed. 90 tablet 5   No current facility-administered medications for this visit.    Allergies as of 02/10/2023 - Review Complete 01/06/2023  Allergen Reaction Noted   Topiramate  11/24/2018   Chlorhexidine gluconate Rash 02/09/2019   Tape Rash 02/09/2019    Family History  Problem Relation Age of Onset   Diabetes Mellitus II Mother    Lupus Mother    Arthritis Mother    Diabetes Mother     Hypertension Mother    Miscarriages / [place] Mother    Lung cancer Father        smoker/worked in factory   Alcohol abuse Father    Cancer Father        smoker, worked in Marine scientist   Early death Father    Hypertension Father    Diabetes Mellitus II Sister    Arthritis Sister    Diabetes Sister    Hypertension Sister    Obesity Sister    Diabetes Mellitus II Brother    Hypercholesterolemia Brother    Diabetes Brother    Cancer Maternal Grandmother        Either uterine or endometrial   Colon cancer Neg Hx    Breast cancer Neg Hx     Review of Systems:  Constitutional: No weight loss, fever, chills, weakness or fatigue HEENT: Eyes: No change in vision               Ears, Nose, Throat:  No change in hearing or congestion Skin: No rash or itching Cardiovascular: No chest pain, chest pressure or palpitations   Respiratory: No SOB or cough Gastrointestinal: See HPI and otherwise negative Genitourinary: No dysuria or change in urinary frequency Neurological: No headache, dizziness or syncope Musculoskeletal: No new muscle or joint pain Hematologic: No bleeding or bruising Psychiatric: No history of depression or anxiety    Physical Exam:  Vital signs: There were no vitals taken for this visit.  Constitutional:   Pleasant Caucasian female*** appears to be in NAD, Well developed, Well nourished, alert and cooperative Head:  Normocephalic and atraumatic. Eyes:   PEERL, EOMI. No icterus. Conjunctiva pink. Ears:  Normal auditory acuity. Neck:  Supple Throat: Oral cavity and pharynx without inflammation, swelling or lesion.  Respiratory: Respirations even and unlabored. Lungs clear to auscultation bilaterally.   No wheezes, crackles, or rhonchi.  Cardiovascular: Normal S1, S2. Regular rate and rhythm. No peripheral edema, cyanosis or pallor.  Gastrointestinal:  Soft, nondistended, nontender. No rebound or guarding. Normal bowel sounds. No appreciable masses or  hepatomegaly. Rectal:  Not performed.  Anoscopy: Msk:  Symmetrical without gross deformities. Without edema, no deformity or joint abnormality.  Neurologic:  Alert and  oriented x4;  grossly normal neurologically.  Skin:   Dry and intact without significant lesions or rashes. Psychiatric: Oriented to person, place and time. Demonstrates good judgement and reason without abnormal affect or behaviors.  RELEVANT LABS AND IMAGING: CBC    Latest Ref Rng & Units 04/12/2021   10:42 AM 04/14/2019    1:36 PM 01/06/2019   11:50 AM  CBC  WBC 4.0 - 10.5 K/uL 6.2  11.6  8.7   Hemoglobin 12.0 - 15.0 g/dL 09.8  11.9  14.7   Hematocrit 36.0 - 46.0 % 44.1  41.8  43.3   Platelets 150.0 - 400.0 K/uL 244.0  258  277      CMP     Latest Ref Rng & Units 12/10/2021    2:46 PM 04/12/2021   10:42 AM 04/14/2019    1:36 PM  CMP  Glucose 70 - 99 mg/dL 829  82  95   BUN 6 - 24 mg/dL 16  16  19    Creatinine 0.57 - 1.00 mg/dL 5.62  1.30  8.65   Sodium 134 - 144 mmol/L 142  141  140   Potassium 3.5 - 5.2 mmol/L 4.4  3.9 hemolyzed sample  3.4   Chloride 96 - 106 mmol/L 99  98  100   CO2 20 - 29 mmol/L 26  33  32   Calcium 8.7 - 10.2 mg/dL 78.4  9.5  9.5   Total Protein 6.0 - 8.5 g/dL 7.8  7.7  7.9   Total Bilirubin 0.0 - 1.2 mg/dL <6.9  0.4  0.3   Alkaline Phos 44 - 121 IU/L 95  66  55   AST 0 - 40 IU/L 27  23  16    ALT 0 - 32 IU/L 27  14  15       Lab Results  Component Value Date   TSH 2.300 12/10/2021    10/11/22 positive fecal occult   Assessment: 1. ***  Plan: 1. ***   Thank you for the courtesy of this consult. Please call me with any questions or concerns.  Karolyn Messing, FNP-C [facility] 02/07/2023, 4:07 PM  Cc: de [place], Buren Kos, MD

## 2023-02-10 ENCOUNTER — Other Ambulatory Visit (INDEPENDENT_AMBULATORY_CARE_PROVIDER_SITE_OTHER): Payer: Managed Care, Other (non HMO)

## 2023-02-10 ENCOUNTER — Ambulatory Visit: Payer: Managed Care, Other (non HMO) | Admitting: Gastroenterology

## 2023-02-10 ENCOUNTER — Encounter: Payer: Self-pay | Admitting: Gastroenterology

## 2023-02-10 VITALS — BP 108/68 | HR 96 | Ht 61.0 in | Wt 208.2 lb

## 2023-02-10 DIAGNOSIS — R194 Change in bowel habit: Secondary | ICD-10-CM | POA: Diagnosis not present

## 2023-02-10 DIAGNOSIS — K625 Hemorrhage of anus and rectum: Secondary | ICD-10-CM

## 2023-02-10 DIAGNOSIS — L290 Pruritus ani: Secondary | ICD-10-CM

## 2023-02-10 DIAGNOSIS — R195 Other fecal abnormalities: Secondary | ICD-10-CM

## 2023-02-10 LAB — CBC WITH DIFFERENTIAL/PLATELET
Basophils Absolute: 0.1 10*3/uL (ref 0.0–0.1)
Basophils Relative: 0.8 % (ref 0.0–3.0)
Eosinophils Absolute: 0.1 10*3/uL (ref 0.0–0.7)
Eosinophils Relative: 1.7 % (ref 0.0–5.0)
HCT: 40.1 % (ref 36.0–46.0)
Hemoglobin: 13.1 g/dL (ref 12.0–15.0)
Lymphocytes Relative: 30.8 % (ref 12.0–46.0)
Lymphs Abs: 2 10*3/uL (ref 0.7–4.0)
MCHC: 32.8 g/dL (ref 30.0–36.0)
MCV: 90.2 fL (ref 78.0–100.0)
Monocytes Absolute: 0.5 10*3/uL (ref 0.1–1.0)
Monocytes Relative: 8.2 % (ref 3.0–12.0)
Neutro Abs: 3.9 10*3/uL (ref 1.4–7.7)
Neutrophils Relative %: 58.5 % (ref 43.0–77.0)
Platelets: 279 10*3/uL (ref 150.0–400.0)
RBC: 4.44 Mil/uL (ref 3.87–5.11)
RDW: 15.3 % (ref 11.5–15.5)
WBC: 6.6 10*3/uL (ref 4.0–10.5)

## 2023-02-10 LAB — COMPREHENSIVE METABOLIC PANEL
ALT: 10 U/L (ref 0–35)
AST: 13 U/L (ref 0–37)
Albumin: 3.9 g/dL (ref 3.5–5.2)
Alkaline Phosphatase: 77 U/L (ref 39–117)
BUN: 9 mg/dL (ref 6–23)
CO2: 29 meq/L (ref 19–32)
Calcium: 9.3 mg/dL (ref 8.4–10.5)
Chloride: 103 meq/L (ref 96–112)
Creatinine, Ser: 0.89 mg/dL (ref 0.40–1.20)
GFR: 70.15 mL/min (ref >60.00)
Glucose, Bld: 81 mg/dL (ref 70–99)
Potassium: 3.7 meq/L (ref 3.5–5.1)
Sodium: 139 meq/L (ref 135–145)
Total Bilirubin: 0.5 mg/dL (ref 0.2–1.2)
Total Protein: 7.1 g/dL (ref 6.0–8.3)

## 2023-02-10 LAB — TSH: TSH: 2.29 u[IU]/mL (ref 0.35–5.50)

## 2023-02-10 MED ORDER — SUTAB 1479-225-188 MG PO TABS: ORAL_TABLET | ORAL | 0 refills | Status: DC

## 2023-02-10 NOTE — Patient Instructions (Addendum)
Your provider has requested that you go to the basement level for lab work before leaving today. Press "B" on the elevator. The lab is located at the first door on the left as you exit the elevator.  You have been scheduled for a colonoscopy. Please follow written instructions given to you at your visit today.   Please pick up your prep supplies at the pharmacy within the next 1-3 days.  If you use inhalers (even only as needed), please bring them with you on the day of your procedure.  DO NOT TAKE 7 DAYS PRIOR TO TEST- Trulicity (dulaglutide) Ozempic, Wegovy (semaglutide) Mounjaro (tirzepatide) Bydureon Bcise (exanatide extended release)  DO NOT TAKE 1 DAY PRIOR TO YOUR TEST Rybelsus (semaglutide) Adlyxin (lixisenatide) Victoza (liraglutide) Byetta (exanatide) ___________________________________________________________________________  Kelsey Simmons will receive your bowel preparation through [facility], which ensures the lowest copay and home delivery, with outreach via text or call from an [phone] number. Please respond promptly to avoid rescheduling. If you are interested in alternative options or have any questions please contact them at [phone]  Your Provider Has Sent Your Bowel Prep Regimen To [facility] What to expect. [facility] will contact you to verify your information and collect your copay, if applicable. Enjoy the comfort of your home while we deliver your prescription to you, free of any shipping charges. Fast, FREE delivery or shipping. [facility] accepts all major insurance benefits and applies discounts & coupons  Have additional questions? [facility]'s patient care team is always here to help.  Chat: [email or web address] Call: [phone] Email: [email or web address] NCPDP: [id number] How will we contact you? Welcome Phone call  a Welcome text and a Checkout link in a text Texts you receive from [phone] Are Not Spam.   *To set up delivery, you must  complete the checkout process via link or speak to one of our patient care representatives. If we are unable to reach you, your prescription may be delayed.  Please purchase the following medications over the counter and take as directed: Start taking Citrucel daily   If your blood pressure at your visit was 140/90 or greater, please contact your primary care physician to follow up on this.  _______________________________________________________  If you are age 23 or older, your body mass index should be between 23-30. Your Body mass index is 39.34 kg/m. If this is out of the aforementioned range listed, please consider follow up with your Primary Care Provider.  If you are age 36 or younger, your body mass index should be between 19-25. Your Body mass index is 39.34 kg/m. If this is out of the aformentioned range listed, please consider follow up with your Primary Care Provider.   ________________________________________________________  The [facility] providers would like to encourage you to use [facility] to communicate with providers for non-urgent requests or questions.  Due to long hold times on the telephone, sending your provider a message by [facility] may be a faster and more efficient way to get a response.  Please allow 48 business hours for a response.  Please remember that this is for non-urgent requests.  _______________________________________________________

## 2023-02-13 NOTE — Telephone Encounter (Signed)
Inbound call from patient, following up on request below.

## 2023-02-16 NOTE — Progress Notes (Signed)
Agree with the assessment and plan as outlined by [facility], FNP-C.  Carlitos Bottino, DO, [facility]

## 2023-02-17 ENCOUNTER — Telehealth (HOSPITAL_COMMUNITY): Payer: 59 | Admitting: Psychiatry

## 2023-02-17 ENCOUNTER — Ambulatory Visit (HOSPITAL_BASED_OUTPATIENT_CLINIC_OR_DEPARTMENT_OTHER): Payer: Managed Care, Other (non HMO) | Admitting: Family Medicine

## 2023-02-17 ENCOUNTER — Telehealth: Payer: Self-pay

## 2023-02-17 ENCOUNTER — Other Ambulatory Visit: Payer: Self-pay

## 2023-02-17 NOTE — Telephone Encounter (Signed)
PT is calling to switch prep from pills to golytely. Please advise.

## 2023-02-17 NOTE — Telephone Encounter (Signed)
Spoke to patient regarding prep advised patient we have sample in office patient stated she will coming to office tomorrow to pick up prep on 3rd floor at the front desk.

## 2023-02-18 ENCOUNTER — Telehealth (HOSPITAL_COMMUNITY): Payer: 59 | Admitting: Psychiatry

## 2023-02-19 ENCOUNTER — Telehealth (HOSPITAL_BASED_OUTPATIENT_CLINIC_OR_DEPARTMENT_OTHER): Payer: Self-pay | Admitting: *Deleted

## 2023-02-19 NOTE — Telephone Encounter (Signed)
Pt is scheduled in March missed appt because she thought the office was here but has since followed up and is on the schedule for colonoscopy

## 2023-02-19 NOTE — Telephone Encounter (Signed)
-----   Message from Hosie Poisson Peru sent at 02/19/2023  2:53 PM EST ----- Regarding: FW: Clinic no-show Please reach out to patient regarding arranging appointment with GI. ----- Message ----- From: Sherrilyn Rist, MD Sent: 02/06/2023   3:17 PM EST To: Raymond J de Peru, MD Subject: Clinic no-show                                 Dr. Ihor Dow,  This patient of yours was scheduled to see me in clinic this afternoon for a positive Cologuard test.  Unfortunately, she did not show to the appointment.  If your staff is able to reach her and discovered that she is interested in pursuing a colonoscopy, please let me know and we will do our best to expedite that.  Amada Jupiter, [facility]

## 2023-02-20 ENCOUNTER — Telehealth (HOSPITAL_COMMUNITY): Payer: 59 | Admitting: Psychiatry

## 2023-02-22 ENCOUNTER — Other Ambulatory Visit (HOSPITAL_COMMUNITY): Payer: Self-pay | Admitting: Psychiatry

## 2023-02-22 DIAGNOSIS — F330 Major depressive disorder, recurrent, mild: Secondary | ICD-10-CM

## 2023-02-22 DIAGNOSIS — F411 Generalized anxiety disorder: Secondary | ICD-10-CM

## 2023-02-26 ENCOUNTER — Encounter (HOSPITAL_BASED_OUTPATIENT_CLINIC_OR_DEPARTMENT_OTHER): Payer: Self-pay | Admitting: Family Medicine

## 2023-02-26 ENCOUNTER — Ambulatory Visit (HOSPITAL_BASED_OUTPATIENT_CLINIC_OR_DEPARTMENT_OTHER): Payer: Managed Care, Other (non HMO) | Admitting: Family Medicine

## 2023-02-26 VITALS — BP 110/68 | HR 70 | Ht 61.0 in | Wt 208.2 lb

## 2023-02-26 DIAGNOSIS — Z7985 Long-term (current) use of injectable non-insulin antidiabetic drugs: Secondary | ICD-10-CM

## 2023-02-26 DIAGNOSIS — I10 Essential (primary) hypertension: Secondary | ICD-10-CM

## 2023-02-26 DIAGNOSIS — E782 Mixed hyperlipidemia: Secondary | ICD-10-CM

## 2023-02-26 DIAGNOSIS — E1169 Type 2 diabetes mellitus with other specified complication: Secondary | ICD-10-CM

## 2023-02-26 DIAGNOSIS — G43109 Migraine with aura, not intractable, without status migrainosus: Secondary | ICD-10-CM

## 2023-02-26 MED ORDER — AMLODIPINE BESYLATE 5 MG PO TABS
2.5000 mg | ORAL_TABLET | Freq: Every day | ORAL | 3 refills | Status: AC
Start: 2023-02-26 — End: ?

## 2023-02-26 MED ORDER — AMLODIPINE BESYLATE 5 MG PO TABS
5.0000 mg | ORAL_TABLET | Freq: Every day | ORAL | 3 refills | Status: DC
Start: 2023-02-26 — End: 2023-02-26

## 2023-02-26 MED ORDER — GABAPENTIN 600 MG PO TABS: 600.0000 mg | ORAL_TABLET | Freq: Three times a day (TID) | ORAL | 3 refills | Status: AC

## 2023-02-26 MED ORDER — ATORVASTATIN CALCIUM 20 MG PO TABS: 20.0000 mg | ORAL_TABLET | Freq: Every day | ORAL | 3 refills | Status: AC

## 2023-02-26 NOTE — Patient Instructions (Addendum)
 Please reduce your Amlodipine  to 2.5 mg per day (1/2 tablet)   Continue to monitor your Blood pressure.   Qulipta : Take 1 tablet daily for migraine prevention. If you would like to switch to this permanently after trialing the sample, let us  know. It would be $25/month.

## 2023-02-26 NOTE — Progress Notes (Signed)
 Subjective:   Kelsey Simmons [birth year] 02/26/2023  Chief Complaint  Patient presents with   Medical Management of Chronic Issues    Patient has concerns about a cough that she has had for over a month and is coughing up yellow phlegm. Also states she has been having problems with her migraines after BP meds were changed and has a spot on her abdomen and under her arm that she wants to have looked at.    HPI: DELYNDA Simmons presents today for re-assessment and management of chronic medical conditions.  DIABETES MELLITUS: [name] presents for the medical management of diabetes.  Current diabetes medication regimen: Mounjaro  5mg  weekly  Patient is  adhering to a diabetic diet.  Patient is not exercising regularly.  Patient is  checking BS regularly. Avg: 70's-120's/130's after meals  Patient is  checking their feet regularly.  Denies polydipsia, polyphagia, polyuria, open wounds or ulcers on feet.   Lab Results  Component Value Date   HGBA1C 6.6 (H) 09/04/2022    Lab Results  Component Value Date   LABMICR 21.7 09/04/2022   MICROALBUR <0.7 04/12/2021    Wt Readings from Last 3 Encounters:  02/26/23 208 lb 3.2 oz (94.4 kg)  02/10/23 208 lb 3.2 oz (94.4 kg)  12/09/22 212 lb (96.2 kg)    HYPERTENSION: Kelsey Simmons presents for the medical management of hypertension.  Patient's current hypertension medication regimen is: Amlodipine  5mg   Patient is  currently taking prescribed medications for HTN.  Patient is  regularly keeping a check on BP at home.  Adhering to low sodium diet: Yes Exercising Regularly: Not currently  Denies  dizziness, CP, SHOB.    Average: Readings in the past month:   97/70 71/53 95/66  111/75 121/73 116/66   BP Readings from Last 3 Encounters:  02/26/23 110/68  02/10/23 108/68  01/06/23 99/67    HEADACHE: Patient reports history of chronic migraines for several years.  She states she has been getting approximately 15  migraines per month and has started to notice an increase in frequency of migraines.  She usually takes Imitrex  for relief and has also noticed recent relief while taking Excedrin in the past 24 hours.  She is currently asymptomatic at time of visit.  She would be interested in other treatment modalities as she states with starting her amlodipine  in the past her migraine frequency decreased and she is concerned about changing dosage of amlodipine  in regards to migraine frequency. She has been having some increase in migraines and some blurred vision with starting at cell phone, and computer.   Treatments attempted: Treatments attempted: Imitrex , Topamax (could not tolerate due to double vision) , Prolpanolol     Headache status at time of visit: asymptomatic  HYPERLIPIDEMIA: Kelsey Simmons presents for the medical management of hyperlipidemia.  Patient's current HLD regimen is: Atorvastatin  20mg  nightly  Patient is  currently taking prescribed medications for HLD.  Adhering to heathy diet: Yes Denies myalgias.  Lab Results  Component Value Date   CHOL 223 (H) 04/24/2022   HDL 115 04/24/2022   LDLCALC 97 04/24/2022   TRIG 64 04/24/2022   CHOLHDL 1.9 04/24/2022    The following portions of the patient's history were reviewed and updated as appropriate: past medical history, past surgical history, family history, social history, allergies, medications, and problem list.   Patient Active Problem List   Diagnosis Date Noted   Arthritis of right shoulder region 11/18/2022   Impingement  syndrome of right shoulder 11/18/2022   Chronic right shoulder pain 09/04/2022   Myofascial pain 09/04/2022   Cough 08/12/2022   Bilateral knee pain 07/08/2022   Bilateral foot pain 04/04/2022   Bilateral ankle pain 04/04/2022   Encounter for therapeutic drug monitoring 02/21/2022   Encounter for long-term opiate analgesic use 02/21/2022   Chronic bilateral low back pain with sciatica 12/10/2021   Lumbar  facet arthropathy 12/10/2021   Chronic pain syndrome 12/10/2021   Neuropathic pain 12/10/2021   Depression, major, single episode, mild (HCC) 11/15/2021   Rash 04/13/2021   Bone spur of foot 07/31/2020   Cervical spondylitis (HCC) 03/02/2020   BMI 40.0-44.9, adult (HCC) 07/06/2019   Ovarian cyst, left 04/09/2019   Exposure to severe acute respiratory syndrome coronavirus 2 (SARS-CoV-2) 02/13/2019   Abdominal pain 02/09/2019   Acquired spondylolisthesis 02/09/2019   Allergic rhinitis 02/09/2019   Constipation 02/09/2019   Fibrocystic breast changes 02/09/2019   Fibromyalgia 02/09/2019   Gastroesophageal reflux disease without esophagitis 02/09/2019   Iron deficiency anemia 02/09/2019   Migraine with aura 02/09/2019   Pure hypercholesterolemia 02/09/2019   Bilateral sacroiliitis (HCC) 01/26/2019   Diabetes mellitus without complication (HCC) 09/22/2018   Hypertension 04/12/2015   History of total knee arthroplasty 04/13/2014   Prolapsed lumbar disc 10/14/2013   Osteoarthritis of right knee 08/18/2013   Hx of total knee arthroplasty 08/18/2013   Inflammation of sacroiliac joint (HCC) 11/09/2012   Past Medical History:  Diagnosis Date   Allergy    Anxiety    Arthritis    Bilateral sacroiliitis (HCC)    Chronic back pain    Depression 2020   in therapy but not on medications   Diabetes mellitus without complication (HCC)    type 2    Fibromyalgia    Headache(784.0)    HX MIGRAINES   History of kidney stones 2020   left parenchymal stone identified on CT 11/23/18   kidney cyst   Hypercholesterolemia    Hypertension    Lumbar spondylosis    Lumbosacral neuritis    Myofascial pain    Paresthesia of lower extremity    Sleep apnea    HAS NOT USED C PAP SINCE 2007 DUE TO WT LOSS    Trochanteric bursitis    left hip   Past Surgical History:  Procedure Laterality Date   ABDOMINAL HYSTERECTOMY  01/22/1995   BUNIONECTOMY  01/21/2010   CESAREAN SECTION     GASTRIC BYPASS   01/22/2004   JOINT REPLACEMENT     RTKA 7'15   OVARY SURGERY     ROBOTIC ASSISTED LAPAROSCOPIC LYSIS OF ADHESION N/A 04/19/2019   Procedure: XI ROBOTIC ASSISTED LAPAROSCOPIC LYSIS OF ADHESION;  Surgeon: Viktoria Comer SAUNDERS, MD;  Location: [facility];  Service: Gynecology;  Laterality: N/A;   ROBOTIC ASSISTED SALPINGO OOPHERECTOMY Left 04/19/2019   Procedure: XI ROBOTIC ASSISTED SALPINGO OOPHORECTOMY;  Surgeon: Viktoria Comer SAUNDERS, MD;  Location: [facility];  Service: Gynecology;  Laterality: Left;   TOTAL KNEE ARTHROPLASTY Right 08/18/2013   Procedure: RIGHT TOTAL KNEE ARTHROPLASTY;  Surgeon: Tanda DELENA Heading, MD;  Location: [facility];  Service: Orthopedics;  Laterality: Right;   TOTAL KNEE ARTHROPLASTY Left 04/13/2014   Procedure: LEFT TOTAL KNEE ARTHROPLASTY;  Surgeon: Tanda Heading, MD;  Location: [facility];  Service: Orthopedics;  Laterality: Left;   VENTRAL HERNIA REPAIR N/A 01/11/2019   Procedure: LAPAROSCOPIC VENTRAL HERNIA REPAIR WITH MESH;  Surgeon: Paola Dreama SAILOR, MD;  Location: [facility];  Service: General;  Laterality: N/A;  Family History  Problem Relation Age of Onset   Diabetes Mellitus II Mother    Lupus Mother    Arthritis Mother    Diabetes Mother    Hypertension Mother    Miscarriages / Stillbirths Mother    Lung cancer Father        smoker/worked in factory   Alcohol abuse Father    Cancer Father        smoker, worked in marine scientist   Early death Father    Hypertension Father    Diabetes Mellitus II Sister    Arthritis Sister    Diabetes Sister    Hypertension Sister    Obesity Sister    Diabetes Mellitus II Brother    Hypercholesterolemia Brother    Diabetes Brother    Cancer Maternal Grandmother        Either uterine or endometrial   Colon cancer Neg Hx    Esophageal cancer Neg Hx    Liver disease Neg Hx    Outpatient Medications Prior to Visit  Medication Sig Dispense Refill   albuterol  (VENTOLIN  HFA) 108 (90 Base) MCG/ACT inhaler Inhale 2 puffs into the lungs every  6 (six) hours as needed for wheezing or shortness of breath. 80.4 g 1   Blood Glucose Monitoring Suppl DEVI 1 each by Does not apply route in the morning, at noon, and at bedtime. One Touch Verio Flex per patients insurance 1 each 0   buPROPion  (WELLBUTRIN  XL) 300 MG 24 hr tablet Take 1 tablet (300 mg total) by mouth every morning. Patient is increasing from the 150 to 300 mg 30 tablet 2   Glucose Blood (BLOOD GLUCOSE TEST STRIPS) STRP 1 each by In Vitro route in the morning, at noon, and at bedtime. May substitute to any manufacturer covered by patient's insurance. 100 each 2   ipratropium (ATROVENT ) 0.03 % nasal spray Place 2 sprays into both nostrils 2 (two) times daily. 60 mL 1   lamoTRIgine  (LAMICTAL ) 100 MG tablet Take 1 tablet (100 mg total) by mouth daily. Bridge to pt appt 10/30 30 tablet 2   Lancets (ONETOUCH DELICA PLUS LANCET33G) MISC USE 1 LANCET IN THE MORNING, AT NOON AND AT BEDTIME 100 each 10   methocarbamol  (ROBAXIN -750) 750 MG tablet Take 1 tablet (750 mg total) by mouth every 8 (eight) hours as needed for muscle spasms. 45 tablet 0   MOUNJARO  5 MG/0.5ML Pen INJECT 5 MG UNDER THE SKIN WEEKLY 6 mL 3   Sodium Sulfate-Mag Sulfate-KCl (SUTAB ) 1479-225-188 MG TABS Use as directed for colonoscopy. MANUFACTURER CODES!! BIN: [id number] PCN: CN GROUP: [id number] MEMBER ID: [id number];MLW AS SECONDARY INSURANCE ;NO PRIOR AUTHORIZATION 24 tablet 0   SUMAtriptan  (IMITREX ) 100 MG tablet Take 1 tablet (100 mg total) by mouth every 2 (two) hours as needed for migraine or headache. May repeat in 2 hours if headache persists or recurs. 10 tablet 5   traMADol  (ULTRAM ) 50 MG tablet Take 1 tablet (50 mg total) by mouth every 8 (eight) hours as needed. 90 tablet 5   amLODipine  (NORVASC ) 5 MG tablet TAKE 1 TABLET DAILY 90 tablet 3   atorvastatin  (LIPITOR) 20 MG tablet TAKE 1 TABLET DAILY 90 tablet 3   gabapentin  (NEURONTIN ) 600 MG tablet TAKE 1 TABLET THREE TIMES A DAY 270 tablet 3   clotrimazole  (LOTRIMIN )  1 % cream Apply 1 Application topically 2 (two) times daily. (Patient not taking: Reported on 02/26/2023) 30 g 1   hydrochlorothiazide  (HYDRODIURIL ) 25 MG tablet TAKE 1 TABLET EVERY  MORNING 90 tablet 3   Spacer/Aero-Holding Chambers (OPTICHAMBER DIAMOND -LG MASK) DEVI Use with inhaler 1 each 0   No facility-administered medications prior to visit.   Allergies  Allergen Reactions   Topiramate     Other reaction(s): double vision   Chlorhexidine  Gluconate Rash   Tape Rash    DERMABOND     ROS: A complete ROS was performed with pertinent positives/negatives noted in the HPI. The remainder of the ROS are negative.    Objective:   Today's Vitals   02/26/23 0826 02/26/23 0835  BP: 132/76 110/68  Pulse: 70   SpO2: 100%   Weight: 208 lb 3.2 oz (94.4 kg)   Height: 5' 1 (1.549 m)     Physical Exam          GENERAL: Well-appearing, in NAD. Well nourished.  SKIN: Pink, warm and dry.  Head: Normocephalic. NECK: Trachea midline. Full ROM w/o pain or tenderness.  RESPIRATORY: Chest wall symmetrical. Respirations even and non-labored.  MSK: Muscle tone and strength appropriate for age.  NEUROLOGIC: No motor or sensory deficits. Steady, even gait. C2-C12 intact.  PSYCH/MENTAL STATUS: Alert, oriented x 3. Cooperative, appropriate mood and affect.      Assessment & Plan:  1. Primary hypertension (Primary) Well-controlled, concern for possible hypotension given patient's readings at home and lower blood pressure value in office today.  I recommend the patient decrease her amlodipine  from 5 mg to 2.5 mg daily.  I recommend that she keep an eye on her heart rate and blood pressure while taking this medication and to stop amlodipine  if blood pressure is less than 100/60.  Patient agreeable and will monitor. - amLODipine  (NORVASC ) 5 MG tablet; Take 0.5 tablets (2.5 mg total) by mouth daily.  Dispense: 90 tablet; Refill: 3  2. Type 2 diabetes mellitus with other specified complication, without  long-term current use of insulin (HCC) Will obtain A1c today with fasting lab work to evaluate for control of type 2 diabetes.  Pending her lab results, we will determine whether we stay at 5 mg of Mounjaro  or increase her dosage.  She is tolerating this well and denies adverse side effects currently.  Discussed good dietary changes. - Hemoglobin A1c  3. Mixed hyperlipidemia Will obtain fasting lipid panel today to evaluate for control of hyperlipidemia while on statin therapy.  Patient denies myalgias from statin therapy currently.  Recent CMP from 02/10/2023 reviewed by PCP. - Lipid panel  4. Migraine with aura and without status migrainosus, not intractable Due to increased frequency of migraine headaches, recommend patient try a preventative medication of Qulipta  60 mg daily.  Sample provided to patient and safe use of medication reviewed with patient.  She will try the sample and if improving frequency of headaches, she will reach out to PCP for prescription.  Discussed not to combine this with use of sumatriptan  or Excedrin.   Meds ordered this encounter  Medications   DISCONTD: amLODipine  (NORVASC ) 5 MG tablet    Sig: Take 1 tablet (5 mg total) by mouth daily.    Dispense:  90 tablet    Refill:  3    Supervising Provider:   DE CUBA, RAYMOND J [[id number]]   atorvastatin  (LIPITOR) 20 MG tablet    Sig: Take 1 tablet (20 mg total) by mouth daily.    Dispense:  90 tablet    Refill:  3    Supervising Provider:   DE CUBA, RAYMOND J [[id number]]   gabapentin  (NEURONTIN ) 600 MG tablet  Sig: Take 1 tablet (600 mg total) by mouth 3 (three) times daily.    Dispense:  270 tablet    Refill:  3    Supervising Provider:   DE CUBA, RAYMOND J [[id number]]   amLODipine  (NORVASC ) 5 MG tablet    Sig: Take 0.5 tablets (2.5 mg total) by mouth daily.    Dispense:  90 tablet    Refill:  3    Supervising Provider:   DE CUBA, RAYMOND J [[id number]]   Lab Orders         Lipid panel         Hemoglobin A1c       Return in about 6 weeks (around 04/09/2023) for Follow up BP, Migraine Frequency, and Concerns .    Patient to reach out to office if new, worrisome, or unresolved symptoms arise or if no improvement in patient's condition. Patient verbalized understanding and is agreeable to treatment plan. All questions answered to patient's satisfaction.    Thersia Schuyler Stark, [place]

## 2023-02-27 ENCOUNTER — Encounter (HOSPITAL_BASED_OUTPATIENT_CLINIC_OR_DEPARTMENT_OTHER): Payer: Self-pay | Admitting: Family Medicine

## 2023-02-27 ENCOUNTER — Other Ambulatory Visit (HOSPITAL_BASED_OUTPATIENT_CLINIC_OR_DEPARTMENT_OTHER): Payer: Self-pay | Admitting: Family Medicine

## 2023-02-27 DIAGNOSIS — E1169 Type 2 diabetes mellitus with other specified complication: Secondary | ICD-10-CM

## 2023-02-27 LAB — LIPID PANEL
Chol/HDL Ratio: 1.9 {ratio} (ref 0.0–4.4)
Cholesterol, Total: 182 mg/dL (ref 100–199)
HDL: 98 mg/dL (ref >39)
LDL Chol Calc (NIH): 71 mg/dL (ref 0–99)
Triglycerides: 67 mg/dL (ref 0–149)
VLDL Cholesterol Cal: 13 mg/dL (ref 5–40)

## 2023-02-27 LAB — HEMOGLOBIN A1C
Est. average glucose Bld gHb Est-mCnc: 120 mg/dL
Hgb A1c MFr Bld: 5.8 % — ABNORMAL HIGH (ref 4.8–5.6)

## 2023-02-27 MED ORDER — MOUNJARO 5 MG/0.5ML ~~LOC~~ SOAJ
5.0000 mg | SUBCUTANEOUS | 3 refills | Status: DC
Start: 2023-02-27 — End: 2023-11-04

## 2023-02-27 NOTE — Progress Notes (Signed)
 Hi Kelsey Simmons:  Your A1C is excellent at 5.8 and your diabetes is very well controlled on your current dosage of Mounjaro . We will continue on this current dosage and I have sent in refills for you. Your cholesterol is also very well controlled on your current atorvastatin . Please keep up the good work!

## 2023-03-03 ENCOUNTER — Encounter (HOSPITAL_COMMUNITY): Payer: Self-pay | Admitting: Psychiatry

## 2023-03-03 ENCOUNTER — Other Ambulatory Visit (HOSPITAL_COMMUNITY): Payer: Self-pay | Admitting: Psychiatry

## 2023-03-03 ENCOUNTER — Telehealth (HOSPITAL_BASED_OUTPATIENT_CLINIC_OR_DEPARTMENT_OTHER): Payer: 59 | Admitting: Psychiatry

## 2023-03-03 ENCOUNTER — Ambulatory Visit (HOSPITAL_BASED_OUTPATIENT_CLINIC_OR_DEPARTMENT_OTHER): Payer: Managed Care, Other (non HMO) | Admitting: Family Medicine

## 2023-03-03 VITALS — Wt 208.0 lb

## 2023-03-03 DIAGNOSIS — F411 Generalized anxiety disorder: Secondary | ICD-10-CM

## 2023-03-03 DIAGNOSIS — F330 Major depressive disorder, recurrent, mild: Secondary | ICD-10-CM

## 2023-03-03 MED ORDER — BUPROPION HCL ER (XL) 300 MG PO TB24: 300.0000 mg | ORAL_TABLET | ORAL | 2 refills | Status: DC

## 2023-03-03 NOTE — Progress Notes (Signed)
 [facility] MD Virtual Progress Note   Patient Location: Home Provider Location: Home Office  I connect with patient by video and verified that I am speaking with correct person by using two identifiers. I discussed the limitations of evaluation and management by telemedicine and the availability of in person appointments. I also discussed with the patient that there may be a patient responsible charge related to this service. The patient expressed understanding and agreed to proceed.  Kelsey Simmons [id number] 63 y.o.  03/03/2023 11:13 AM  History of Present Illness:  Patient is evaluated by video session.  She is taking Wellbutrin  XL 300 mg after 150 did not help as much.  She reported things are going well.  Her job is same and sometimes she does not like micromanagement but otherwise no major concern.  She was thinking to switch her job but she feels her job is easy but did not like the politics around the job.  Patient told had a flulike symptoms but doing better now.  She had a cruise trip with the family and she had a very good time.  Patient told yesterday she was helping her 64 year old daughter who has autoimmune disease and could not take care of herself.  She admitted having some crying because there are days when daughter really needs some help and she feels bad about her condition.  Patient's daughter live close by.  She is in therapy with Mia Adam.  She denies any suicidal thoughts or homicidal thoughts.  She has no tremors shakes or any EPS.  Patient lives with her husband who is very supportive.  Recently she had labs.  She was very pleased as labs are much better.  Her hemoglobin A1c down.  Her cholesterol is normal.  She also noticed weight loss as trying to focus on her general health.  She denies any mania, psychosis, hallucination or any suicidal thoughts.  She like to keep the current medication that is helping her anxiety and depression.  Past Psychiatric  History: H/O drug use and brief psychiatric inpatient in mid 37s for passive suicidal thoughts.  No history of attempt.  H/O twice rehab program.  H/O marijuana, crack and cocaine use.  Tried Paxil at that time but never consistent with treatment and follow-up.  PCP tried Cymbalta  up to 120 mg but caused diplopia. Never seen psychiatrist before. No h/o psychosis, mania.    Outpatient Encounter Medications as of 03/03/2023  Medication Sig   albuterol  (VENTOLIN  HFA) 108 (90 Base) MCG/ACT inhaler Inhale 2 puffs into the lungs every 6 (six) hours as needed for wheezing or shortness of breath.   amLODipine  (NORVASC ) 5 MG tablet Take 0.5 tablets (2.5 mg total) by mouth daily.   atorvastatin  (LIPITOR) 20 MG tablet Take 1 tablet (20 mg total) by mouth daily.   Blood Glucose Monitoring Suppl DEVI 1 each by Does not apply route in the morning, at noon, and at bedtime. One Touch Verio Flex per patients insurance   buPROPion  (WELLBUTRIN  XL) 300 MG 24 hr tablet Take 1 tablet (300 mg total) by mouth every morning. Patient is increasing from the 150 to 300 mg   gabapentin  (NEURONTIN ) 600 MG tablet Take 1 tablet (600 mg total) by mouth 3 (three) times daily.   Glucose Blood (BLOOD GLUCOSE TEST STRIPS) STRP 1 each by In Vitro route in the morning, at noon, and at bedtime. May substitute to any manufacturer covered by patient's insurance.   ipratropium (ATROVENT ) 0.03 % nasal spray Place  2 sprays into both nostrils 2 (two) times daily.   lamoTRIgine  (LAMICTAL ) 100 MG tablet Take 1 tablet (100 mg total) by mouth daily. Bridge to pt appt 10/30   Lancets (ONETOUCH DELICA PLUS LANCET33G) MISC USE 1 LANCET IN THE MORNING, AT NOON AND AT BEDTIME   methocarbamol  (ROBAXIN -750) 750 MG tablet Take 1 tablet (750 mg total) by mouth every 8 (eight) hours as needed for muscle spasms.   Sodium Sulfate-Mag Sulfate-KCl (SUTAB ) 1479-225-188 MG TABS Use as directed for colonoscopy. MANUFACTURER CODES!! BIN: [id number] PCN: CN GROUP:  [id number] MEMBER ID: [id number];GNF AS SECONDARY INSURANCE ;NO PRIOR AUTHORIZATION   SUMAtriptan  (IMITREX ) 100 MG tablet Take 1 tablet (100 mg total) by mouth every 2 (two) hours as needed for migraine or headache. May repeat in 2 hours if headache persists or recurs.   tirzepatide  (MOUNJARO ) 5 MG/0.5ML Pen Inject 5 mg into the skin once a week.   traMADol  (ULTRAM ) 50 MG tablet Take 1 tablet (50 mg total) by mouth every 8 (eight) hours as needed.   No facility-administered encounter medications on file as of 03/03/2023.    Recent Results (from the past 2160 hours)  Comprehensive metabolic panel     Status: None   Collection Time: 02/10/23 12:24 PM  Result Value Ref Range   Sodium 139 135 - 145 mEq/L   Potassium 3.7 3.5 - 5.1 mEq/L   Chloride 103 96 - 112 mEq/L   CO2 29 19 - 32 mEq/L   Glucose, Bld 81 70 - 99 mg/dL   BUN 9 6 - 23 mg/dL   Creatinine, Ser 6.21 0.40 - 1.20 mg/dL   Total Bilirubin 0.5 0.2 - 1.2 mg/dL   Alkaline Phosphatase 77 39 - 117 U/L   AST 13 0 - 37 U/L   ALT 10 0 - 35 U/L   Total Protein 7.1 6.0 - 8.3 g/dL   Albumin  3.9 3.5 - 5.2 g/dL   GFR 30.86 >57.84 mL/min    Comment: Calculated using the CKD-EPI Creatinine Equation (2021)   Calcium  9.3 8.4 - 10.5 mg/dL  TSH     Status: None   Collection Time: 02/10/23 12:24 PM  Result Value Ref Range   TSH 2.29 0.35 - 5.50 uIU/mL  CBC with Differential/Platelet     Status: None   Collection Time: 02/10/23 12:24 PM  Result Value Ref Range   WBC 6.6 4.0 - 10.5 K/uL   RBC 4.44 3.87 - 5.11 Mil/uL   Hemoglobin 13.1 12.0 - 15.0 g/dL   HCT 69.6 29.5 - 28.4 %   MCV 90.2 78.0 - 100.0 fl   MCHC 32.8 30.0 - 36.0 g/dL   RDW 13.2 44.0 - 10.2 %   Platelets 279.0 150.0 - 400.0 K/uL   Neutrophils Relative % 58.5 43.0 - 77.0 %   Lymphocytes Relative 30.8 12.0 - 46.0 %   Monocytes Relative 8.2 3.0 - 12.0 %   Eosinophils Relative 1.7 0.0 - 5.0 %   Basophils Relative 0.8 0.0 - 3.0 %   Neutro Abs 3.9 1.4 - 7.7 K/uL   Lymphs Abs 2.0  0.7 - 4.0 K/uL   Monocytes Absolute 0.5 0.1 - 1.0 K/uL   Eosinophils Absolute 0.1 0.0 - 0.7 K/uL   Basophils Absolute 0.1 0.0 - 0.1 K/uL  Lipid panel     Status: None   Collection Time: 02/26/23  9:16 AM  Result Value Ref Range   Cholesterol, Total 182 100 - 199 mg/dL   Triglycerides 67 0 - 149 mg/dL  HDL 98 >39 mg/dL   VLDL Cholesterol Cal 13 5 - 40 mg/dL   LDL Chol Calc (NIH) 71 0 - 99 mg/dL   Chol/HDL Ratio 1.9 0.0 - 4.4 ratio    Comment:                                   T. Chol/HDL Ratio                                             Men  Women                               1/2 Avg.Risk  3.4    3.3                                   Avg.Risk  5.0    4.4                                2X Avg.Risk  9.6    7.1                                3X Avg.Risk 23.4   11.0   Hemoglobin A1c     Status: Abnormal   Collection Time: 02/26/23  9:16 AM  Result Value Ref Range   Hgb A1c MFr Bld 5.8 (H) 4.8 - 5.6 %    Comment:          Prediabetes: 5.7 - 6.4          Diabetes: >6.4          Glycemic control for adults with diabetes: <7.0    Est. average glucose Bld gHb Est-mCnc 120 mg/dL     Psychiatric Specialty Exam: Physical Exam  Review of Systems  Weight 208 lb (94.3 kg).There is no height or weight on file to calculate BMI.  General Appearance: Casual  Eye Contact:  Good  Speech:  Normal Rate  Volume:  Normal  Mood:   tearful when taking about her daughter  Affect:  Congruent  Thought Process:  Goal Directed  Orientation:  Full (Time, Place, and Person)  Thought Content:  Logical  Suicidal Thoughts:  No  Homicidal Thoughts:  No  Memory:  Immediate;   Good Recent;   Good Remote;   Good  Judgement:  Intact  Insight:  Present  Psychomotor Activity:  Normal  Concentration:  Concentration: Good and Attention Span: Good  Recall:  Good  Fund of Knowledge:  Good  Language:  Good  Akathisia:  No  Handed:  Right  AIMS (if indicated):     Assets:  Communication Skills Desire for  Improvement Housing Social Support Talents/Skills Transportation  ADL's:  Intact  Cognition:  WNL  Sleep:  ok     Assessment/Plan: MDD (major depressive disorder), recurrent episode, mild (HCC) - Plan: buPROPion  (WELLBUTRIN  XL) 300 MG 24 hr tablet  GAD (generalized anxiety disorder) - Plan: buPROPion  (WELLBUTRIN  XL) 300 MG 24 hr tablet  Patient is stable on Wellbutrin  XL 300 mg daily.  She is  tolerating better now as before she could not handle the higher dose.  Reviewed blood work results.  Labs are improved.  Hemoglobin A1c went down.  Encouraged to continue therapy with Joanne.  Recommended to call us  back if she has any question or any concern.  Follow-up in 3 months.   Follow Up Instructions:     I discussed the assessment and treatment plan with the patient. The patient was provided an opportunity to ask questions and all were answered. The patient agreed with the plan and demonstrated an understanding of the instructions.   The patient was advised to call back or seek an in-person evaluation if the symptoms worsen or if the condition fails to improve as anticipated.    Collaboration of Care: Other provider involved in patient's care AEB notes are available in epic to review  Patient/Guardian was advised Release of Information must be obtained prior to any record release in order to collaborate their care with an outside provider. Patient/Guardian was advised if they have not already done so to contact the registration department to sign all necessary forms in order for us  to release information regarding their care.   Consent: Patient/Guardian gives verbal consent for treatment and assignment of benefits for services provided during this visit. Patient/Guardian expressed understanding and agreed to proceed.     I provided 18 minutes of non face to face time during this encounter.  Note: This document was prepared by [employer] voice dictation technology and any errors that  results from this process are unintentional.    Arturo Late, MD 03/03/2023

## 2023-03-04 ENCOUNTER — Ambulatory Visit
Admission: RE | Admit: 2023-03-04 | Discharge: 2023-03-04 | Disposition: A | Discharge status: Home / Self Care | Payer: Managed Care, Other (non HMO) | Source: Ambulatory Visit | Attending: Registered Nurse

## 2023-03-06 ENCOUNTER — Telehealth (HOSPITAL_COMMUNITY): Payer: Self-pay | Admitting: *Deleted

## 2023-03-06 ENCOUNTER — Ambulatory Visit: Payer: Managed Care, Other (non HMO) | Admitting: Gastroenterology

## 2023-03-06 DIAGNOSIS — F330 Major depressive disorder, recurrent, mild: Secondary | ICD-10-CM

## 2023-03-06 DIAGNOSIS — F411 Generalized anxiety disorder: Secondary | ICD-10-CM

## 2023-03-06 MED ORDER — LAMOTRIGINE 100 MG PO TABS
100.0000 mg | ORAL_TABLET | Freq: Every day | ORAL | 0 refills | Status: DC
Start: 2023-03-06 — End: 2023-05-27

## 2023-03-06 NOTE — Telephone Encounter (Signed)
Pt called requesting 90 day supply of the Lamictal 100 mg every day. Pt was just seen on 03/03/23 and has f/u scheduled for 04/09/23. Please review.

## 2023-03-06 NOTE — Telephone Encounter (Signed)
Done

## 2023-03-17 ENCOUNTER — Encounter: Payer: Self-pay | Admitting: Gastroenterology

## 2023-03-19 ENCOUNTER — Ambulatory Visit (HOSPITAL_BASED_OUTPATIENT_CLINIC_OR_DEPARTMENT_OTHER): Payer: Managed Care, Other (non HMO) | Admitting: Family Medicine

## 2023-03-24 ENCOUNTER — Encounter: Payer: Self-pay | Admitting: Certified Registered Nurse Anesthetist

## 2023-03-26 ENCOUNTER — Ambulatory Visit (AMBULATORY_SURGERY_CENTER): Payer: Managed Care, Other (non HMO) | Admitting: Gastroenterology

## 2023-03-26 ENCOUNTER — Encounter: Payer: Self-pay | Admitting: Gastroenterology

## 2023-03-26 VITALS — BP 147/82 | HR 87 | Temp 97.6°F | Resp 20 | Ht 61.0 in | Wt 208.0 lb

## 2023-03-26 DIAGNOSIS — Z1211 Encounter for screening for malignant neoplasm of colon: Secondary | ICD-10-CM | POA: Diagnosis present

## 2023-03-26 DIAGNOSIS — R195 Other fecal abnormalities: Secondary | ICD-10-CM | POA: Diagnosis not present

## 2023-03-26 DIAGNOSIS — K648 Other hemorrhoids: Secondary | ICD-10-CM | POA: Diagnosis not present

## 2023-03-26 DIAGNOSIS — D175 Benign lipomatous neoplasm of intra-abdominal organs: Secondary | ICD-10-CM | POA: Diagnosis not present

## 2023-03-26 DIAGNOSIS — K640 First degree hemorrhoids: Secondary | ICD-10-CM

## 2023-03-26 MED ORDER — SODIUM CHLORIDE 0.9 % IV SOLN: 500.0000 mL | Freq: Once | INTRAVENOUS | Status: AC

## 2023-03-26 NOTE — Op Note (Signed)
 [facility] Patient Name: Kelsey Simmons Procedure Date: 03/26/2023 1:56 PM MRN: [id number] Endoscopist: Doristine Locks , MD, [id number] Age: 63 Referring MD:  Date of Birth: July 25, 1960 Gender: Female Account #: [id number] Procedure:                Colonoscopy Indications:              Screening for colorectal malignant neoplasm (last                            colonoscopy was more than 10 years ago).                           Last colonoscopy in Jan 2014 at [facility]                            [facility]astro[facility]. Per patient normal.                           Recent FIT+ stool. No family history of colon                            cancer. Medicines:                Monitored Anesthesia Care Procedure:                Pre-Anesthesia Assessment:                           - Prior to the procedure, a History and Physical                            was performed, and patient medications and                            allergies were reviewed. The patient's tolerance of                            previous anesthesia was also reviewed. The risks                            and benefits of the procedure and the sedation                            options and risks were discussed with the patient.                            All questions were answered, and informed consent                            was obtained. Prior Anticoagulants: The patient has                            taken no anticoagulant or antiplatelet agents. ASA  Grade Assessment: II - A patient with mild systemic                            disease. After reviewing the risks and benefits,                            the patient was deemed in satisfactory condition to                            undergo the procedure.                           After obtaining informed consent, the colonoscope                            was passed under direct vision. Throughout the                            procedure, the  patient's blood pressure, pulse, and                            oxygen saturations were monitored continuously. The                            CF [id number] #[id number] was introduced through the anus                            and advanced to the the terminal ileum. The                            colonoscopy was performed without difficulty. The                            patient tolerated the procedure well. The quality                            of the bowel preparation was good. The terminal                            ileum, ileocecal valve, appendiceal orifice, and                            rectum were photographed. Scope In: 2:19:42 PM Scope Out: 2:32:25 PM Scope Withdrawal Time: 0 hours 7 minutes 54 seconds  Total Procedure Duration: 0 hours 12 minutes 43 seconds  Findings:                 The perianal and digital rectal examinations were                            normal.                           There was a medium-sized lipoma, in the descending  colon.                           The entire colon was otherwise appeared normal.                           Non-bleeding internal hemorrhoids were found during                            retroflexion. The hemorrhoids were small.                           The terminal ileum appeared normal. Complications:            No immediate complications. Estimated Blood Loss:     Estimated blood loss: none. Impression:               - Medium-sized benign lipoma in the descending                            colon.                           - The entire examined colon is otherwise normal.                           - Non-bleeding internal hemorrhoids.                           - The examined portion of the ileum was normal.                           - No specimens collected. Recommendation:           - Patient has a contact number available for                            emergencies. The signs and symptoms of potential                             delayed complications were discussed with the                            patient. Return to normal activities tomorrow.                            Written discharge instructions were provided to the                            patient.                           - Resume previous diet.                           - Continue present medications.                           -  Repeat colonoscopy in 10 years for screening                            purposes.                           - Return to GI office PRN. Doristine Locks, MD 03/26/2023 2:42:13 PM

## 2023-03-26 NOTE — Patient Instructions (Signed)
 Please read handouts provided. Continue present medications. Resume previous diet. Repeat colonoscopy in 10 years for screening. Return to GI office as needed.   YOU HAD AN ENDOSCOPIC PROCEDURE TODAY AT THE [facility]:   Refer to the procedure report that was given to you for any specific questions about what was found during the examination.  If the procedure report does not answer your questions, please call your gastroenterologist to clarify.  If you requested that your care partner not be given the details of your procedure findings, then the procedure report has been included in a sealed envelope for you to review at your convenience later.  YOU SHOULD EXPECT: Some feelings of bloating in the abdomen. Passage of more gas than usual.  Walking can help get rid of the air that was put into your GI tract during the procedure and reduce the bloating. If you had a lower endoscopy (such as a colonoscopy or flexible sigmoidoscopy) you may notice spotting of blood in your stool or on the toilet paper. If you underwent a bowel prep for your procedure, you may not have a normal bowel movement for a few days.  Please Note:  You might notice some irritation and congestion in your nose or some drainage.  This is from the oxygen used during your procedure.  There is no need for concern and it should clear up in a day or so.  SYMPTOMS TO REPORT IMMEDIATELY:  Following lower endoscopy (colonoscopy or flexible sigmoidoscopy):  Excessive amounts of blood in the stool  Significant tenderness or worsening of abdominal pains  Swelling of the abdomen that is new, acute  Fever of 100F or higher.  For urgent or emergent issues, a gastroenterologist can be reached at any hour by calling [phone]. Do not use MyChart messaging for urgent concerns.    DIET:  We do recommend a small meal at first, but then you may proceed to your regular diet.  Drink plenty of fluids but you should avoid  alcoholic beverages for 24 hours.  ACTIVITY:  You should plan to take it easy for the rest of today and you should NOT DRIVE or use heavy machinery until tomorrow (because of the sedation medicines used during the test).    FOLLOW UP: Our staff will call the number listed on your records the next business day following your procedure.  We will call around 7:15- 8:00 am to check on you and address any questions or concerns that you may have regarding the information given to you following your procedure. If we do not reach you, we will leave a message.     If any biopsies were taken you will be contacted by phone or by letter within the next 1-3 weeks.  Please call us at [phone] if you have not heard about the biopsies in 3 weeks.    SIGNATURES/CONFIDENTIALITY: You and/or your care partner have signed paperwork which will be entered into your electronic medical record.  These signatures attest to the fact that that the information above on your After Visit Summary has been reviewed and is understood.  Full responsibility of the confidentiality of this discharge information lies with you and/or your care-partner.

## 2023-03-26 NOTE — Progress Notes (Signed)
 GASTROENTEROLOGY PROCEDURE H&P NOTE   Primary Care Physician: de [place], Buren Kos, MD    Reason for Procedure:  FIT+, Colon Cancer screening  Plan:    Colonoscopy  Patient is appropriate for endoscopic procedure(s) in the ambulatory ([facility]) setting.  The nature of the procedure, as well as the risks, benefits, and alternatives were carefully and thoroughly reviewed with the patient. Ample time for discussion and questions allowed. The patient understood, was satisfied, and agreed to proceed.     HPI: Kelsey Simmons is a 63 y.o. female who presents for colonoscopy for continued Colon Cancer screening.  Recent FIT+.  No known family history of colon cancer or related malignancy.    Last colonoscopy in Jan 2014 at [facility]. Per patient normal.    Past Medical History:  Diagnosis Date   Allergy    Anxiety    Arthritis    Bilateral sacroiliitis (HCC)    Chronic back pain    Depression 2020   in therapy but not on medications   Diabetes mellitus without complication (HCC)    type 2    Fibromyalgia    Headache(784.0)    HX MIGRAINES   History of kidney stones 2020   left parenchymal stone identified on CT 11/23/18   kidney cyst   Hypercholesterolemia    Hypertension    Lumbar spondylosis    Lumbosacral neuritis    Myofascial pain    Paresthesia of lower extremity    Sleep apnea    HAS NOT USED C PAP SINCE 2007 DUE TO WT LOSS    Substance abuse (HCC)    Trochanteric bursitis    left hip    Past Surgical History:  Procedure Laterality Date   ABDOMINAL HYSTERECTOMY  01/22/1995   BUNIONECTOMY  01/21/2010   CESAREAN SECTION     COLONOSCOPY     GASTRIC BYPASS  01/22/2004   JOINT REPLACEMENT     RTKA 7'15   OVARY SURGERY     ROBOTIC ASSISTED LAPAROSCOPIC LYSIS OF ADHESION N/A 04/19/2019   Procedure: XI ROBOTIC ASSISTED LAPAROSCOPIC LYSIS OF ADHESION;  Surgeon: Carver Fila, MD;  Location: [facility];  Service: Gynecology;  Laterality: N/A;   ROBOTIC  ASSISTED SALPINGO OOPHERECTOMY Left 04/19/2019   Procedure: XI ROBOTIC ASSISTED SALPINGO OOPHORECTOMY;  Surgeon: Carver Fila, MD;  Location: [facility];  Service: Gynecology;  Laterality: Left;   TOTAL KNEE ARTHROPLASTY Right 08/18/2013   Procedure: RIGHT TOTAL KNEE ARTHROPLASTY;  Surgeon: Jacki Cones, MD;  Location: [facility];  Service: Orthopedics;  Laterality: Right;   TOTAL KNEE ARTHROPLASTY Left 04/13/2014   Procedure: LEFT TOTAL KNEE ARTHROPLASTY;  Surgeon: Ranee Gosselin, MD;  Location: [facility];  Service: Orthopedics;  Laterality: Left;   VENTRAL HERNIA REPAIR N/A 01/11/2019   Procedure: LAPAROSCOPIC VENTRAL HERNIA REPAIR WITH MESH;  Surgeon: Diamantina Monks, MD;  Location: [facility];  Service: General;  Laterality: N/A;    Prior to Admission medications   Medication Sig Start Date End Date Taking? Authorizing Provider  albuterol (VENTOLIN HFA) 108 (90 Base) MCG/ACT inhaler Inhale 2 puffs into the lungs every 6 (six) hours as needed for wheezing or shortness of breath. 10/30/22  Yes Luciano Cutter, MD  amLODipine (NORVASC) 5 MG tablet Take 0.5 tablets (2.5 mg total) by mouth daily. 02/26/23  Yes Caudle, Shelton Silvas, FNP  atorvastatin (LIPITOR) 20 MG tablet Take 1 tablet (20 mg total) by mouth daily. 02/26/23  Yes Caudle, Shelton Silvas, FNP  Blood Glucose Monitoring  Suppl DEVI 1 each by Does not apply route in the morning, at noon, and at bedtime. One Touch Verio Flex per patients insurance 09/25/22  Yes de [place], Buren Kos, MD  buPROPion (WELLBUTRIN XL) 300 MG 24 hr tablet Take 1 tablet (300 mg total) by mouth every morning. 03/03/23 06/01/23 Yes Arfeen, Phillips Grout, MD  gabapentin (NEURONTIN) 600 MG tablet Take 1 tablet (600 mg total) by mouth 3 (three) times daily. 02/26/23  Yes Caudle, Shelton Silvas, FNP  Glucose Blood (BLOOD GLUCOSE TEST STRIPS) STRP 1 each by In Vitro route in the morning, at noon, and at bedtime. May substitute to any manufacturer covered by patient's insurance. 09/12/22  Yes de  Peru, Raymond J, MD  lamoTRIgine (LAMICTAL) 100 MG tablet Take 1 tablet (100 mg total) by mouth daily. 03/06/23  Yes Arfeen, Phillips Grout, MD  Lancets [facility] DELICA PLUS LANCET33G) MISC USE 1 LANCET IN THE [place], AT NOON AND AT BEDTIME 01/07/23  Yes de [place], Buren Kos, MD  methocarbamol (ROBAXIN-750) 750 MG tablet Take 1 tablet (750 mg total) by mouth every 8 (eight) hours as needed for muscle spasms. 09/04/22  Yes Elijah Birk C, DO  SUMAtriptan (IMITREX) 100 MG tablet Take 1 tablet (100 mg total) by mouth every 2 (two) hours as needed for migraine or headache. May repeat in 2 hours if headache persists or recurs. 04/12/21  Yes Janeece Agee, NP  traMADol (ULTRAM) 50 MG tablet Take 1 tablet (50 mg total) by mouth every 8 (eight) hours as needed. 12/09/22  Yes Jacalyn Lefevre L, NP  ipratropium (ATROVENT) 0.03 % nasal spray Place 2 sprays into both nostrils 2 (two) times daily. 10/30/22   Luciano Cutter, MD  tirzepatide [facility]) 5 MG/0.5ML Pen Inject 5 mg into the skin once a week. 02/27/23   Hilbert Bible, FNP    Current Outpatient Medications  Medication Sig Dispense Refill   albuterol (VENTOLIN HFA) 108 (90 Base) MCG/ACT inhaler Inhale 2 puffs into the lungs every 6 (six) hours as needed for wheezing or shortness of breath. 80.4 g 1   amLODipine (NORVASC) 5 MG tablet Take 0.5 tablets (2.5 mg total) by mouth daily. 90 tablet 3   atorvastatin (LIPITOR) 20 MG tablet Take 1 tablet (20 mg total) by mouth daily. 90 tablet 3   Blood Glucose Monitoring Suppl DEVI 1 each by Does not apply route in the morning, at noon, and at bedtime. One Touch Verio Flex per patients insurance 1 each 0   buPROPion (WELLBUTRIN XL) 300 MG 24 hr tablet Take 1 tablet (300 mg total) by mouth every morning. 30 tablet 2   gabapentin (NEURONTIN) 600 MG tablet Take 1 tablet (600 mg total) by mouth 3 (three) times daily. 270 tablet 3   Glucose Blood (BLOOD GLUCOSE TEST STRIPS) STRP 1 each by In Vitro route in the  morning, at noon, and at bedtime. May substitute to any manufacturer covered by patient's insurance. 100 each 2   lamoTRIgine (LAMICTAL) 100 MG tablet Take 1 tablet (100 mg total) by mouth daily. 90 tablet 0   Lancets (ONETOUCH DELICA PLUS LANCET33G) MISC USE 1 LANCET IN THE MORNING, AT NOON AND AT BEDTIME 100 each 10   methocarbamol (ROBAXIN-750) 750 MG tablet Take 1 tablet (750 mg total) by mouth every 8 (eight) hours as needed for muscle spasms. 45 tablet 0   SUMAtriptan (IMITREX) 100 MG tablet Take 1 tablet (100 mg total) by mouth every 2 (two) hours as needed for migraine or headache.  May repeat in 2 hours if headache persists or recurs. 10 tablet 5   traMADol (ULTRAM) 50 MG tablet Take 1 tablet (50 mg total) by mouth every 8 (eight) hours as needed. 90 tablet 5   ipratropium (ATROVENT) 0.03 % nasal spray Place 2 sprays into both nostrils 2 (two) times daily. 60 mL 1   tirzepatide (MOUNJARO) 5 MG/0.5ML Pen Inject 5 mg into the skin once a week. 6 mL 3   Current Facility-Administered Medications  Medication Dose Route Frequency Provider Last Rate Last Admin   0.9 %  sodium chloride infusion  500 mL Intravenous Once Analucia Hush V, DO        Allergies as of 03/26/2023 - Review Complete 03/26/2023  Allergen Reaction Noted   Topiramate Other (See Comments) 11/24/2018   Chlorhexidine gluconate Rash 02/09/2019   Tape Rash 02/09/2019    Family History  Problem Relation Age of Onset   Diabetes Mellitus II Mother    Lupus Mother    Arthritis Mother    Diabetes Mother    Hypertension Mother    Miscarriages / [place] Mother    Lung cancer Father        smoker/worked in factory   Alcohol abuse Father    Cancer Father        smoker, worked in Marine scientist   Early death Father    Hypertension Father    Diabetes Mellitus II Sister    Arthritis Sister    Diabetes Sister    Hypertension Sister    Obesity Sister    Diabetes Mellitus II Brother    Hypercholesterolemia Brother     Diabetes Brother    Cancer Maternal Grandmother        Either uterine or endometrial   Colon cancer Neg Hx    Esophageal cancer Neg Hx    Liver disease Neg Hx    Stomach cancer Neg Hx    Rectal cancer Neg Hx     Social History   Socioeconomic History   Marital status: Married    Spouse name: Lucious   Number of children: 2   Years of education: Not on file   Highest education level: Some college, no degree  Occupational History   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  Tobacco Use   Smoking status: Never   Smokeless tobacco: Never  Vaping Use   Vaping status: Never Used  Substance and Sexual Activity   Alcohol use: Not Currently    Comment: quit 1994   Drug use: Not Currently    Types: "Crack" cocaine    Comment: quit 1994   Sexual activity: Not Currently    Birth control/protection: Post-menopausal  Other Topics Concern   Not on file  Social History Narrative   Lives with husband   Caffeine- 11 oz c daily   Social Drivers of Health   Financial Resource Strain: Low Risk  (01/21/2023)   Overall Financial Resource Strain (CARDIA)    Difficulty of Paying Living Expenses: Not hard at all  Food Insecurity: No Food Insecurity (01/21/2023)   Hunger Vital Sign    Worried About Running Out of Food in the Last Year: Never true    Ran Out of Food in the Last Year: Never true  Transportation Needs: No Transportation Needs (01/21/2023)   PRAPARE - Administrator, Civil Service (Medical): No    Lack of Transportation (Non-Medical): No  Physical Activity: Unknown (01/21/2023)   Exercise Vital Sign    Days of Exercise per Week:  0 days    Minutes of Exercise per Session: Not on file  Recent Concern: Physical Activity - Medium Risk (01/11/2023)   [facility]   Physical Activity    Moderate Physical Activity: Not on file    Vigorous Physical Activity: Never    Time Spent Sitting: More than 8 hours  Stress: Stress Concern Present (01/21/2023)   [employer] of Occupational Health - Occupational Stress Questionnaire    Feeling of Stress : To some extent  Social Connections: Moderately Isolated (01/21/2023)   Social Connection and Isolation Panel [NHANES]    Frequency of Communication with Friends and Family: More than three times a week    Frequency of Social Gatherings with Friends and Family: More than three times a week    Attends Religious Services: Never    Database administrator or Organizations: No    Attends Engineer, structural: Not on file    Marital Status: Married  Catering manager Violence: Not At Risk (02/26/2023)   Humiliation, Afraid, Rape, and Kick questionnaire    Fear of Current or Ex-Partner: No    Emotionally Abused: No    Physically Abused: No    Sexually Abused: No    Physical Exam: Vital signs in last 24 hours: @BP  (!) 143/82   Pulse 71   Temp 97.6 F (36.4 C)   Resp 14   Ht 5\' 1"  (1.549 m)   Wt 208 lb (94.3 kg)   SpO2 100%   BMI 39.30 kg/m  GEN: NAD EYE: Sclerae anicteric ENT: MMM CV: Non-tachycardic Pulm: CTA b/l GI: Soft, NT/ND NEURO:  Alert & Oriented x 3   Doristine Locks, DO [facility]   03/26/2023 2:12 PM

## 2023-03-26 NOTE — Progress Notes (Signed)
 1358- blood sugar-77

## 2023-03-26 NOTE — Progress Notes (Signed)
 Report given to PACU, vss

## 2023-03-27 ENCOUNTER — Telehealth: Payer: Self-pay

## 2023-03-27 NOTE — Telephone Encounter (Signed)
  Follow up Call-     03/26/2023    1:42 PM  Call back number  Post procedure Call Back phone  # [phone]  Permission to leave phone message Yes     Patient questions:  Do you have a fever, pain , or abdominal swelling? No. Pain Score  0 *  Have you tolerated food without any problems? Yes.    Have you been able to return to your normal activities? Yes.    Do you have any questions about your discharge instructions: Diet   No. Medications  No. Follow up visit  No.  Do you have questions or concerns about your Care? No.  Actions: * If pain score is 4 or above: No action needed, pain <4.

## 2023-03-29 ENCOUNTER — Other Ambulatory Visit (HOSPITAL_BASED_OUTPATIENT_CLINIC_OR_DEPARTMENT_OTHER): Payer: Self-pay | Admitting: Family Medicine

## 2023-03-29 DIAGNOSIS — G43109 Migraine with aura, not intractable, without status migrainosus: Secondary | ICD-10-CM

## 2023-04-09 ENCOUNTER — Ambulatory Visit (HOSPITAL_BASED_OUTPATIENT_CLINIC_OR_DEPARTMENT_OTHER): Payer: Managed Care, Other (non HMO) | Admitting: Family Medicine

## 2023-04-14 ENCOUNTER — Ambulatory Visit (HOSPITAL_BASED_OUTPATIENT_CLINIC_OR_DEPARTMENT_OTHER): Admitting: Family Medicine

## 2023-04-14 ENCOUNTER — Ambulatory Visit (HOSPITAL_BASED_OUTPATIENT_CLINIC_OR_DEPARTMENT_OTHER)

## 2023-04-14 ENCOUNTER — Encounter (HOSPITAL_BASED_OUTPATIENT_CLINIC_OR_DEPARTMENT_OTHER): Payer: Self-pay | Admitting: Family Medicine

## 2023-04-14 VITALS — BP 138/83 | HR 77 | Ht 61.0 in | Wt 203.8 lb

## 2023-04-14 DIAGNOSIS — G43109 Migraine with aura, not intractable, without status migrainosus: Secondary | ICD-10-CM

## 2023-04-14 DIAGNOSIS — I10 Essential (primary) hypertension: Secondary | ICD-10-CM

## 2023-04-14 DIAGNOSIS — R053 Chronic cough: Secondary | ICD-10-CM | POA: Diagnosis not present

## 2023-04-14 MED ORDER — ATOGEPANT 60 MG PO TABS: 1.0000 | ORAL_TABLET | Freq: Every day | ORAL | 2 refills | Status: DC

## 2023-04-14 NOTE — Progress Notes (Unsigned)
    Procedures performed today:    None.  Independent interpretation of notes and tests performed by another provider:   None.  Brief History, Exam, Impression, and Recommendations:    BP 138/83 (BP Location: Right Arm, Patient Position: Sitting, Cuff Size: Normal)   Pulse 77   Ht 5\' 1"  (1.549 m)   Wt 203 lb 12.8 oz (92.4 kg)   SpO2 100%   BMI 38.51 kg/m   Chronic cough Assessment & Plan: Patient has had ongoing cough.  This has been a chronic intermittent issue for patient.  Current issue with cough began following recent illness a few months ago.  She continues to have cough being intermittently productive.  Notes mild shortness of breath at times with coughing episodes.  However, no significant trouble breathing or shortness of breath otherwise. On exam, patient is in no acute distress, vital signs stable.  Cardiovascular Sam with regular rate and rhythm.  Lungs clear to auscultation bilaterally.  No increased work of breathing in office today. Discussed considerations.  Given persistent symptoms following recent illness, can proceed with chest x-ray today.  Ultimately, recommend that she also follow-up with her pulmonologist  Orders: -     DG Chest 2 View; Future  Migraine with aura and without status migrainosus, not intractable Assessment & Plan: Patient was started on trial of Qulipta with Alexis at last visit.  She unfortunately was not able to continue with medication for long enough time to determine impact of medication.  She would like to have prescription in order to continue with medication and monitor progress, feel that this is reasonable.  Prescription sent to pharmacy.  She has been cautioned on using this medication with other medications and avoiding use of it with sumatriptan and Excedrin  Orders: -     Atogepant; Take 1 tablet (60 mg total) by mouth daily.  Dispense: 30 tablet; Refill: 2  Primary hypertension Assessment & Plan: Blood pressure borderline in  office today.  At prior office visit, dose of amlodipine was adjusted due to blood pressure readings.  Discussed options, at this time, we will continue with current medication regimen and plan for close monitoring.  Recommend intermittent monitoring at home, DASH diet   Return in about 3 months (around 07/15/2023).   ___________________________________________ Hyun Marsalis de [place], MD, ABFM, CAQSM Primary Care and Sports Medicine [facility]

## 2023-04-14 NOTE — Patient Instructions (Signed)
  Medication Instructions:  Your physician recommends that you continue on your current medications as directed. Please refer to the Current Medication list given to you today. --If you need a refill on any your medications before your next appointment, please call your pharmacy first. If no refills are authorized on file call the office.--  Referrals/Procedures/Imaging: Chest xray Schedule with pulmonary  Follow-Up: Your next appointment:   Your physician recommends that you schedule a follow-up appointment in: 2-3 month follow up  with Dr. de Peru  You will receive a text message or e-mail with a link to a survey about your care and experience with [place] today! We would greatly appreciate your feedback!   Thanks for letting us be apart of your health journey!!  Primary Care and Sports Medicine   Dr. Ceasar Mons Peru   We encourage you to activate your patient portal called "MyChart".  Sign up information is provided on this After Visit Summary.  MyChart is used to connect with patients for Virtual Visits (Telemedicine).  Patients are able to view lab/test results, encounter notes, upcoming appointments, etc.  Non-urgent messages can be sent to your provider as well. To learn more about what you can do with MyChart, please visit --  [email or web address].

## 2023-04-18 NOTE — Assessment & Plan Note (Addendum)
 Blood pressure borderline in office today.  At prior office visit, dose of amlodipine was adjusted due to blood pressure readings.  Discussed options, at this time, we will continue with current medication regimen and plan for close monitoring.  Recommend intermittent monitoring at home, DASH diet

## 2023-04-18 NOTE — Assessment & Plan Note (Signed)
 Patient has had ongoing cough.  This has been a chronic intermittent issue for patient.  Current issue with cough began following recent illness a few months ago.  She continues to have cough being intermittently productive.  Notes mild shortness of breath at times with coughing episodes.  However, no significant trouble breathing or shortness of breath otherwise. On exam, patient is in no acute distress, vital signs stable.  Cardiovascular Sam with regular rate and rhythm.  Lungs clear to auscultation bilaterally.  No increased work of breathing in office today. Discussed considerations.  Given persistent symptoms following recent illness, can proceed with chest x-ray today.  Ultimately, recommend that she also follow-up with her pulmonologist

## 2023-04-18 NOTE — Assessment & Plan Note (Addendum)
 Patient was started on trial of Qulipta with Alexis at last visit.  She unfortunately was not able to continue with medication for long enough time to determine impact of medication.  She would like to have prescription in order to continue with medication and monitor progress, feel that this is reasonable.  Prescription sent to pharmacy.  She has been cautioned on using this medication with other medications and avoiding use of it with sumatriptan and Excedrin

## 2023-04-19 ENCOUNTER — Encounter (HOSPITAL_BASED_OUTPATIENT_CLINIC_OR_DEPARTMENT_OTHER): Payer: Self-pay | Admitting: Family Medicine

## 2023-04-29 ENCOUNTER — Encounter (HOSPITAL_BASED_OUTPATIENT_CLINIC_OR_DEPARTMENT_OTHER): Payer: Self-pay | Admitting: Family Medicine

## 2023-04-29 DIAGNOSIS — R053 Chronic cough: Secondary | ICD-10-CM

## 2023-05-02 NOTE — Telephone Encounter (Signed)
 Dr. De [place], Please see mychart message sent by pt and advise.

## 2023-05-20 ENCOUNTER — Telehealth: Payer: Self-pay | Admitting: Physical Medicine & Rehabilitation

## 2023-05-20 NOTE — Telephone Encounter (Signed)
 Pt called and wanted to get scheduled for a back injection with kirsteins. She stated is this okay to get scheduled before she see's Dorn Gaskins?

## 2023-05-27 ENCOUNTER — Encounter (HOSPITAL_COMMUNITY): Payer: Self-pay | Admitting: Psychiatry

## 2023-05-27 ENCOUNTER — Telehealth (HOSPITAL_BASED_OUTPATIENT_CLINIC_OR_DEPARTMENT_OTHER): Payer: Self-pay | Admitting: Psychiatry

## 2023-05-27 ENCOUNTER — Other Ambulatory Visit (HOSPITAL_COMMUNITY): Payer: Self-pay | Admitting: Psychiatry

## 2023-05-27 VITALS — Wt 199.0 lb

## 2023-05-27 DIAGNOSIS — F330 Major depressive disorder, recurrent, mild: Secondary | ICD-10-CM

## 2023-05-27 DIAGNOSIS — F411 Generalized anxiety disorder: Secondary | ICD-10-CM | POA: Diagnosis not present

## 2023-05-27 MED ORDER — LAMOTRIGINE 100 MG PO TABS: 100.0000 mg | ORAL_TABLET | Freq: Every day | ORAL | 0 refills | Status: DC

## 2023-05-27 MED ORDER — BUPROPION HCL ER (XL) 300 MG PO TB24: 300.0000 mg | ORAL_TABLET | ORAL | 2 refills | Status: DC

## 2023-05-27 NOTE — Progress Notes (Signed)
 [facility] MD Virtual Progress Note   Patient Location: Home Provider Location: Home Office  I connect with patient by video and verified that I am speaking with correct person by using two identifiers. I discussed the limitations of evaluation and management by telemedicine and the availability of in person appointments. I also discussed with the patient that there may be a patient responsible charge related to this service. The patient expressed understanding and agreed to proceed.  Kelsey Simmons [id number] 63 y.o.  05/27/2023 10:12 AM  History of Present Illness:  Patient is evaluated by video session.  She is taking Lamictal  and Wellbutrin  and reported symptoms are stable.  She is more upbeat and moving around and able to do chores.  She does use rolleraid when she go outside because of her back pain.  She is happy that her daughter is moving and living with her after she will sell her house.  Patient told her 39 year old daughter has autoimmune disease and require a lot of help as she is not able to take care of herself.  Patient also excited about upcoming trip to [place] in January with her sister.  Patient is very thrilled when her sister ask to go with her and she will pay for the trip.  Patient told job is sometimes challenging and busy but she is happy that she was able to get time off in September as mother's birthday and that in January when she go to [place].  She is sleeping good.  She denies any irritability, anger, crying spells or any feeling of hopelessness or worthlessness.  She is taking all the medication and able to lost few pounds and now she is under 200 and she is happy about it.  She denies any suicidal thoughts and panic attack.  She is sleeping good.  She would like to keep the current medication.    Past Psychiatric History: H/O drug use and brief psychiatric inpatient in mid 73s for passive suicidal thoughts.  No history of attempt.  H/O twice  rehab program.  H/O marijuana, crack and cocaine use.  Tried Paxil at that time but never consistent with treatment and follow-up.  PCP tried Cymbalta  up to 120 mg but caused diplopia. Never seen psychiatrist before. No h/o psychosis, mania.    Outpatient Encounter Medications as of 05/27/2023  Medication Sig   albuterol  (VENTOLIN  HFA) 108 (90 Base) MCG/ACT inhaler Inhale 2 puffs into the lungs every 6 (six) hours as needed for wheezing or shortness of breath.   amLODipine  (NORVASC ) 5 MG tablet Take 0.5 tablets (2.5 mg total) by mouth daily.   Atogepant  60 MG TABS Take 1 tablet (60 mg total) by mouth daily.   atorvastatin  (LIPITOR) 20 MG tablet Take 1 tablet (20 mg total) by mouth daily.   Blood Glucose Monitoring Suppl DEVI 1 each by Does not apply route in the morning, at noon, and at bedtime. One Touch Verio Flex per patients insurance   buPROPion  (WELLBUTRIN  XL) 300 MG 24 hr tablet Take 1 tablet (300 mg total) by mouth every morning.   gabapentin  (NEURONTIN ) 600 MG tablet Take 1 tablet (600 mg total) by mouth 3 (three) times daily.   Glucose Blood (BLOOD GLUCOSE TEST STRIPS) STRP 1 each by In Vitro route in the morning, at noon, and at bedtime. May substitute to any manufacturer covered by patient's insurance.   ipratropium (ATROVENT ) 0.03 % nasal spray Place 2 sprays into both nostrils 2 (two) times daily.   lamoTRIgine  (  LAMICTAL ) 100 MG tablet Take 1 tablet (100 mg total) by mouth daily.   Lancets (ONETOUCH DELICA PLUS LANCET33G) MISC USE 1 LANCET IN THE MORNING, AT NOON AND AT BEDTIME   methocarbamol  (ROBAXIN -750) 750 MG tablet Take 1 tablet (750 mg total) by mouth every 8 (eight) hours as needed for muscle spasms.   tirzepatide  (MOUNJARO ) 5 MG/0.5ML Pen Inject 5 mg into the skin once a week.   traMADol  (ULTRAM ) 50 MG tablet Take 1 tablet (50 mg total) by mouth every 8 (eight) hours as needed.   Facility-Administered Encounter Medications as of 05/27/2023  Medication   0.9 %  sodium chloride   infusion    Recent Results (from the past 2160 hours)  Lipid panel     Status: None   Collection Time: 02/26/23  9:16 AM  Result Value Ref Range   Cholesterol, Total 182 100 - 199 mg/dL   Triglycerides 67 0 - 149 mg/dL   HDL 98 >16 mg/dL   VLDL Cholesterol Cal 13 5 - 40 mg/dL   LDL Chol Calc (NIH) 71 0 - 99 mg/dL   Chol/HDL Ratio 1.9 0.0 - 4.4 ratio    Comment:                                   T. Chol/HDL Ratio                                             Men  Women                               1/2 Avg.Risk  3.4    3.3                                   Avg.Risk  5.0    4.4                                2X Avg.Risk  9.6    7.1                                3X Avg.Risk 23.4   11.0   Hemoglobin A1c     Status: Abnormal   Collection Time: 02/26/23  9:16 AM  Result Value Ref Range   Hgb A1c MFr Bld 5.8 (H) 4.8 - 5.6 %    Comment:          Prediabetes: 5.7 - 6.4          Diabetes: >6.4          Glycemic control for adults with diabetes: <7.0    Est. average glucose Bld gHb Est-mCnc 120 mg/dL     Psychiatric Specialty Exam: Physical Exam  Review of Systems  Musculoskeletal:  Positive for back pain.    Weight 199 lb (90.3 kg).There is no height or weight on file to calculate BMI.  General Appearance: Casual  Eye Contact:  Good  Speech:  Clear and Coherent and Normal Rate  Volume:  Normal  Mood:  Euthymic  Affect:  Appropriate  Thought  Process:  Goal Directed  Orientation:  Full (Time, Place, and Person)  Thought Content:  Logical  Suicidal Thoughts:  No  Homicidal Thoughts:  No  Memory:  Immediate;   Good Recent;   Good Remote;   Good  Judgement:  Good  Insight:  Good  Psychomotor Activity:  Normal  Concentration:  Concentration: Good and Attention Span: Good  Recall:  Good  Fund of Knowledge:  Good  Language:  Good  Akathisia:  No  Handed:  Right  AIMS (if indicated):     Assets:  Communication Skills Desire for Improvement Housing Resilience Social  Support Talents/Skills Transportation  ADL's:  Intact  Cognition:  WNL  Sleep:  ok       04/14/2023    2:21 PM 02/26/2023    8:31 AM 12/09/2022    3:14 PM 11/13/2022    2:34 PM 09/12/2022    3:19 PM  Depression screen PHQ 2/9  Decreased Interest 0 1 0 3 1  Down, Depressed, Hopeless 1 1 0 3 1  PHQ - 2 Score 1 2 0 6 2  Altered sleeping 2 1   1   Tired, decreased energy 1 1   1   Change in appetite 2 1   0  Feeling bad or failure about yourself  1 2   0  Trouble concentrating 1 0   1  Moving slowly or fidgety/restless 3 0   0  Suicidal thoughts 0 0   0  PHQ-9 Score 11 7   5   Difficult doing work/chores Somewhat difficult Somewhat difficult   Somewhat difficult    Assessment/Plan: MDD (major depressive disorder), recurrent episode, mild (HCC) - Plan: buPROPion  (WELLBUTRIN  XL) 300 MG 24 hr tablet, lamoTRIgine  (LAMICTAL ) 100 MG tablet  GAD (generalized anxiety disorder) - Plan: buPROPion  (WELLBUTRIN  XL) 300 MG 24 hr tablet, lamoTRIgine  (LAMICTAL ) 100 MG tablet  Patient is stable on Wellbutrin  and Lamictal .  She has no rash or any itching.  She is more active and upbeat.  Discussed medication side effects and benefits.  She is in therapy with GYN.  Recommend to call us  back if she has any question or any concern.  Follow-up in 3 months   Follow Up Instructions:     I discussed the assessment and treatment plan with the patient. The patient was provided an opportunity to ask questions and all were answered. The patient agreed with the plan and demonstrated an understanding of the instructions.   The patient was advised to call back or seek an in-person evaluation if the symptoms worsen or if the condition fails to improve as anticipated.    Collaboration of Care: Other provider involved in patient's care AEB notes are available in epic to review  Patient/Guardian was advised Release of Information must be obtained prior to any record release in order to collaborate their care with  an outside provider. Patient/Guardian was advised if they have not already done so to contact the registration department to sign all necessary forms in order for us  to release information regarding their care.   Consent: Patient/Guardian gives verbal consent for treatment and assignment of benefits for services provided during this visit. Patient/Guardian expressed understanding and agreed to proceed.     Total encounter time 18 minutes which includes face-to-face time, chart reviewed, care coordination, order entry and documentation during this encounter.   Note: This document was prepared by [employer] and any errors that results from this process are unintentional.    Henrine Logan  Inda Manchester, MD 05/27/2023

## 2023-05-28 ENCOUNTER — Encounter (HOSPITAL_BASED_OUTPATIENT_CLINIC_OR_DEPARTMENT_OTHER): Payer: Self-pay | Admitting: Family Medicine

## 2023-05-28 ENCOUNTER — Ambulatory Visit (HOSPITAL_BASED_OUTPATIENT_CLINIC_OR_DEPARTMENT_OTHER): Admitting: Family Medicine

## 2023-05-28 VITALS — BP 121/77 | HR 104 | Ht 61.0 in | Wt 197.8 lb

## 2023-05-28 DIAGNOSIS — Z124 Encounter for screening for malignant neoplasm of cervix: Secondary | ICD-10-CM | POA: Diagnosis not present

## 2023-05-28 DIAGNOSIS — E119 Type 2 diabetes mellitus without complications: Secondary | ICD-10-CM

## 2023-05-28 DIAGNOSIS — I10 Essential (primary) hypertension: Secondary | ICD-10-CM

## 2023-05-28 DIAGNOSIS — G43109 Migraine with aura, not intractable, without status migrainosus: Secondary | ICD-10-CM

## 2023-05-28 DIAGNOSIS — Z7985 Long-term (current) use of injectable non-insulin antidiabetic drugs: Secondary | ICD-10-CM

## 2023-05-28 LAB — POCT GLYCOSYLATED HEMOGLOBIN (HGB A1C)
HbA1c POC (<> result, manual entry): 5.7 % (ref 4.0–5.6)
HbA1c, POC (controlled diabetic range): 5.7 % (ref 0.0–7.0)
HbA1c, POC (prediabetic range): 5.7 % (ref 5.7–6.4)
Hemoglobin A1C: 5.7 % — AB (ref 4.0–5.6)

## 2023-05-28 NOTE — Progress Notes (Signed)
    Procedures performed today:    None.  Independent interpretation of notes and tests performed by another provider:   None.  Brief History, Exam, Impression, and Recommendations:    BP 121/77 (BP Location: Right Arm, Patient Position: Sitting, Cuff Size: Normal)   Pulse (!) 104   Ht 5\' 1"  (1.549 m)   Wt 197 lb 12.8 oz (89.7 kg)   SpO2 100%   BMI 37.37 kg/m   Diabetes mellitus without complication (HCC) Assessment & Plan: Recent hemoglobin A1c has been well-controlled.  She denies any new symptoms such as polyuria or polydipsia.  She continues with Mounjaro .  No reported GI issues with Mounjaro . Hemoglobin A1c has been controlled previously, still well-controlled today at 5.7%.  Can continue with current medication regimen, no changes today  Orders: -     POCT glycosylated hemoglobin (Hb A1C)  Cervical cancer screening -     Ambulatory referral to Obstetrics / Gynecology  Primary hypertension Assessment & Plan: Blood pressure controlled in office today.  She has been checking her blood pressure at home and reports that home readings have been controlled as well.  We will continue with current medication regimen and plan for close monitoring.  Recommend intermittent monitoring at home, DASH diet   Migraine with aura and without status migrainosus, not intractable Assessment & Plan: Since starting Qulipta , she reports that her headaches have notably improved.  Only side effects that she has noticed include appetite suppression as well as a degree of constipation.  She denies any significant abdominal pain associated with constipation. We discussed considerations and given degree to which she has noted improvement in headaches, she does feel that the trade off with mild constipation is still reasonable.  We did discuss general lifestyle modifications to assist with managing constipation.  We can continue to monitor progress moving forward and if any further changes need to be  made   Return in about 4 months (around 09/28/2023) for diabetes, med check, hypertension.   ___________________________________________ Kelsey Morneault de [place], MD, ABFM, CAQSM Primary Care and Sports Medicine [facility]

## 2023-05-28 NOTE — Assessment & Plan Note (Signed)
 Blood pressure controlled in office today.  She has been checking her blood pressure at home and reports that home readings have been controlled as well.  We will continue with current medication regimen and plan for close monitoring.  Recommend intermittent monitoring at home, DASH diet

## 2023-05-28 NOTE — Assessment & Plan Note (Signed)
 Since starting Qulipta , she reports that her headaches have notably improved.  Only side effects that she has noticed include appetite suppression as well as a degree of constipation.  She denies any significant abdominal pain associated with constipation. We discussed considerations and given degree to which she has noted improvement in headaches, she does feel that the trade off with mild constipation is still reasonable.  We did discuss general lifestyle modifications to assist with managing constipation.  We can continue to monitor progress moving forward and if any further changes need to be made

## 2023-05-28 NOTE — Patient Instructions (Signed)

## 2023-05-28 NOTE — Assessment & Plan Note (Signed)
 Recent hemoglobin A1c has been well-controlled.  She denies any new symptoms such as polyuria or polydipsia.  She continues with Mounjaro .  No reported GI issues with Mounjaro . Hemoglobin A1c has been controlled previously, still well-controlled today at 5.7%.  Can continue with current medication regimen, no changes today

## 2023-05-29 ENCOUNTER — Other Ambulatory Visit: Payer: Self-pay | Admitting: Registered Nurse

## 2023-05-29 DIAGNOSIS — M797 Fibromyalgia: Secondary | ICD-10-CM

## 2023-05-30 ENCOUNTER — Telehealth: Payer: Self-pay | Admitting: Registered Nurse

## 2023-05-30 DIAGNOSIS — M797 Fibromyalgia: Secondary | ICD-10-CM

## 2023-05-30 MED ORDER — TRAMADOL HCL 50 MG PO TABS
50.0000 mg | ORAL_TABLET | Freq: Three times a day (TID) | ORAL | 0 refills | Status: DC | PRN
Start: 2023-05-30 — End: 2023-06-18

## 2023-05-30 NOTE — Telephone Encounter (Signed)
 PMP was Reviewed.  Tramadol  prescription e-scribed to pharmacy.  Kelsey Simmons has a scheduled appointment with Dr Dorn Gaskins on 06/11/2023, no refills on prescription.

## 2023-06-06 NOTE — Progress Notes (Unsigned)
 Subjective:    Patient ID: Kelsey Simmons, female    DOB: 1960/07/20, 63 y.o.   MRN: [id number]  HPI  Kelsey Simmons is a 63 y.o. year old female  who  has a past medical history of Allergy, Anxiety, Arthritis, Bilateral sacroiliitis (HCC), Chronic back pain, Depression (2020), Diabetes mellitus without complication (HCC), Fibromyalgia, Headache(784.0), History of kidney stones (2020), Hypercholesterolemia, Hypertension, Lumbar spondylosis, Lumbosacral neuritis, Myofascial pain, Paresthesia of lower extremity, Sleep apnea, Substance abuse (HCC), and Trochanteric bursitis.   They are presenting to PM&R clinic for follow up related to chronic neck, low back pain w/ lumbosacral radiculopathy, and fibromylgia pain .  Plan from last visit: Cervicalgia: RX: Cervical X-ray. Continue current medication regimen and continue to monitor.  Impingement of Right Shoulder: S/P Right Shoulder Injection: 11/13/2022 with good relief noted.  Chronic Bilateral Lower Back Pain: Continue HEP as Tolerated. Continue to Monitor.  Chronic Pain of Right Knee: RX: Right Knee X-ry. Continue HEP as Tolerated. Continue to Monitor.  Fibromyalgia: Continue Gabapentin . Continue HEP as Tolerated. Continue to Monitor.  Chronic Pain Syndrome: Refilled: Tramadol  50 mg one tablet every 8 hours as needed for pain #90. We will continue the opioid monitoring program, this consists of regular clinic visits, examinations, urine drug screen, pill counts as well as use of [place]  Controlled Substance Reporting system. A 12 month History has been reviewed on the [place]  Controlled Substance Reporting System on 12/09/2022   F/U in 6 months    Interval Hx:  - Therapies: She is going to start back to walking her dog. She says since losing weight, she notices the pain takes much longer to kick in but still occurs after prolonged standing or walking.    - Follow ups: Dr Sharl Davies did bilateral S1neuroforaminal ESI injection  08/2022; patient interested in repeating. She says it lasted about 4 months when she last got it. She has had increased burning pain in her lateral hips/thighs recently/.    - Falls: She fell once last year, coming down her daughter's stairs. There are items stored on the steps and her right foot got stuck on a step, and she fell down. No injuries from that. She says since then, she can actually squat lower.    - DME: Has to use her rollator in the community for mobility because prolonged standing and walking hurts her back.    - Medications: Tramadol  still working well for pain control; no side effects, no concerns.    - Other concerns: She is playing video games with her grandchildren and enjoying spending time with them. She like Roblox and zombie games. She says they generally are in the top 3 players of their games.   Pain Inventory Average Pain 6 Pain Right Now 8 My pain is sharp, burning, and aching  In the last 24 hours, has pain interfered with the following? General activity 1 Relation with others 0 Enjoyment of life 0 What TIME of day is your pain at its worst? daytime Sleep (in general) Fair  Pain is worse with: walking, sitting, and standing Pain improves with: rest, heat/ice, medication, and injections Relief from Meds: Good  Family History  Problem Relation Age of Onset   Diabetes Mellitus II Mother    Lupus Mother    Arthritis Mother    Diabetes Mother    Hypertension Mother    Miscarriages / [place] Mother    Lung cancer Father        smoker/worked in factory  Alcohol abuse Father    Cancer Father        smoker, worked in Marine scientist   Early death Father    Hypertension Father    Diabetes Mellitus II Sister    Arthritis Sister    Diabetes Sister    Hypertension Sister    Obesity Sister    Diabetes Mellitus II Brother    Hypercholesterolemia Brother    Diabetes Brother    Cancer Maternal Grandmother        Either uterine or endometrial   Colon cancer  Neg Hx    Esophageal cancer Neg Hx    Liver disease Neg Hx    Stomach cancer Neg Hx    Rectal cancer Neg Hx    Social History   Socioeconomic History   Marital status: Married    Spouse name: Lucious   Number of children: 2   Years of education: Not on file   Highest education level: Some college, no degree  Occupational History   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  Tobacco Use   Smoking status: Never    Passive exposure: Never   Smokeless tobacco: Never  Vaping Use   Vaping status: Never Used  Substance and Sexual Activity   Alcohol use: Not Currently    Comment: quit 1994   Drug use: Not Currently    Types: "Crack" cocaine    Comment: quit 1994   Sexual activity: Not Currently    Birth control/protection: Post-menopausal  Other Topics Concern   Not on file  Social History Narrative   Lives with husband   Caffeine- 11 oz c daily   Social Drivers of Health   Financial Resource Strain: Low Risk  (01/21/2023)   Overall Financial Resource Strain (CARDIA)    Difficulty of Paying Living Expenses: Not hard at all  Food Insecurity: No Food Insecurity (01/21/2023)   Hunger Vital Sign    Worried About Running Out of Food in the Last Year: Never true    Ran Out of Food in the Last Year: Never true  Transportation Needs: No Transportation Needs (01/21/2023)   PRAPARE - Administrator, Civil Service (Medical): No    Lack of Transportation (Non-Medical): No  Physical Activity: Unknown (01/21/2023)   Exercise Vital Sign    Days of Exercise per Week: 0 days    Minutes of Exercise per Session: Not on file  Recent Concern: Physical Activity - Medium Risk (01/11/2023)   [facility]   Physical Activity    Moderate Physical Activity: Not on file    Vigorous Physical Activity: Never    Time Spent Sitting: More than 8 hours  Stress: Stress Concern Present (01/21/2023)   [employer] of Occupational Health - Occupational Stress Questionnaire     Feeling of Stress : To some extent  Social Connections: Moderately Isolated (01/21/2023)   Social Connection and Isolation Panel [NHANES]    Frequency of Communication with Friends and Family: More than three times a week    Frequency of Social Gatherings with Friends and Family: More than three times a week    Attends Religious Services: Never    Database administrator or Organizations: No    Attends Banker Meetings: Not on file    Marital Status: Married   Past Surgical History:  Procedure Laterality Date   ABDOMINAL HYSTERECTOMY  01/22/1995   BUNIONECTOMY  01/21/2010   CESAREAN SECTION     COLONOSCOPY     GASTRIC BYPASS  01/22/2004   JOINT REPLACEMENT     RTKA 7'15   OVARY SURGERY     ROBOTIC ASSISTED LAPAROSCOPIC LYSIS OF ADHESION N/A 04/19/2019   Procedure: XI ROBOTIC ASSISTED LAPAROSCOPIC LYSIS OF ADHESION;  Surgeon: Suzi Essex, MD;  Location: [facility];  Service: Gynecology;  Laterality: N/A;   ROBOTIC ASSISTED SALPINGO OOPHERECTOMY Left 04/19/2019   Procedure: XI ROBOTIC ASSISTED SALPINGO OOPHORECTOMY;  Surgeon: Suzi Essex, MD;  Location: [facility];  Service: Gynecology;  Laterality: Left;   TOTAL KNEE ARTHROPLASTY Right 08/18/2013   Procedure: RIGHT TOTAL KNEE ARTHROPLASTY;  Surgeon: Florencia Hunter, MD;  Location: [facility];  Service: Orthopedics;  Laterality: Right;   TOTAL KNEE ARTHROPLASTY Left 04/13/2014   Procedure: LEFT TOTAL KNEE ARTHROPLASTY;  Surgeon: Hazle Lites, MD;  Location: [facility];  Service: Orthopedics;  Laterality: Left;   VENTRAL HERNIA REPAIR N/A 01/11/2019   Procedure: LAPAROSCOPIC VENTRAL HERNIA REPAIR WITH MESH;  Surgeon: Anda Bamberg, MD;  Location: [facility];  Service: General;  Laterality: N/A;   Past Surgical History:  Procedure Laterality Date   ABDOMINAL HYSTERECTOMY  01/22/1995   BUNIONECTOMY  01/21/2010   CESAREAN SECTION     COLONOSCOPY     GASTRIC BYPASS  01/22/2004   JOINT REPLACEMENT     RTKA 7'15   OVARY  SURGERY     ROBOTIC ASSISTED LAPAROSCOPIC LYSIS OF ADHESION N/A 04/19/2019   Procedure: XI ROBOTIC ASSISTED LAPAROSCOPIC LYSIS OF ADHESION;  Surgeon: Suzi Essex, MD;  Location: [facility];  Service: Gynecology;  Laterality: N/A;   ROBOTIC ASSISTED SALPINGO OOPHERECTOMY Left 04/19/2019   Procedure: XI ROBOTIC ASSISTED SALPINGO OOPHORECTOMY;  Surgeon: Suzi Essex, MD;  Location: [facility];  Service: Gynecology;  Laterality: Left;   TOTAL KNEE ARTHROPLASTY Right 08/18/2013   Procedure: RIGHT TOTAL KNEE ARTHROPLASTY;  Surgeon: Florencia Hunter, MD;  Location: [facility];  Service: Orthopedics;  Laterality: Right;   TOTAL KNEE ARTHROPLASTY Left 04/13/2014   Procedure: LEFT TOTAL KNEE ARTHROPLASTY;  Surgeon: Hazle Lites, MD;  Location: [facility];  Service: Orthopedics;  Laterality: Left;   VENTRAL HERNIA REPAIR N/A 01/11/2019   Procedure: LAPAROSCOPIC VENTRAL HERNIA REPAIR WITH MESH;  Surgeon: Anda Bamberg, MD;  Location: [facility];  Service: General;  Laterality: N/A;   Past Medical History:  Diagnosis Date   Allergy    Anxiety    Arthritis    Bilateral sacroiliitis (HCC)    Chronic back pain    Depression 2020   in therapy but not on medications   Diabetes mellitus without complication (HCC)    type 2    Fibromyalgia    Headache(784.0)    HX MIGRAINES   History of kidney stones 2020   left parenchymal stone identified on CT 11/23/18   kidney cyst   Hypercholesterolemia    Hypertension    Lumbar spondylosis    Lumbosacral neuritis    Myofascial pain    Paresthesia of lower extremity    Sleep apnea    HAS NOT USED C PAP SINCE 2007 DUE TO WT LOSS    Substance abuse (HCC)    Trochanteric bursitis    left hip   There were no vitals taken for this visit.  Opioid Risk Score:   Fall Risk Score:  `1  Depression screen PHQ 2/9     05/28/2023    3:35 PM 04/14/2023    2:21 PM 02/26/2023    8:31 AM 12/09/2022    3:14 PM 11/13/2022  2:34 PM 09/12/2022    3:19 PM 09/04/2022     4:36 PM  Depression screen PHQ 2/9  Decreased Interest 0 0 1 0 3 1 3   Down, Depressed, Hopeless 0 1 1 0 3 1 3   PHQ - 2 Score 0 1 2 0 6 2 6   Altered sleeping 0 2 1   1    Tired, decreased energy 0 1 1   1    Change in appetite 0 2 1   0   Feeling bad or failure about yourself  0 1 2   0   Trouble concentrating 0 1 0   1   Moving slowly or fidgety/restless 0 3 0   0   Suicidal thoughts 0 0 0   0   PHQ-9 Score 0 11 7   5    Difficult doing work/chores Not difficult at all Somewhat difficult Somewhat difficult   Somewhat difficult     Review of Systems  Musculoskeletal:  Positive for back pain and neck pain.       Pain in both buttocks, both upper legs  All other systems reviewed and are negative.      Objective:   Physical Exam    PE: Constitution: Appropriate appearance for age. No apparent distress  +Obese Resp: No respiratory distress. No accessory muscle usage. on RA and CTAB Cardio: Well perfused appearance. Tr peripheral edema. Abdomen: Nondistended. Nontender.   Psych: Appropriate mood and affect. Neuro: AAOx4. No apparent cognitive deficits   Neurologic Exam:   DTRs: Reflexes were 2+ in bilateral achilles, patella, biceps, BR and triceps. Babinsky: flexor responses b/l.   Hoffmans: negative b/l Sensory exam: revealed normal sensation in all dermatomal regions in bilateral upper extremities, bilateral lower extremities, and with reduced sensation to light touch in bilateral lateral thighs Motor exam: strength 4-/5 proximally and 5-/5 distally throughout  Coordination: Fine motor coordination was normal.   Gait: Stable with RW, forward flexed, reduced stride   MSK: Back Exam:   Inspection: Pelvis was  even.  Lumbar lordotic curvature was increased.  There was  no evidence of scoliosis.  Palpation: Palpatory exam revealed ttp at the b/l PSIS, SI joints, GTBS, lateral thighs, and lumbar paraspinals . There was  no evidence of spasm.   Special/provocative testing:     Facet loading: - (very non-specific)   TTP at paraspinals: -(sensitive for facet pain...if no ttp then likely not facet pain)   Veldon German test: - L,. + R   FAIR test: - L, + R   Gaenslen test: - b/l   Yeoman's test: + R, -L   Information in () parenthesis is normals/details of specific exam.           Assessment & Plan:   Kelsey Simmons is a 63 y.o. year old female  who  has a past medical history of Allergy, Anxiety, Arthritis, Bilateral sacroiliitis (HCC), Chronic back pain, Depression (2020), Diabetes mellitus without complication (HCC), Fibromyalgia, Headache(784.0), History of kidney stones (2020), Hypercholesterolemia, Hypertension, Lumbar spondylosis, Lumbosacral neuritis, Myofascial pain, Paresthesia of lower extremity, Sleep apnea, Substance abuse (HCC), and Trochanteric bursitis.   They are presenting to PM&R clinic for follow up related to chronic neck, low back pain w/ lumbosacral radiculopathy, and fibromylgia pain .  Fibromyalgia Cervicalgia Encounter for therapeutic drug monitoring Encounter for long-term opiate analgesic use Patient is stable on current regimen of tramadol , will continue medication and refill for 6 months.  Follow-up in 6 months for chronic pain management with myself or  Emilia Harbour.  Come back in 2 weeks for trigger point injections into the neck and shoulders.  In the meantime, use topical Voltaren gel up to 4 times daily, alternating with heat and massage to help with muscle tension.  Chronic bilateral low back pain with sciatica, sciatica laterality unspecified Chronic right sacroiliac joint pain  On evaluation today, the pain in your low back and lateral thighs could certainly be from ongoing S1 neuropathy, but there is also some indication of gluteal/hamstring tendinitis and right sacroiliitis.   Since S1 injections with Dr. Sharl Davies worked well in the past, I will send you to him again for these.    If these injections are not as effective as last time,  let me know and we will get you started with PT for hip and low back strengthening (will look into who can see you after 5 pm due to work restrictions), and get you a right SI joint injection at least 6 weeks after your epidural steroid injection.

## 2023-06-11 ENCOUNTER — Encounter
Payer: Managed Care, Other (non HMO) | Attending: Physical Medicine and Rehabilitation | Admitting: Physical Medicine and Rehabilitation

## 2023-06-11 ENCOUNTER — Encounter: Payer: Self-pay | Admitting: Physical Medicine and Rehabilitation

## 2023-06-11 VITALS — BP 100/66 | HR 93 | Ht 61.0 in | Wt 199.0 lb

## 2023-06-11 DIAGNOSIS — M5442 Lumbago with sciatica, left side: Secondary | ICD-10-CM | POA: Diagnosis present

## 2023-06-11 DIAGNOSIS — M797 Fibromyalgia: Secondary | ICD-10-CM | POA: Diagnosis present

## 2023-06-11 DIAGNOSIS — Z5181 Encounter for therapeutic drug level monitoring: Secondary | ICD-10-CM

## 2023-06-11 DIAGNOSIS — Z79891 Long term (current) use of opiate analgesic: Secondary | ICD-10-CM | POA: Diagnosis present

## 2023-06-11 DIAGNOSIS — M533 Sacrococcygeal disorders, not elsewhere classified: Secondary | ICD-10-CM

## 2023-06-11 DIAGNOSIS — M542 Cervicalgia: Secondary | ICD-10-CM

## 2023-06-11 DIAGNOSIS — G8929 Other chronic pain: Secondary | ICD-10-CM

## 2023-06-11 DIAGNOSIS — M5441 Lumbago with sciatica, right side: Secondary | ICD-10-CM

## 2023-06-11 NOTE — Patient Instructions (Addendum)
 Patient is stable on current regimen of tramadol , will continue medication and refill for 6 months.  Follow-up in 6 months for chronic pain management with myself or Emilia Harbour.  On evaluation today, the pain in your low back and lateral thighs could certainly be from ongoing S1 neuropathy, but there is also some indication of gluteal/hamstring tendinitis and right sacroiliitis.  Since S1 injections with Dr. Sharl Davies worked well in the past, I will send you to him again for these.  If these injections are not as effective as last time, let me know and we will get you started with PT for hip and low back strengthening (will look into who can see you after 5 pm due to work restrictions), and get you a right SI joint injection at least 6 weeks after your epidural steroid injection.  Come back in 2 weeks for trigger point injections into the neck and shoulders.  In the meantime, use topical Voltaren gel up to 4 times daily, alternating with heat and massage to help with muscle tension.

## 2023-06-18 MED ORDER — TRAMADOL HCL 50 MG PO TABS: 50.0000 mg | ORAL_TABLET | Freq: Three times a day (TID) | ORAL | 5 refills | Status: DC | PRN

## 2023-06-20 ENCOUNTER — Other Ambulatory Visit (HOSPITAL_COMMUNITY): Payer: Self-pay | Admitting: Psychiatry

## 2023-06-20 DIAGNOSIS — F411 Generalized anxiety disorder: Secondary | ICD-10-CM

## 2023-06-20 DIAGNOSIS — F330 Major depressive disorder, recurrent, mild: Secondary | ICD-10-CM

## 2023-06-25 ENCOUNTER — Institutional Professional Consult (permissible substitution) (INDEPENDENT_AMBULATORY_CARE_PROVIDER_SITE_OTHER)

## 2023-07-04 ENCOUNTER — Other Ambulatory Visit: Payer: Self-pay | Admitting: Registered Nurse

## 2023-07-04 DIAGNOSIS — M797 Fibromyalgia: Secondary | ICD-10-CM

## 2023-07-14 ENCOUNTER — Institutional Professional Consult (permissible substitution) (INDEPENDENT_AMBULATORY_CARE_PROVIDER_SITE_OTHER): Admitting: Otolaryngology

## 2023-07-15 ENCOUNTER — Encounter (HOSPITAL_BASED_OUTPATIENT_CLINIC_OR_DEPARTMENT_OTHER): Admitting: Certified Nurse Midwife

## 2023-07-15 ENCOUNTER — Other Ambulatory Visit (HOSPITAL_BASED_OUTPATIENT_CLINIC_OR_DEPARTMENT_OTHER): Payer: Self-pay | Admitting: Family Medicine

## 2023-07-15 DIAGNOSIS — G43109 Migraine with aura, not intractable, without status migrainosus: Secondary | ICD-10-CM

## 2023-07-22 ENCOUNTER — Ambulatory Visit (INDEPENDENT_AMBULATORY_CARE_PROVIDER_SITE_OTHER): Admitting: Otolaryngology

## 2023-07-22 ENCOUNTER — Encounter (INDEPENDENT_AMBULATORY_CARE_PROVIDER_SITE_OTHER): Payer: Self-pay | Admitting: Otolaryngology

## 2023-07-22 VITALS — BP 114/74 | HR 99 | Ht 61.0 in | Wt 199.0 lb

## 2023-07-22 DIAGNOSIS — R0989 Other specified symptoms and signs involving the circulatory and respiratory systems: Secondary | ICD-10-CM | POA: Diagnosis not present

## 2023-07-22 DIAGNOSIS — R053 Chronic cough: Secondary | ICD-10-CM | POA: Diagnosis not present

## 2023-07-22 DIAGNOSIS — R0982 Postnasal drip: Secondary | ICD-10-CM | POA: Diagnosis not present

## 2023-07-22 DIAGNOSIS — J383 Other diseases of vocal cords: Secondary | ICD-10-CM

## 2023-07-22 MED ORDER — AZELASTINE HCL 0.1 % NA SOLN: 2.0000 | Freq: Two times a day (BID) | NASAL | 12 refills | Status: AC

## 2023-07-22 NOTE — Progress Notes (Signed)
 Dear Dr. de Peru, Here is my assessment for our mutual patient, Kelsey Simmons. Thank you for allowing me the opportunity to care for your patient. Please do not hesitate to contact me should you have any other questions. Sincerely, Dr. Eldora Blanch  Otolaryngology Clinic Note Referring provider: Dr. de Peru HPI:  Kelsey Simmons is a 63 y.o. female kindly referred by Dr. de Peru for evaluation of chronic cough and throat clearing  Initial visit (07/2023): Patient reports: she reports that she had a URI in early January which started her chronic cough; she reports that she frequently gets a post-viral cough which is dry, but this has substantially improved/essentially resolved. She also reports that she has chronic throat clearing, ongoing for months - does not remember what brings it about. Sometimes does get a tickle in throat (mostly at night). Nothing makes it better or worse. She has not tried anything for it except does use PO antihistamine and atrovent  which was started by pulm - helps some nasal dripping but otherwise not with throat clearing. She feels as if some of the PND triggers her throat clearing. No GERD sx - does not take anything for it. No frequent sinonasal symptoms except PND but does have some congestion/AR symptoms in summer. No smell or perfume triggers. She is on 600mg  Gabapentin  TID for back pain. No medication changes recently.  Water : 30 oz Patient otherwise denies: - dysphagia, odynophagia, aspiration episodes or PNA, need for Heimlich, unintentional weight loss - changes in voice, shortness of breath, hemoptysis - ear pain, neck masses  ENT Surgery: no Personal or FHx of bleeding dz or anesthesia difficulty: no  AP/AC: no  Tobacco: no. Alcohol: no  PMHx: GAD/MDD, DM, Headaches, Back pain, Chronic pain on tramadol , HTN, Migraines  Independent Review of Additional Tests or Records:  Dr. Penne (2025 and 08/12/2022): noted chronic throat clearing and cough, ongoing  since 2024. Tried allergy medications, no reflux sx; triggers with deep breath, reassuring CXR; Dx: throat clearin and cough; Rx: ref to ENT Dr. Kassie 10/29/2022 (pulm): 2 year h/o non-productive cough, episodic; noted congestion, on astelin , allegra; no effect; cough with talking/eating/inhalation; Dx: Chronic cough; Rx: atrovent , albuterol , PFT CBC and CMP 02/10/2023: WBC 6.6,  Eos 100; BUN/Cr 9/0.89 CXR 04/14/2023: no conslidation PFTs 2024: essentially wnl, no obstructive pattern PMH/Meds/All/SocHx/FamHx/ROS:   Past Medical History:  Diagnosis Date   Allergy    Anxiety    Arthritis    Bilateral sacroiliitis (HCC)    Chronic back pain    Depression 2020   in therapy but not on medications   Diabetes mellitus without complication (HCC)    type 2    Fibromyalgia    Headache(784.0)    HX MIGRAINES   History of kidney stones 2020   left parenchymal stone identified on CT 11/23/18   kidney cyst   Hypercholesterolemia    Hypertension    Lumbar spondylosis    Lumbosacral neuritis    Myofascial pain    Paresthesia of lower extremity    Sleep apnea    HAS NOT USED C PAP SINCE 2007 DUE TO WT LOSS    Substance abuse (HCC)    Trochanteric bursitis    left hip     Past Surgical History:  Procedure Laterality Date   ABDOMINAL HYSTERECTOMY  01/22/1995   BUNIONECTOMY  01/21/2010   CESAREAN SECTION     COLONOSCOPY     GASTRIC BYPASS  01/22/2004   JOINT REPLACEMENT     RTKA 7'15  OVARY SURGERY     ROBOTIC ASSISTED LAPAROSCOPIC LYSIS OF ADHESION N/A 04/19/2019   Procedure: XI ROBOTIC ASSISTED LAPAROSCOPIC LYSIS OF ADHESION;  Surgeon: Viktoria Comer SAUNDERS, MD;  Location: [facility];  Service: Gynecology;  Laterality: N/A;   ROBOTIC ASSISTED SALPINGO OOPHERECTOMY Left 04/19/2019   Procedure: XI ROBOTIC ASSISTED SALPINGO OOPHORECTOMY;  Surgeon: Viktoria Comer SAUNDERS, MD;  Location: [facility];  Service: Gynecology;  Laterality: Left;   TOTAL KNEE ARTHROPLASTY Right 08/18/2013   Procedure: RIGHT  TOTAL KNEE ARTHROPLASTY;  Surgeon: Tanda DELENA Heading, MD;  Location: [facility];  Service: Orthopedics;  Laterality: Right;   TOTAL KNEE ARTHROPLASTY Left 04/13/2014   Procedure: LEFT TOTAL KNEE ARTHROPLASTY;  Surgeon: Tanda Heading, MD;  Location: [facility];  Service: Orthopedics;  Laterality: Left;   VENTRAL HERNIA REPAIR N/A 01/11/2019   Procedure: LAPAROSCOPIC VENTRAL HERNIA REPAIR WITH MESH;  Surgeon: Paola Dreama SAILOR, MD;  Location: [facility];  Service: General;  Laterality: N/A;    Family History  Problem Relation Age of Onset   Diabetes Mellitus II Mother    Lupus Mother    Arthritis Mother    Diabetes Mother    Hypertension Mother    Miscarriages / [place] Mother    Lung cancer Father        smoker/worked in factory   Alcohol abuse Father    Cancer Father        smoker, worked in Marine scientist   Early death Father    Hypertension Father    Diabetes Mellitus II Sister    Arthritis Sister    Diabetes Sister    Hypertension Sister    Obesity Sister    Diabetes Mellitus II Brother    Hypercholesterolemia Brother    Diabetes Brother    Cancer Maternal Grandmother        Either uterine or endometrial   Colon cancer Neg Hx    Esophageal cancer Neg Hx    Liver disease Neg Hx    Stomach cancer Neg Hx    Rectal cancer Neg Hx      Social Connections: Moderately Isolated (01/21/2023)   Social Connection and Isolation Panel    Frequency of Communication with Friends and Family: More than three times a week    Frequency of Social Gatherings with Friends and Family: More than three times a week    Attends Religious Services: Never    Database administrator or Organizations: No    Attends Engineer, structural: Not on file    Marital Status: Married      Current Outpatient Medications:    albuterol  (VENTOLIN  HFA) 108 (90 Base) MCG/ACT inhaler, Inhale 2 puffs into the lungs every 6 (six) hours as needed for wheezing or shortness of breath., Disp: 80.4 g, Rfl: 1   amLODipine   (NORVASC ) 5 MG tablet, Take 0.5 tablets (2.5 mg total) by mouth daily., Disp: 90 tablet, Rfl: 3   Atogepant  (QULIPTA ) 60 MG TABS, TAKE 1 TABLET BY MOUTH EVERY DAY, Disp: 90 tablet, Rfl: 0   atorvastatin  (LIPITOR) 20 MG tablet, Take 1 tablet (20 mg total) by mouth daily., Disp: 90 tablet, Rfl: 3   azelastine  (ASTELIN ) 0.1 % nasal spray, Place 2 sprays into both nostrils 2 (two) times daily. Use in each nostril as directed, Disp: 30 mL, Rfl: 12   Blood Glucose Monitoring Suppl DEVI, 1 each by Does not apply route in the morning, at noon, and at bedtime. One Touch Verio Flex per patients insurance, Disp: 1 each, Rfl:  0   buPROPion  (WELLBUTRIN  XL) 300 MG 24 hr tablet, Take 1 tablet (300 mg total) by mouth every morning., Disp: 30 tablet, Rfl: 2   gabapentin  (NEURONTIN ) 600 MG tablet, Take 1 tablet (600 mg total) by mouth 3 (three) times daily., Disp: 270 tablet, Rfl: 3   Glucose Blood (BLOOD GLUCOSE TEST STRIPS) STRP, 1 each by In Vitro route in the morning, at noon, and at bedtime. May substitute to any manufacturer covered by patient's insurance., Disp: 100 each, Rfl: 2   ipratropium (ATROVENT ) 0.03 % nasal spray, Place 2 sprays into both nostrils 2 (two) times daily., Disp: 60 mL, Rfl: 1   lamoTRIgine  (LAMICTAL ) 100 MG tablet, Take 1 tablet (100 mg total) by mouth daily., Disp: 90 tablet, Rfl: 0   Lancets (ONETOUCH DELICA PLUS LANCET33G) MISC, USE 1 LANCET IN THE MORNING, AT NOON AND AT BEDTIME, Disp: 100 each, Rfl: 10   methocarbamol  (ROBAXIN -750) 750 MG tablet, Take 1 tablet (750 mg total) by mouth every 8 (eight) hours as needed for muscle spasms., Disp: 45 tablet, Rfl: 0   tirzepatide  (MOUNJARO ) 5 MG/0.5ML Pen, Inject 5 mg into the skin once a week., Disp: 6 mL, Rfl: 3   traMADol  (ULTRAM ) 50 MG tablet, TAKE 1 TABLET EVERY 8 HOURS AS NEEDED, Disp: 90 tablet, Rfl: 0  Current Facility-Administered Medications:    0.9 %  sodium chloride  infusion, 500 mL, Intravenous, Once, Cirigliano, Vito V, DO    Physical Exam:   BP 114/74 (BP Location: Left Arm, Patient Position: Sitting, Cuff Size: Large)   Pulse 99   Ht 5' 1 (1.549 m)   Wt 199 lb (90.3 kg)   SpO2 96%   BMI 37.60 kg/m   Salient findings:  CN II-XII intact Bilateral EAC clear and TM intact with well pneumatized middle ear spaces Anterior rhinoscopy: Septum intact; bilateral inferior turbinates without significant hypertrophy No lesions of oral cavity/oropharynx No obviously palpable neck masses/lymphadenopathy/thyromegaly No respiratory distress or stridor; voice quality class 1, frequent throat clearing; TFL was indicated to better evaluate the proximal airway, given the patient's history and exam findings, and is detailed below.   Seprately Identifiable Procedures:  Prior to initiating any procedures, risks/benefits/alternatives were explained to the patient and verbal consent obtained. Procedure Note Pre-procedure diagnosis:  Chronic throat clearing Post-procedure diagnosis: Same Procedure: Transnasal Fiberoptic Laryngoscopy, CPT 31575 - Mod 25 Indication: see above Complications: None apparent EBL: 0 mL  The procedure was undertaken to further evaluate the patient's complaint above, with mirror exam inadequate for appropriate examination due to gag reflex and poor patient tolerance  Procedure:  Patient was identified as correct patient. Verbal consent was obtained. The nose was sprayed with oxymetazoline and 4% lidocaine . The The flexible laryngoscope was passed through the nose to view the nasal cavity, pharynx (oropharynx, hypopharynx) and larynx.  The larynx was examined at rest and during multiple phonatory tasks. Documentation was obtained and reviewed with patient. The scope was removed. The patient tolerated the procedure well.  Findings: The nasal cavity and nasopharynx did not reveal any masses or lesions, mucosa appeared to be without obvious lesions. The tongue base, pharyngeal walls, piriform sinuses,  vallecula, epiglottis and postcricoid region are normal in appearance with no significant retained secretions in pyriform or vallecula. Mild age appropriate VF atrophy. The visualized portion of the subglottis and proximal trachea is widely patent. The vocal folds are mobile bilaterally. There are no lesions on the free edge of the vocal folds nor elsewhere in the larynx worrisome for  malignancy.    Electronically signed by: Eldora KATHEE Blanch, MD 07/22/2023 5:04 PM   Impression & Plans:  Kelsey Simmons is a 63 y.o. female with:  1. Chronic throat clearing   2. Chronic cough   3. Vocal fold atrophy   4. Post-nasal drip    Main complaint is throat clearing as cough has essentially resolved (appears to be post viral). She does feel like her PND triggers some of her clearing and we also discussed LPR as a possibility.  We discussed options and overall I do think she will do well with a conservative course of management: - Trial reflux gourmet x4 weeks (sample provided) - Increase hydration - Throat clearing - attempt swallowing, not clearing throat - Will add astelin  since PO antihistamine helps - d/w pt re: cough suppression and throat clearing voice Rx but she declined D/w pt f/u, she prefers to call us  if sx persist or do not improve or next few months  See below regarding exact medications prescribed this encounter including dosages and route: Meds ordered this encounter  Medications   azelastine  (ASTELIN ) 0.1 % nasal spray    Sig: Place 2 sprays into both nostrils 2 (two) times daily. Use in each nostril as directed    Dispense:  30 mL    Refill:  12      Thank you for allowing me the opportunity to care for your patient. Please do not hesitate to contact me should you have any other questions.  Sincerely, Eldora Blanch, MD Otolaryngologist (ENT), [facility] Phone: [phone] Fax: [phone]  07/22/2023, 5:04 PM   MDM:  Level 4 - [phone] Complexity/Problems  addressed: mod - chronic problems Data complexity: mod - independent review of notes, labs, independent imaging interpretation - Morbidity: mod  - Drug prescribed or managed: y

## 2023-07-24 ENCOUNTER — Encounter: Admitting: Physical Medicine & Rehabilitation

## 2023-07-28 ENCOUNTER — Encounter: Attending: Physical Medicine and Rehabilitation | Admitting: Physical Medicine and Rehabilitation

## 2023-07-28 ENCOUNTER — Encounter: Payer: Self-pay | Admitting: Physical Medicine and Rehabilitation

## 2023-07-28 VITALS — BP 107/69 | HR 77 | Ht 61.0 in | Wt 199.2 lb

## 2023-07-28 DIAGNOSIS — M797 Fibromyalgia: Secondary | ICD-10-CM | POA: Diagnosis present

## 2023-07-28 DIAGNOSIS — M7918 Myalgia, other site: Secondary | ICD-10-CM | POA: Insufficient documentation

## 2023-07-28 DIAGNOSIS — M7072 Other bursitis of hip, left hip: Secondary | ICD-10-CM

## 2023-07-28 DIAGNOSIS — Z5181 Encounter for therapeutic drug level monitoring: Secondary | ICD-10-CM

## 2023-07-28 MED ORDER — LIDOCAINE HCL 1 % IJ SOLN
3.0000 mL | Freq: Once | INTRAMUSCULAR | Status: AC
  Administered 2023-07-28: 3 mL

## 2023-07-28 MED ORDER — TRIAMCINOLONE ACETONIDE 40 MG/ML IJ SUSP
8.0000 mg | Freq: Once | INTRAMUSCULAR | Status: AC
Start: 2023-07-28 — End: 2023-07-28
  Administered 2023-07-28: 8 mg

## 2023-07-28 NOTE — Patient Instructions (Addendum)
-   Resume Usual Activities. Notify Physician of any unusual bleeding, erythema or concern for side effects as reviewed above. - Apply ice prn for pain - Tylenol  prn for pain - Follow up Q1M PRN for TPI if benefitting - We will look into appealing to insurance to approve your S1 ESI  injections

## 2023-07-28 NOTE — Progress Notes (Signed)
 HPI:   Kelsey Simmons is a 63 y.o. female with PMHx has Osteoarthritis of right knee; Hx of total knee arthroplasty; History of total knee arthroplasty; Exposure to severe acute respiratory syndrome coronavirus 2 (SARS-CoV-2); Abdominal pain; Acquired spondylolisthesis; Allergic rhinitis; Constipation; Diabetes mellitus without complication (HCC); Fibrocystic breast changes; Fibromyalgia; Gastroesophageal reflux disease without esophagitis; Hypertension; Iron deficiency anemia; Migraine with aura; Pure hypercholesterolemia; Ovarian cyst, left; Rash; Prolapsed lumbar disc; Inflammation of sacroiliac joint (HCC); Bilateral sacroiliitis (HCC); BMI 40.0-44.9, adult (HCC); Bone spur of foot; Cervical spondylitis (HCC); Depression, major, single episode, mild (HCC); Chronic bilateral low back pain with sciatica; Lumbar facet arthropathy; Chronic pain syndrome; Neuropathic pain; Encounter for therapeutic drug monitoring; Encounter for long-term opiate analgesic use; Bilateral foot pain; Bilateral ankle pain; Bilateral knee pain; Cough; Chronic right shoulder pain; Myofascial pain; Arthritis of right shoulder region; Impingement syndrome of right shoulder; Chronic right sacroiliac joint pain; and Cervicalgia on their problem list. who presents to clinic for treatment of pain related to myofscial pain  via injection as described below.    No new concerns or complaints. No major changes in medical history since last visit. Patient says Sherleen denied her S1 joint injection due to lack of documented need.   She had >85% pain reduction for 6 months afte her last injection with Dr. Carilyn. She has an old workers comp case that is closed for >20 years and is concerned this is a complicating factor.   Physical Exam:  General: Appropriate appearance for age.  Mental Status: Appropriate mood and affect.  Cardiovascular: RRR, no m/r/g.  Respiratory: CTAB, no rales/rhonchi/wheezing.  Skin: No apparent rashes or lesions.   Neuro: Awake, alert, and oriented x3. No apparent deficits.  MSK: Moving all 4 limbs antigravity and against resistance.   Palpatory exam revealed ttp at the  bilateral trapezius, levator scapulae, R PSIS and SI joint, cervical and L GTB  .   Back Exam:   Inspection: Pelvis was  even.  Lumbar lordotic curvature was increased.  There was  no evidence of scoliosis.  Palpation: Palpatory exam revealed ttp at the b/l PSIS, SI joints, GTBS, lateral thighs, and lumbar paraspinals . There was  no evidence of spasm.    PROCEDURE: Trigger point injections and L GTB injection  Diagnosis: M79.18 , M70.72 Goals with treatment: [x ] Decrease pain [x]  Improve Active / Passive ROM [  ] Improve ADLs [  ] Improve functional mobility  MEDICATION:  Kenalog  40 mg/mL - 0.2 ccs Lidocaine  1% - 6 ccs   CONSENT: Obtained in writing followed by time-out per clinic policy. Consent uploaded to chart.  Benefits discussed.  Risks discussed included, but were not limited to, pain and discomfort, bleeding, bruising, allergic reaction, infection. All questions answered to patient/family member/guardian/ caregiver satisfaction. They would like to proceed with procedure. There are no noted contraindications to procedure.  PROCEDURE Time out was preformed No heat sources No antibiotics  The patient was explained about both the benefits and risks of a trigger point injection. After the patient acknowledged an understanding of the risks and benefits, the patient agreed to proceed. The area was first marked and then prepped in an aseptic fashion with betadine / alcohol. A 30 g, 1/2 inch needle was directed via a posterior approach into the bilateral trapezius, levator scapulae, cervical and lumbar paraspinals . Then, the L greater trochaneric bursa was parked and palpated, with finding of most discomfort in the superior portion; a 25 G 1.5 inc needle was used  to injection 3 mL solution described below in the area of  pain, with immediate symptomatic improvement The injections were completed with Kenalog  0.2 cc mixed with 6cc of 1% lidocaine  after no blood was aspirated on pull back.  The pt tolerated the procedure well. The patient reported immediate relief of the pain and improved pain free range of motion. No complications were encountered.   Impression: HPI: Kelsey Simmons is a 63 y.o. female with PMHx has Osteoarthritis of right knee; Hx of total knee arthroplasty; History of total knee arthroplasty; Exposure to severe acute respiratory syndrome coronavirus 2 (SARS-CoV-2); Abdominal pain; Acquired spondylolisthesis; Allergic rhinitis; Constipation; Diabetes mellitus without complication (HCC); Fibrocystic breast changes; Fibromyalgia; Gastroesophageal reflux disease without esophagitis; Hypertension; Iron deficiency anemia; Migraine with aura; Pure hypercholesterolemia; Ovarian cyst, left; Rash; Prolapsed lumbar disc; Inflammation of sacroiliac joint (HCC); Bilateral sacroiliitis (HCC); BMI 40.0-44.9, adult (HCC); Bone spur of foot; Cervical spondylitis (HCC); Depression, major, single episode, mild (HCC); Chronic bilateral low back pain with sciatica; Lumbar facet arthropathy; Chronic pain syndrome; Neuropathic pain; Encounter for therapeutic drug monitoring; Encounter for long-term opiate analgesic use; Bilateral foot pain; Bilateral ankle pain; Bilateral knee pain; Cough; Chronic right shoulder pain; Myofascial pain; Arthritis of right shoulder region; Impingement syndrome of right shoulder; Chronic right sacroiliac joint pain; and Cervicalgia on their problem list. who presents to clinic for treatment of myofascial and L GTB pain . They received a  Bilateral  trigger point injections and L GTB injection as above.   PLAN: - Resume Usual Activities. Notify Physician of any unusual bleeding, erythema or concern for side effects as reviewed above. - Apply ice prn for pain - Tylenol  prn for pain - Follow up Q1M PRN  for TPI if benefitting  -We will look into appealing to insurance to approve your S1 ESI injections  Patient/Care Giver was ready to learn without apparent learning barriers. Education was provided on diagnosis, treatment options/plan according to patient's preferred learning style. Patient/Care Giver verbalized understanding and agreement with the above plan.   Kelsey JAYSON Likes, DO 07/28/2023

## 2023-07-31 LAB — DRUG TOX MONITOR 1 W/CONF, ORAL FLD

## 2023-07-31 LAB — DRUG TOX ALC METAB W/CON, ORAL FLD: Alcohol Metabolite: NEGATIVE ng/mL (ref <25)

## 2023-08-01 ENCOUNTER — Encounter: Payer: Self-pay | Admitting: Physical Medicine and Rehabilitation

## 2023-08-05 ENCOUNTER — Ambulatory Visit (HOSPITAL_BASED_OUTPATIENT_CLINIC_OR_DEPARTMENT_OTHER): Admitting: Certified Nurse Midwife

## 2023-08-05 ENCOUNTER — Encounter (HOSPITAL_BASED_OUTPATIENT_CLINIC_OR_DEPARTMENT_OTHER): Payer: Self-pay | Admitting: Certified Nurse Midwife

## 2023-08-05 ENCOUNTER — Other Ambulatory Visit (HOSPITAL_COMMUNITY)
Admission: RE | Admit: 2023-08-05 | Discharge: 2023-08-05 | Disposition: A | Discharge status: Home / Self Care | Source: Ambulatory Visit | Attending: Certified Nurse Midwife | Admitting: Certified Nurse Midwife

## 2023-08-05 VITALS — BP 117/64 | HR 81 | Ht 61.0 in | Wt 198.8 lb

## 2023-08-05 DIAGNOSIS — Z1231 Encounter for screening mammogram for malignant neoplasm of breast: Secondary | ICD-10-CM

## 2023-08-05 DIAGNOSIS — Z124 Encounter for screening for malignant neoplasm of cervix: Secondary | ICD-10-CM

## 2023-08-05 DIAGNOSIS — Z01419 Encounter for gynecological examination (general) (routine) without abnormal findings: Secondary | ICD-10-CM | POA: Diagnosis not present

## 2023-08-05 NOTE — Progress Notes (Signed)
 63 y.o. G64P1102 Married [place] American female here for annual exam.    No LMP recorded. Patient has had a hysterectomy. Pt states both ovaries have been surgically removed. She thinks that her hysterectomy was supracervical (cervix still intact).    Sexually active: Yes.    The current method of family planning is status post hysterectomy.    Exercising: Yes.     Smoker:  no  Health Maintenance: Pap:  03/19/2018 History of abnormal Pap:  no MMG:  07/31/2022 Negative Colonoscopy:  03/26/2023 BMD:   12/29/2014 Screening Labs: PCP   reports that she has never smoked. She has never been exposed to tobacco smoke. She has never used smokeless tobacco. She reports that she does not currently use alcohol. She reports that she does not currently use drugs after having used the following drugs: Crack cocaine.  Past Medical History:  Diagnosis Date   Allergy    Anxiety    Arthritis    Bilateral sacroiliitis (HCC)    Chronic back pain    Depression 2020   in therapy but not on medications   Diabetes mellitus without complication (HCC)    type 2    Fibromyalgia    Headache(784.0)    HX MIGRAINES   History of kidney stones 2020   left parenchymal stone identified on CT 11/23/18   kidney cyst   Hypercholesterolemia    Hypertension    Lumbar spondylosis    Lumbosacral neuritis    Myofascial pain    Paresthesia of lower extremity    Sleep apnea    HAS NOT USED C PAP SINCE 2007 DUE TO WT LOSS    Substance abuse (HCC)    Trochanteric bursitis    left hip    Past Surgical History:  Procedure Laterality Date   ABDOMINAL HYSTERECTOMY  01/22/1995   BUNIONECTOMY  01/21/2010   CESAREAN SECTION     COLONOSCOPY     GASTRIC BYPASS  01/22/2004   JOINT REPLACEMENT     RTKA 7'15   OVARY SURGERY     ROBOTIC ASSISTED LAPAROSCOPIC LYSIS OF ADHESION N/A 04/19/2019   Procedure: XI ROBOTIC ASSISTED LAPAROSCOPIC LYSIS OF ADHESION;  Surgeon: Viktoria Comer SAUNDERS, MD;  Location: [facility];   Service: Gynecology;  Laterality: N/A;   ROBOTIC ASSISTED SALPINGO OOPHERECTOMY Left 04/19/2019   Procedure: XI ROBOTIC ASSISTED SALPINGO OOPHORECTOMY;  Surgeon: Viktoria Comer SAUNDERS, MD;  Location: [facility];  Service: Gynecology;  Laterality: Left;   TOTAL KNEE ARTHROPLASTY Right 08/18/2013   Procedure: RIGHT TOTAL KNEE ARTHROPLASTY;  Surgeon: Tanda DELENA Heading, MD;  Location: [facility];  Service: Orthopedics;  Laterality: Right;   TOTAL KNEE ARTHROPLASTY Left 04/13/2014   Procedure: LEFT TOTAL KNEE ARTHROPLASTY;  Surgeon: Tanda Heading, MD;  Location: [facility];  Service: Orthopedics;  Laterality: Left;   VENTRAL HERNIA REPAIR N/A 01/11/2019   Procedure: LAPAROSCOPIC VENTRAL HERNIA REPAIR WITH MESH;  Surgeon: Paola Dreama SAILOR, MD;  Location: [facility];  Service: General;  Laterality: N/A;    Current Outpatient Medications  Medication Sig Dispense Refill   albuterol  (VENTOLIN  HFA) 108 (90 Base) MCG/ACT inhaler Inhale 2 puffs into the lungs every 6 (six) hours as needed for wheezing or shortness of breath. 80.4 g 1   amLODipine  (NORVASC ) 5 MG tablet Take 0.5 tablets (2.5 mg total) by mouth daily. 90 tablet 3   Atogepant  (QULIPTA ) 60 MG TABS TAKE 1 TABLET BY MOUTH EVERY DAY 90 tablet 0   atorvastatin  (LIPITOR) 20 MG tablet Take 1  tablet (20 mg total) by mouth daily. 90 tablet 3   azelastine  (ASTELIN ) 0.1 % nasal spray Place 2 sprays into both nostrils 2 (two) times daily. Use in each nostril as directed 30 mL 12   Blood Glucose Monitoring Suppl DEVI 1 each by Does not apply route in the morning, at noon, and at bedtime. One Touch Verio Flex per patients insurance 1 each 0   buPROPion  (WELLBUTRIN  XL) 300 MG 24 hr tablet Take 1 tablet (300 mg total) by mouth every morning. 30 tablet 2   gabapentin  (NEURONTIN ) 600 MG tablet Take 1 tablet (600 mg total) by mouth 3 (three) times daily. 270 tablet 3   Glucose Blood (BLOOD GLUCOSE TEST STRIPS) STRP 1 each by In Vitro route in the morning, at noon, and at bedtime.  May substitute to any manufacturer covered by patient's insurance. 100 each 2   ipratropium (ATROVENT ) 0.03 % nasal spray Place 2 sprays into both nostrils 2 (two) times daily. 60 mL 1   lamoTRIgine  (LAMICTAL ) 100 MG tablet Take 1 tablet (100 mg total) by mouth daily. 90 tablet 0   Lancets (ONETOUCH DELICA PLUS LANCET33G) MISC USE 1 LANCET IN THE MORNING, AT NOON AND AT BEDTIME 100 each 10   methocarbamol  (ROBAXIN -750) 750 MG tablet Take 1 tablet (750 mg total) by mouth every 8 (eight) hours as needed for muscle spasms. 45 tablet 0   tirzepatide  (MOUNJARO ) 5 MG/0.5ML Pen Inject 5 mg into the skin once a week. 6 mL 3   traMADol  (ULTRAM ) 50 MG tablet TAKE 1 TABLET EVERY 8 HOURS AS NEEDED 90 tablet 0   Current Facility-Administered Medications  Medication Dose Route Frequency Provider Last Rate Last Admin   0.9 %  sodium chloride  infusion  500 mL Intravenous Once Cirigliano, Vito V, DO        Family History  Problem Relation Age of Onset   Diabetes Mellitus II Mother    Lupus Mother    Arthritis Mother    Diabetes Mother    Hypertension Mother    Miscarriages / [place] Mother    Lung cancer Father        smoker/worked in factory   Alcohol abuse Father    Cancer Father        smoker, worked in Marine scientist   Early death Father    Hypertension Father    Diabetes Mellitus II Sister    Arthritis Sister    Diabetes Sister    Hypertension Sister    Obesity Sister    Diabetes Mellitus II Brother    Hypercholesterolemia Brother    Diabetes Brother    Cancer Maternal Grandmother        Either uterine or endometrial   Colon cancer Neg Hx    Esophageal cancer Neg Hx    Liver disease Neg Hx    Stomach cancer Neg Hx    Rectal cancer Neg Hx     ROS: Constitutional: negative Genitourinary:negative  Exam:   BP 117/64   Pulse 81   Ht 5' 1 (1.549 m) Comment: Reported  Wt 198 lb 12.8 oz (90.2 kg)   BMI 37.56 kg/m   Height: 5' 1 (154.9 cm) (Reported)  General appearance: alert,  cooperative and appears stated age Head: Normocephalic, without obvious abnormality, atraumatic Lungs: clear to auscultation bilaterally Breasts: normal appearance, no masses or tenderness, Inspection negative, No nipple retraction or dimpling, No nipple discharge or bleeding, No axillary or supraclavicular adenopathy, Normal to palpation without dominant masses, Taught monthly breast  self examination Heart: regular rate and rhythm Abdomen: soft, non-tender; bowel sounds normal; no masses,  no organomegaly Extremities: extremities normal, atraumatic, no cyanosis or edema Skin: Skin color, texture, turgor normal. No rashes or lesions Lymph nodes: Cervical, supraclavicular, and axillary nodes normal. No abnormal inguinal nodes palpated Neurologic: Grossly normal   Pelvic: External genitalia:  no lesions              Urethra:  normal appearing urethra with no masses, tenderness or lesions              Bartholins and Skenes: normal                 Vagina: normal appearing vagina with normal color and no discharge, no lesions              Cervix: no bleeding following Pap              Pap taken: Yes.   Bimanual Exam:  Uterus:  uterus absent              Adnexa: no mass, fullness, tenderness             Anus:  normal sphincter tone, no lesions  Chaperone, CMA, was present for exam.  Assessment/Plan:  1. Encounter for annual routine gynecological examination - Breast self awareness encouraged  2. Screening for cervical cancer (Primary) - Cytology - PAP( [place])  3. Encounter for screening mammogram for malignant neoplasm of breast - MM 3D SCREENING MAMMOGRAM BILATERAL BREAST; Future   RTO 1 year for annual gyn exam. Arland MARLA Roller

## 2023-08-11 ENCOUNTER — Ambulatory Visit (HOSPITAL_BASED_OUTPATIENT_CLINIC_OR_DEPARTMENT_OTHER): Payer: Self-pay | Admitting: Obstetrics & Gynecology

## 2023-08-11 LAB — CYTOLOGY - PAP
Adequacy: ABSENT
Comment: NEGATIVE
Diagnosis: NEGATIVE
High risk HPV: NEGATIVE

## 2023-08-14 ENCOUNTER — Other Ambulatory Visit (HOSPITAL_BASED_OUTPATIENT_CLINIC_OR_DEPARTMENT_OTHER): Payer: Self-pay | Admitting: Family Medicine

## 2023-08-14 DIAGNOSIS — E119 Type 2 diabetes mellitus without complications: Secondary | ICD-10-CM

## 2023-08-20 ENCOUNTER — Encounter (HOSPITAL_BASED_OUTPATIENT_CLINIC_OR_DEPARTMENT_OTHER): Payer: Self-pay | Admitting: Family Medicine

## 2023-08-21 MED ORDER — HYDROCHLOROTHIAZIDE 25 MG PO TABS: 25.0000 mg | ORAL_TABLET | Freq: Every day | ORAL | 1 refills | Status: DC

## 2023-08-27 ENCOUNTER — Encounter (HOSPITAL_COMMUNITY): Payer: Self-pay | Admitting: Psychiatry

## 2023-08-27 ENCOUNTER — Telehealth (HOSPITAL_BASED_OUTPATIENT_CLINIC_OR_DEPARTMENT_OTHER): Admitting: Psychiatry

## 2023-08-27 ENCOUNTER — Other Ambulatory Visit (HOSPITAL_COMMUNITY): Payer: Self-pay | Admitting: Psychiatry

## 2023-08-27 DIAGNOSIS — F411 Generalized anxiety disorder: Secondary | ICD-10-CM

## 2023-08-27 DIAGNOSIS — F330 Major depressive disorder, recurrent, mild: Secondary | ICD-10-CM

## 2023-08-27 MED ORDER — LAMOTRIGINE 100 MG PO TABS: 100.0000 mg | ORAL_TABLET | Freq: Every day | ORAL | 0 refills | Status: DC

## 2023-08-27 MED ORDER — BUPROPION HCL ER (XL) 300 MG PO TB24
300.0000 mg | ORAL_TABLET | ORAL | 2 refills | Status: DC
Start: 2023-08-27 — End: 2023-11-19

## 2023-08-27 NOTE — Progress Notes (Signed)
 [facility] MD Virtual Progress Note   Patient Location: Home Provider Location: Home Office  I connect with patient by video and verified that I am speaking with correct person by using two identifiers. I discussed the limitations of evaluation and management by telemedicine and the availability of in person appointments. I also discussed with the patient that there may be a patient responsible charge related to this service. The patient expressed understanding and agreed to proceed.  Kelsey Simmons [id number] 63 y.o.  08/27/2023 10:29 AM  History of Present Illness:  Patient is evaluated by video session.  She reported symptoms are stable other than situational anxiety and depression when she talked to her 51 year old grand kid on the phone.  She reported she feels bad because he complained about his living situation but also patient realized that he is not doing his chores very well.  Lately her 62 year old is not sure about his identity.  Patient's therapist recommended to be supportive with the grandkid but also need to have his responsibilities to be done regularly.  Patient otherwise doing very well.  She is sleeping good.  She denies any crying spells or any feeling of hopelessness or worthlessness.  Her job is keeping her busy.  Recently her 19 year old daughter moved with the patient because of her chronic illness and not able to function due to pain.  Patient told her daughter sold the house in few days and she is happy about it.  Patient feels good because now she can take care of her daughter and her husband while they are living at home.  Sometimes does not want to leave her husband because he had chronic health issues.  Patient excited about upcoming trip to attend her mother's 80th birthday in September in [place], [place] .  And then she is going to [facility] in January to attend one of her family members birthday and same month she is going to [place] cruise trip with  her sister.  She reported appetite is good.  Weight is stable.  She denies any hopelessness or worthlessness.  Denies any panic attack.  She denies any shakes, tremors or itching.  She wants to continue current dose of Lamictal  and Wellbutrin .   Past Psychiatric History: H/O drug use and brief psychiatric inpatient in mid 34s for passive suicidal thoughts.  No history of attempt.  H/O twice rehab program.  H/O marijuana, crack and cocaine use.  Tried Paxil at that time but never consistent with treatment and follow-up.  PCP tried Cymbalta  up to 120 mg but caused diplopia. Never seen psychiatrist before. No h/o psychosis, mania.     Past Medical History:  Diagnosis Date   Allergy    Anxiety    Arthritis    Bilateral sacroiliitis (HCC)    Chronic back pain    Depression 2020   in therapy but not on medications   Diabetes mellitus without complication (HCC)    type 2    Fibromyalgia    Headache(784.0)    HX MIGRAINES   History of kidney stones 2020   left parenchymal stone identified on CT 11/23/18   kidney cyst   Hypercholesterolemia    Hypertension    Lumbar spondylosis    Lumbosacral neuritis    Myofascial pain    Paresthesia of lower extremity    Sleep apnea    HAS NOT USED C PAP SINCE 2007 DUE TO WT LOSS    Substance abuse (HCC)    Trochanteric bursitis  left hip    Outpatient Encounter Medications as of 08/27/2023  Medication Sig   albuterol  (VENTOLIN  HFA) 108 (90 Base) MCG/ACT inhaler Inhale 2 puffs into the lungs every 6 (six) hours as needed for wheezing or shortness of breath.   amLODipine  (NORVASC ) 5 MG tablet Take 0.5 tablets (2.5 mg total) by mouth daily.   Atogepant  (QULIPTA ) 60 MG TABS TAKE 1 TABLET BY MOUTH EVERY DAY   atorvastatin  (LIPITOR) 20 MG tablet Take 1 tablet (20 mg total) by mouth daily.   azelastine  (ASTELIN ) 0.1 % nasal spray Place 2 sprays into both nostrils 2 (two) times daily. Use in each nostril as directed   Blood Glucose Monitoring Suppl DEVI 1  each by Does not apply route in the morning, at noon, and at bedtime. One Touch Verio Flex per patients insurance   buPROPion  (WELLBUTRIN  XL) 300 MG 24 hr tablet Take 1 tablet (300 mg total) by mouth every morning.   gabapentin  (NEURONTIN ) 600 MG tablet Take 1 tablet (600 mg total) by mouth 3 (three) times daily.   hydrochlorothiazide  (HYDRODIURIL ) 25 MG tablet Take 1 tablet (25 mg total) by mouth daily.   ipratropium (ATROVENT ) 0.03 % nasal spray Place 2 sprays into both nostrils 2 (two) times daily.   lamoTRIgine  (LAMICTAL ) 100 MG tablet Take 1 tablet (100 mg total) by mouth daily.   Lancets (ONETOUCH DELICA PLUS LANCET33G) MISC USE 1 LANCET IN THE MORNING, AT NOON AND AT BEDTIME   methocarbamol  (ROBAXIN -750) 750 MG tablet Take 1 tablet (750 mg total) by mouth every 8 (eight) hours as needed for muscle spasms.   ONETOUCH VERIO test strip USE 1 STRIP IN THE MORNING, AT NOON AND AT BEDTIME   tirzepatide  (MOUNJARO ) 5 MG/0.5ML Pen Inject 5 mg into the skin once a week.   traMADol  (ULTRAM ) 50 MG tablet TAKE 1 TABLET EVERY 8 HOURS AS NEEDED   Facility-Administered Encounter Medications as of 08/27/2023  Medication   0.9 %  sodium chloride  infusion    Recent Results (from the past 2160 hours)  Drug Tox Alc Metab w/Con, Oral Fld     Status: None   Collection Time: 07/28/23  4:02 PM  Result Value Ref Range   Alcohol Metabolite NEGATIVE <25 ng/mL    Comment: . For additional information, please refer to http://education.questdiagnostics.com/faq/FAQ183 (This link is being provided for informational/ educational purposes only.) . This drug testing is for medical treatment only. Analysis was performed as non-forensic testing and these results should be used only by healthcare providers to render diagnosis or treatment, or to monitor progress of medical conditions. . For assistance with interpreting these drug results, please contact a [employer] Toxicology Specialist: [phone] TOX  [phone]), M-F, 8am-6pm EST. SABRA These tests were developed and their analytical performance characteristics have been determined by [facility]. They have not been cleared or approved by the FDA. These assays have been validated pursuant to the CLIA regulations and are used for clinical purposes.   Drug Tox Monitor 1 w/Conf, Oral Fld     Status: Abnormal   Collection Time: 07/28/23  4:02 PM  Result Value Ref Range   Amphetamines NEGATIVE <10 ng/mL   Barbiturates NEGATIVE <10 ng/mL   Benzodiazepines NEGATIVE <0.50 ng/mL   Buprenorphine NEGATIVE <0.10 ng/mL   Cocaine NEGATIVE <5.0 ng/mL   Fentanyl  NEGATIVE <0.10 ng/mL   Heroin Metabolite NEGATIVE <1.0 ng/mL   MARIJUANA NEGATIVE <2.5 ng/mL   MDMA NEGATIVE <10 ng/mL   Meprobamate NEGATIVE <2.5 ng/mL   Methadone NEGATIVE <  5.0 ng/mL   Nicotine Metabolite NEGATIVE <5.0 ng/mL   Opiates NEGATIVE <2.5 ng/mL   Phencyclidine NEGATIVE <10 ng/mL   Tapentadol NEGATIVE <5.0 ng/mL   Tramadol  POSITIVE (A) <5.0 ng/mL   Tramadol  >500.0 (H) <5.0 ng/mL   Zolpidem NEGATIVE <5.0 ng/mL    Comment: . For additional information, please refer to http://education.[facility].com/faq/FAQ186 (This link is being provided for informational/ educational purposes only.) . This drug testing is for medical treatment only. Analysis was performed as non-forensic testing and these results should be used only by healthcare providers to render diagnosis or treatment, or to monitor progress of medical conditions. . For assistance with interpreting these drug results, please contact a [employer] Toxicology Specialist: [phone] TOX [phone]), M-F, 8am-6pm EST. SABRA These tests were developed and their analytical performance characteristics have been determined by [facility]. They have not been cleared or approved by the FDA. These assays have been validated pursuant to the CLIA regulations and are used for clinical  purposes.   Cytology - PAP( [place])     Status: None   Collection Time: 08/05/23  2:42 PM  Result Value Ref Range   High risk HPV Negative    Adequacy      Satisfactory for evaluation; transformation zone component ABSENT.   Diagnosis      - Negative for intraepithelial lesion or malignancy (NILM)   Comment Normal Reference Range HPV - Negative      Psychiatric Specialty Exam: Physical Exam  Review of Systems  Weight 198 lb (89.8 kg).There is no height or weight on file to calculate BMI.  General Appearance: Casual  Eye Contact:  Good  Speech:  Clear and Coherent  Volume:  Normal  Mood:  Euthymic  Affect:  Appropriate  Thought Process:  Goal Directed  Orientation:  Full (Time, Place, and Person)  Thought Content:  WDL  Suicidal Thoughts:  No  Homicidal Thoughts:  No  Memory:  Immediate;   Good Recent;   Good Remote;   Good  Judgement:  Good  Insight:  Good  Psychomotor Activity:  Normal  Concentration:  Concentration: Good and Attention Span: Good  Recall:  Good  Fund of Knowledge:  Good  Language:  Good  Akathisia:  No  Handed:  Right  AIMS (if indicated):     Assets:  Communication Skills Desire for Improvement Housing Resilience Social Support Talents/Skills  ADL's:  Intact  Cognition:  WNL  Sleep:  good       08/05/2023    2:16 PM 07/28/2023    3:32 PM 06/11/2023    3:39 PM 05/28/2023    3:35 PM 04/14/2023    2:21 PM  Depression screen PHQ 2/9  Decreased Interest 0 1 1 0 0  Down, Depressed, Hopeless 0 1 1 0 1  PHQ - 2 Score 0 2 2 0 1  Altered sleeping    0 2  Tired, decreased energy    0 1  Change in appetite    0 2  Feeling bad or failure about yourself     0 1  Trouble concentrating    0 1  Moving slowly or fidgety/restless    0 3  Suicidal thoughts    0 0  PHQ-9 Score    0 11  Difficult doing work/chores    Not difficult at all Somewhat difficult    Assessment/Plan: MDD (major depressive disorder), recurrent episode, mild (HCC) - Plan:  buPROPion  (WELLBUTRIN  XL) 300 MG 24 hr tablet, lamoTRIgine  (LAMICTAL ) 100  MG tablet  GAD (generalized anxiety disorder) - Plan: buPROPion  (WELLBUTRIN  XL) 300 MG 24 hr tablet, lamoTRIgine  (LAMICTAL ) 100 MG tablet  Review blood work results and collateral information from other provider.  Discussed situational depression when she talk to her 70 year old grandson.  Reassurance provided and encourage to be supportive but also from with the responsibilities.  Continue Lamictal  100 mg daily and Wellbutrin  XL 300 mg daily.  She is seeing a therapist Philippe regularly.  Recommend to call back if you have any question or any concern.  Follow-up in 3 months.   Follow Up Instructions:     I discussed the assessment and treatment plan with the patient. The patient was provided an opportunity to ask questions and all were answered. The patient agreed with the plan and demonstrated an understanding of the instructions.   The patient was advised to call back or seek an in-person evaluation if the symptoms worsen or if the condition fails to improve as anticipated.    Collaboration of Care: Other provider involved in patient's care AEB notes are available in epic to review  Patient/Guardian was advised Release of Information must be obtained prior to any record release in order to collaborate their care with an outside provider. Patient/Guardian was advised if they have not already done so to contact the registration department to sign all necessary forms in order for us  to release information regarding their care.   Consent: Patient/Guardian gives verbal consent for treatment and assignment of benefits for services provided during this visit. Patient/Guardian expressed understanding and agreed to proceed.     Total encounter time 21 minutes which includes face-to-face time, chart reviewed, care coordination, order entry and documentation during this encounter.   Note: This document was prepared by [employer]  voice dictation technology and any errors that results from this process are unintentional.    Leni ONEIDA Client, MD 08/27/2023

## 2023-09-23 ENCOUNTER — Encounter (HOSPITAL_COMMUNITY): Payer: Self-pay | Admitting: *Deleted

## 2023-09-23 ENCOUNTER — Encounter (HOSPITAL_COMMUNITY): Payer: Self-pay

## 2023-10-22 ENCOUNTER — Other Ambulatory Visit (HOSPITAL_BASED_OUTPATIENT_CLINIC_OR_DEPARTMENT_OTHER): Payer: Self-pay | Admitting: Family Medicine

## 2023-10-22 DIAGNOSIS — G43109 Migraine with aura, not intractable, without status migrainosus: Secondary | ICD-10-CM

## 2023-11-04 ENCOUNTER — Other Ambulatory Visit (HOSPITAL_BASED_OUTPATIENT_CLINIC_OR_DEPARTMENT_OTHER): Payer: Self-pay | Admitting: Family Medicine

## 2023-11-04 DIAGNOSIS — E1169 Type 2 diabetes mellitus with other specified complication: Secondary | ICD-10-CM

## 2023-11-13 ENCOUNTER — Telehealth: Payer: Self-pay

## 2023-11-13 NOTE — Telephone Encounter (Signed)
 Patient calling into the office to see if she can get a large joint injection (Hip) during the appointment on 01/01/24 that is already scheduled for trigger point injections or will she need to schedule a separate appointment?    Message sent to Provider regarding appointment.

## 2023-11-14 NOTE — Telephone Encounter (Signed)
 It depends on the injection; last visit we discussed S1 epidural steroid injections with Dr Carilyn in the past and we have discussed SI joint injections. Either would be done with Dr Carilyn. If she's talking about something else, it would depend on the injection.

## 2023-11-19 ENCOUNTER — Encounter (HOSPITAL_COMMUNITY): Payer: Self-pay

## 2023-11-19 ENCOUNTER — Encounter (HOSPITAL_COMMUNITY): Payer: Self-pay | Admitting: Psychiatry

## 2023-11-19 ENCOUNTER — Telehealth (HOSPITAL_COMMUNITY): Admitting: Psychiatry

## 2023-11-19 ENCOUNTER — Other Ambulatory Visit (HOSPITAL_COMMUNITY): Payer: Self-pay | Admitting: Psychiatry

## 2023-11-19 VITALS — Wt 185.0 lb

## 2023-11-19 DIAGNOSIS — F330 Major depressive disorder, recurrent, mild: Secondary | ICD-10-CM

## 2023-11-19 DIAGNOSIS — F411 Generalized anxiety disorder: Secondary | ICD-10-CM

## 2023-11-19 MED ORDER — LAMOTRIGINE 100 MG PO TABS: 100.0000 mg | ORAL_TABLET | Freq: Every day | ORAL | 0 refills | Status: AC

## 2023-11-19 MED ORDER — BUPROPION HCL ER (XL) 300 MG PO TB24: 300.0000 mg | ORAL_TABLET | ORAL | 2 refills | Status: AC

## 2023-11-19 NOTE — Progress Notes (Signed)
 [facility] MD Virtual Progress Note   Patient Location: Home Provider Location: Home Office  I connect with patient by video and verified that I am speaking with correct person by using two identifiers. I discussed the limitations of evaluation and management by telemedicine and the availability of in person appointments. I also discussed with the patient that there may be a patient responsible charge related to this service. The patient expressed understanding and agreed to proceed.  Kelsey Simmons [id number] 63 y.o.  11/19/2023 10:07 AM  History of Present Illness:  Patient is evaluated by video session.  She requested few weeks early appointment because she is going to out of the medication and does not want to be without refills.  She reported things are going very well.  She had a visit to [facility] to see her 68 year old mother on her birthday.  She also visited grandkids.  She is very pleased that this time noticed much improvement in the relationship with the kids mother and grandkids.  She is very relieved.  She also physically doing better.  She lost more than 10 pounds in past few months.  She is more active and started doing bicycle and watching her calorie intake.  She denies any crying spells, feeling of hopelessness or worthlessness patient denies any suicidal thoughts.  She is not as anxious and does not feel overwhelmed when she talk with the grandkids.  She is sleeping better.  She like to keep the Lamictal  and Wellbutrin .  She has no tremor or shakes or any EPS.  She had mention to have a plan to go to [place] in January to attend one of her family members but stay in the same when she is going to [place] cruise trip with her sister.  Past Psychiatric History: H/O drug use and brief psychiatric inpatient in mid 26s for passive suicidal thoughts.  No history of attempt.  H/O twice rehab program.  H/O marijuana, crack and cocaine use.  Tried Paxil at that time but  never consistent with treatment and follow-up.  PCP tried Cymbalta  up to 120 mg but caused diplopia. Never seen psychiatrist before. No h/o psychosis, mania.   Past Medical History:  Diagnosis Date   Allergy    Anxiety    Arthritis    Bilateral sacroiliitis    Chronic back pain    Depression 2020   in therapy but not on medications   Diabetes mellitus without complication (HCC)    type 2    Fibromyalgia    Headache(784.0)    HX MIGRAINES   History of kidney stones 2020   left parenchymal stone identified on CT 11/23/18   kidney cyst   Hypercholesterolemia    Hypertension    Lumbar spondylosis    Lumbosacral neuritis    Myofascial pain    Paresthesia of lower extremity    Sleep apnea    HAS NOT USED C PAP SINCE 2007 DUE TO WT LOSS    Substance abuse (HCC)    Trochanteric bursitis    left hip    Outpatient Encounter Medications as of 11/19/2023  Medication Sig   albuterol  (VENTOLIN  HFA) 108 (90 Base) MCG/ACT inhaler Inhale 2 puffs into the lungs every 6 (six) hours as needed for wheezing or shortness of breath.   amLODipine  (NORVASC ) 5 MG tablet Take 0.5 tablets (2.5 mg total) by mouth daily.   atorvastatin  (LIPITOR) 20 MG tablet Take 1 tablet (20 mg total) by mouth daily.   azelastine  (ASTELIN )  0.1 % nasal spray Place 2 sprays into both nostrils 2 (two) times daily. Use in each nostril as directed   Blood Glucose Monitoring Suppl DEVI 1 each by Does not apply route in the morning, at noon, and at bedtime. One Touch Verio Flex per patients insurance   buPROPion  (WELLBUTRIN  XL) 300 MG 24 hr tablet Take 1 tablet (300 mg total) by mouth every morning.   gabapentin  (NEURONTIN ) 600 MG tablet Take 1 tablet (600 mg total) by mouth 3 (three) times daily.   hydrochlorothiazide  (HYDRODIURIL ) 25 MG tablet Take 1 tablet (25 mg total) by mouth daily.   ipratropium (ATROVENT ) 0.03 % nasal spray Place 2 sprays into both nostrils 2 (two) times daily.   lamoTRIgine  (LAMICTAL ) 100 MG tablet  Take 1 tablet (100 mg total) by mouth daily.   Lancets (ONETOUCH DELICA PLUS LANCET33G) MISC USE 1 LANCET IN THE MORNING, AT NOON AND AT BEDTIME   methocarbamol  (ROBAXIN -750) 750 MG tablet Take 1 tablet (750 mg total) by mouth every 8 (eight) hours as needed for muscle spasms.   MOUNJARO  5 MG/0.5ML Pen INJECT 5 MG UNDER THE SKIN WEEKLY   ONETOUCH VERIO test strip USE 1 STRIP IN THE MORNING, AT NOON AND AT BEDTIME   QULIPTA  60 MG TABS TAKE 1 TABLET BY MOUTH EVERY DAY   traMADol  (ULTRAM ) 50 MG tablet TAKE 1 TABLET EVERY 8 HOURS AS NEEDED   Facility-Administered Encounter Medications as of 11/19/2023  Medication   0.9 %  sodium chloride  infusion    No results found for this or any previous visit (from the past 2160 hours).   Psychiatric Specialty Exam: Physical Exam  Review of Systems  There were no vitals taken for this visit.There is no height or weight on file to calculate BMI.  General Appearance: Casual  Eye Contact:  Good  Speech:  Clear and Coherent and Normal Rate  Volume:  Normal  Mood:  Euthymic  Affect:  Congruent  Thought Process:  Goal Directed  Orientation:  Full (Time, Place, and Person)  Thought Content:  WDL  Suicidal Thoughts:  No  Homicidal Thoughts:  No  Memory:  Immediate;   Good Recent;   Good Remote;   Good  Judgement:  Good  Insight:  Present  Psychomotor Activity:  Normal  Concentration:  Concentration: Good and Attention Span: Good  Recall:  Good  Fund of Knowledge:  Good  Language:  Good  Akathisia:  No  Handed:  Right  AIMS (if indicated):     Assets:  Communication Skills Desire for Improvement Housing Talents/Skills Transportation  ADL's:  Intact  Cognition:  WNL  Sleep: Good       08/05/2023    2:16 PM 07/28/2023    3:32 PM 06/11/2023    3:39 PM 05/28/2023    3:35 PM 04/14/2023    2:21 PM  Depression screen PHQ 2/9  Decreased Interest 0 1 1 0 0  Down, Depressed, Hopeless 0 1 1 0 1  PHQ - 2 Score 0 2 2 0 1  Altered sleeping    0 2   Tired, decreased energy    0 1  Change in appetite    0 2  Feeling bad or failure about yourself     0 1  Trouble concentrating    0 1  Moving slowly or fidgety/restless    0 3  Suicidal thoughts    0 0  PHQ-9 Score    0 11  Difficult doing work/chores  Not difficult at all Somewhat difficult    Assessment/Plan: MDD (major depressive disorder), recurrent episode, mild - Plan: buPROPion  (WELLBUTRIN  XL) 300 MG 24 hr tablet, lamoTRIgine  (LAMICTAL ) 100 MG tablet  GAD (generalized anxiety disorder) - Plan: buPROPion  (WELLBUTRIN  XL) 300 MG 24 hr tablet, lamoTRIgine  (LAMICTAL ) 100 MG tablet  Patient is 63 year old African-American employed female with history of hypertension, migraine, GERD, diabetes, osteoarthritis, major depressive disorder and anxiety.  Currently stable on Wellbutrin  XL 300 mg daily and Lamictal  100 mg daily.  She has no rash or any itching.  Her stress level is much better as better relationship with the grandkids and their mother.  Does not want to change the medication.  Continue current medication.  Recommend to call back if she has any question or any concern.  Follow-up in 3 months.   Follow Up Instructions:     I discussed the assessment and treatment plan with the patient. The patient was provided an opportunity to ask questions and all were answered. The patient agreed with the plan and demonstrated an understanding of the instructions.   The patient was advised to call back or seek an in-person evaluation if the symptoms worsen or if the condition fails to improve as anticipated.    Collaboration of Care: Other provider involved in patient's care AEB notes are available in epic to review  Patient/Guardian was advised Release of Information must be obtained prior to any record release in order to collaborate their care with an outside provider. Patient/Guardian was advised if they have not already done so to contact the registration department to sign all  necessary forms in order for us  to release information regarding their care.   Consent: Patient/Guardian gives verbal consent for treatment and assignment of benefits for services provided during this visit. Patient/Guardian expressed understanding and agreed to proceed.     Total encounter time 18 minutes which includes face-to-face time, chart reviewed, care coordination, order entry and documentation during this encounter.   Note: This document was prepared by [employer] voice dictation technology and any errors that results from this process are unintentional.    Leni ONEIDA Client, MD 11/19/2023

## 2023-12-11 ENCOUNTER — Telehealth: Payer: Self-pay | Admitting: Physical Medicine and Rehabilitation

## 2023-12-11 NOTE — Telephone Encounter (Signed)
 P is wanting to speak to someone about her back pain

## 2023-12-15 ENCOUNTER — Encounter: Admitting: Physical Medicine and Rehabilitation

## 2023-12-22 ENCOUNTER — Encounter: Payer: Self-pay | Attending: Physical Medicine and Rehabilitation | Admitting: Physical Medicine and Rehabilitation

## 2023-12-22 VITALS — BP 101/62 | HR 89 | Ht 61.0 in | Wt 184.0 lb

## 2023-12-22 DIAGNOSIS — M797 Fibromyalgia: Secondary | ICD-10-CM | POA: Insufficient documentation

## 2023-12-22 DIAGNOSIS — G894 Chronic pain syndrome: Secondary | ICD-10-CM | POA: Diagnosis not present

## 2023-12-22 DIAGNOSIS — Z79891 Long term (current) use of opiate analgesic: Secondary | ICD-10-CM | POA: Diagnosis not present

## 2023-12-22 DIAGNOSIS — M5416 Radiculopathy, lumbar region: Secondary | ICD-10-CM | POA: Diagnosis present

## 2023-12-22 MED ORDER — TRAMADOL HCL 50 MG PO TABS: 50.0000 mg | ORAL_TABLET | Freq: Three times a day (TID) | ORAL | 5 refills | Status: AC | PRN

## 2023-12-22 NOTE — Patient Instructions (Addendum)
  VISIT SUMMARY: You visited us  today due to acute back pain following a lifting injury. We discussed your chronic back pain, recent treatments, and made adjustments to your medication plan to better manage your pain and avoid side effects.  YOUR PLAN: CHRONIC LOW BACK PAIN WITH LUMBAR RADICULOPATHY: You have chronic low back pain that worsened recently due to a lifting injury. Your pain persists despite initial treatments. -Stop taking tizanidine due to drowsiness. -Refilled tramadol  for six months. -Continue stretching and exercises. -Use ice and heat as needed. -Apply Voltaren gel up to four times daily. -Avoid ibuprofen  or Aleve; use Tylenol  up to 3000 mg daily. -Consider an MRI if there is no improvement in a couple of weeks. -Schedule an appointment with Doctor Abigail for an injection - we will appeal the denial if needed. -Follow up for TPI; will not do GTB injections while ESI is pending  FIBROMYALGIA: You have fibromyalgia, which is being managed with medication. -Continue taking gabapentin  as prescribed. - Tramadol  refilled  - TPI PRN  STATUS POST GASTRIC BYPASS SURGERY WITH NSAID PRECAUTIONS: You have had gastric bypass surgery, which requires caution with NSAID use due to gastrointestinal risks. -Avoid regular NSAID use; use Tylenol  for pain management. -Monitor for gastrointestinal symptoms if NSAIDs are used occasionally.     Contains text generated by [employer].

## 2023-12-22 NOTE — Progress Notes (Signed)
 Subjective:    Patient ID: Kelsey Simmons, female    DOB: 1960-07-12, 63 y.o.   MRN: [id number]  HPI   Kelsey Simmons is a 63 y.o. year old female  who  has a past medical history of Allergy, Anxiety, Arthritis, Bilateral sacroiliitis, Chronic back pain, Depression (2020), Diabetes mellitus without complication (HCC), Fibromyalgia, Headache(784.0), History of kidney stones (2020), Hypercholesterolemia, Hypertension, Lumbar spondylosis, Lumbosacral neuritis, Myofascial pain, Paresthesia of lower extremity, Sleep apnea, Substance abuse (HCC), and Trochanteric bursitis.   They are presenting to PM&R clinic for follow up related to chronic neck, low back pain w/ lumbosacral radiculopathy, and fibromylgia pain .    Plan from last visit:  Fibromyalgia Cervicalgia Encounter for therapeutic drug monitoring Encounter for long-term opiate analgesic use Patient is stable on current regimen of tramadol , will continue medication and refill for 6 months.  Follow-up in 6 months for chronic pain management with myself or Fidela.   Come back in 2 weeks for trigger point injections into the neck and shoulders.  In the meantime, use topical Voltaren gel up to 4 times daily, alternating with heat and massage to help with muscle tension.   Chronic bilateral low back pain with sciatica, sciatica laterality unspecified Chronic right sacroiliac joint pain   On evaluation today, the pain in your low back and lateral thighs could certainly be from ongoing S1 neuropathy, but there is also some indication of gluteal/hamstring tendinitis and right sacroiliitis.    Since S1 injections with Dr. Carilyn worked well in the past, I will send you to him again for these.     If these injections are not as effective as last time, let me know and we will get you started with PT for hip and low back strengthening (will look into who can see you after 5 pm due to work restrictions), and get you a right SI joint injection at  least 6 weeks after your epidural steroid injection.     Interval Hx:  Discussed the use of AI scribe software for clinical note transcription with the patient, who gave verbal consent to proceed.  History of Present Illness   Kelsey Simmons is a 63 year old female with a history of chronic back problems who presents with acute back pain following a lifting injury.  Approximately two weeks ago she developed acute back pain after lifting a heavy object over her bed frame. The pain began midweek and persisted, leading her to urgent care the following Monday. It has improved slightly but remains significant.  At urgent care she was given a steroid pack and tizanidine. The steroid did not help. Tizanidine caused marked drowsiness without pain relief, to the point that she was falling asleep while holding her phone.  She has chronic back pain and uses two Tylenol  with three Aleve for relief, which she feels is the only effective regimen. She continues Aleve despite prior gastric bypass surgery. She has no new or worsening leg numbness, tingling, weakness, stumbles, or falls. Home back stretches and exercises increased her pain.         Pain Inventory Average Pain 5 Pain Right Now 8 My pain is sharp and stabbing  In the last 24 hours, has pain interfered with the following? General activity 9 Relation with others 2 Enjoyment of life 8 What TIME of day is your pain at its worst? morning , daytime, evening, and night Sleep (in general) Fair  Pain is worse with: walking, bending, sitting, standing,  and some activites Pain improves with: rest, heat/ice, medication, and injections Relief from Meds: 7  Family History  Problem Relation Age of Onset   Diabetes Mellitus II Mother    Lupus Mother    Arthritis Mother    Diabetes Mother    Hypertension Mother    Miscarriages / Stillbirths Mother    Lung cancer Father        smoker/worked in factory   Alcohol abuse Father    Cancer Father         smoker, worked in marine scientist   Early death Father    Hypertension Father    Diabetes Mellitus II Sister    Arthritis Sister    Diabetes Sister    Hypertension Sister    Obesity Sister    Diabetes Mellitus II Brother    Hypercholesterolemia Brother    Diabetes Brother    Cancer Maternal Grandmother        Either uterine or endometrial   Colon cancer Neg Hx    Esophageal cancer Neg Hx    Liver disease Neg Hx    Stomach cancer Neg Hx    Rectal cancer Neg Hx    Social History   Socioeconomic History   Marital status: Married    Spouse name: Lucious   Number of children: 2   Years of education: Not on file   Highest education level: Some college, no degree  Occupational History   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  Tobacco Use   Smoking status: Never    Passive exposure: Never   Smokeless tobacco: Never  Vaping Use   Vaping status: Never Used  Substance and Sexual Activity   Alcohol use: Not Currently    Comment: quit 1994   Drug use: Not Currently    Types: Crack cocaine    Comment: quit 1994   Sexual activity: Not Currently    Birth control/protection: Post-menopausal  Other Topics Concern   Not on file  Social History Narrative   Lives with husband   Caffeine- 11 oz c daily   Social Drivers of Health   Financial Resource Strain: Low Risk  (01/21/2023)   Overall Financial Resource Strain (CARDIA)    Difficulty of Paying Living Expenses: Not hard at all  Food Insecurity: No Food Insecurity (01/21/2023)   Hunger Vital Sign    Worried About Running Out of Food in the Last Year: Never true    Ran Out of Food in the Last Year: Never true  Transportation Needs: No Transportation Needs (01/21/2023)   PRAPARE - Administrator, Civil Service (Medical): No    Lack of Transportation (Non-Medical): No  Physical Activity: Unknown (01/21/2023)   Exercise Vital Sign    Days of Exercise per Week: 0 days    Minutes of Exercise per Session: Not on file   Recent Concern: Physical Activity - Medium Risk (01/11/2023)   Received from [facility]   Physical Activity    Moderate Physical Activity: Not on file    Vigorous Physical Activity: Never    Time Spent Sitting: More than 8 hours  Stress: Stress Concern Present (01/21/2023)   [employer] of Occupational Health - Occupational Stress Questionnaire    Feeling of Stress : To some extent  Social Connections: Moderately Isolated (01/21/2023)   Social Connection and Isolation Panel    Frequency of Communication with Friends and Family: More than three times a week    Frequency of Social Gatherings with Friends and Family: More than  three times a week    Attends Religious Services: Never    Active Member of Clubs or Organizations: No    Attends Engineer, Structural: Not on file    Marital Status: Married   Past Surgical History:  Procedure Laterality Date   ABDOMINAL HYSTERECTOMY  01/22/1995   BUNIONECTOMY  01/21/2010   CESAREAN SECTION     COLONOSCOPY     GASTRIC BYPASS  01/22/2004   JOINT REPLACEMENT     RTKA 7'15   OVARY SURGERY     ROBOTIC ASSISTED LAPAROSCOPIC LYSIS OF ADHESION N/A 04/19/2019   Procedure: XI ROBOTIC ASSISTED LAPAROSCOPIC LYSIS OF ADHESION;  Surgeon: Viktoria Comer SAUNDERS, MD;  Location: [facility];  Service: Gynecology;  Laterality: N/A;   ROBOTIC ASSISTED SALPINGO OOPHERECTOMY Left 04/19/2019   Procedure: XI ROBOTIC ASSISTED SALPINGO OOPHORECTOMY;  Surgeon: Viktoria Comer SAUNDERS, MD;  Location: [facility];  Service: Gynecology;  Laterality: Left;   TOTAL KNEE ARTHROPLASTY Right 08/18/2013   Procedure: RIGHT TOTAL KNEE ARTHROPLASTY;  Surgeon: Tanda DELENA Heading, MD;  Location: [facility];  Service: Orthopedics;  Laterality: Right;   TOTAL KNEE ARTHROPLASTY Left 04/13/2014   Procedure: LEFT TOTAL KNEE ARTHROPLASTY;  Surgeon: Tanda Heading, MD;  Location: [facility];  Service: Orthopedics;  Laterality: Left;   VENTRAL HERNIA REPAIR N/A 01/11/2019   Procedure:  LAPAROSCOPIC VENTRAL HERNIA REPAIR WITH MESH;  Surgeon: Paola Dreama SAILOR, MD;  Location: [facility];  Service: General;  Laterality: N/A;   Past Surgical History:  Procedure Laterality Date   ABDOMINAL HYSTERECTOMY  01/22/1995   BUNIONECTOMY  01/21/2010   CESAREAN SECTION     COLONOSCOPY     GASTRIC BYPASS  01/22/2004   JOINT REPLACEMENT     RTKA 7'15   OVARY SURGERY     ROBOTIC ASSISTED LAPAROSCOPIC LYSIS OF ADHESION N/A 04/19/2019   Procedure: XI ROBOTIC ASSISTED LAPAROSCOPIC LYSIS OF ADHESION;  Surgeon: Viktoria Comer SAUNDERS, MD;  Location: [facility];  Service: Gynecology;  Laterality: N/A;   ROBOTIC ASSISTED SALPINGO OOPHERECTOMY Left 04/19/2019   Procedure: XI ROBOTIC ASSISTED SALPINGO OOPHORECTOMY;  Surgeon: Viktoria Comer SAUNDERS, MD;  Location: [facility];  Service: Gynecology;  Laterality: Left;   TOTAL KNEE ARTHROPLASTY Right 08/18/2013   Procedure: RIGHT TOTAL KNEE ARTHROPLASTY;  Surgeon: Tanda DELENA Heading, MD;  Location: [facility];  Service: Orthopedics;  Laterality: Right;   TOTAL KNEE ARTHROPLASTY Left 04/13/2014   Procedure: LEFT TOTAL KNEE ARTHROPLASTY;  Surgeon: Tanda Heading, MD;  Location: [facility];  Service: Orthopedics;  Laterality: Left;   VENTRAL HERNIA REPAIR N/A 01/11/2019   Procedure: LAPAROSCOPIC VENTRAL HERNIA REPAIR WITH MESH;  Surgeon: Paola Dreama SAILOR, MD;  Location: [facility];  Service: General;  Laterality: N/A;   Past Medical History:  Diagnosis Date   Allergy    Anxiety    Arthritis    Bilateral sacroiliitis    Chronic back pain    Depression 2020   in therapy but not on medications   Diabetes mellitus without complication (HCC)    type 2    Fibromyalgia    Headache(784.0)    HX MIGRAINES   History of kidney stones 2020   left parenchymal stone identified on CT 11/23/18   kidney cyst   Hypercholesterolemia    Hypertension    Lumbar spondylosis    Lumbosacral neuritis    Myofascial pain    Paresthesia of lower extremity    Sleep apnea    HAS NOT USED C PAP SINCE  2007 DUE TO WT LOSS  Substance abuse (HCC)    Trochanteric bursitis    left hip   BP 101/62   Pulse 89   Ht 5' 1 (1.549 m)   Wt 184 lb (83.5 kg)   SpO2 99%   BMI 34.77 kg/m   Opioid Risk Score:   Fall Risk Score:  `1  Depression screen [facility] 2/9     08/05/2023    2:16 PM 07/28/2023    3:32 PM 06/11/2023    3:39 PM 05/28/2023    3:35 PM 04/14/2023    2:21 PM 02/26/2023    8:31 AM 12/09/2022    3:14 PM  Depression screen PHQ 2/9  Decreased Interest 0 1 1 0 0 1 0  Down, Depressed, Hopeless 0 1 1 0 1 1 0  PHQ - 2 Score 0 2 2 0 1 2 0  Altered sleeping    0 2 1   Tired, decreased energy    0 1 1   Change in appetite    0 2 1   Feeling bad or failure about yourself     0 1 2   Trouble concentrating    0 1 0   Moving slowly or fidgety/restless    0 3 0   Suicidal thoughts    0 0 0   PHQ-9 Score    0  11  7    Difficult doing work/chores    Not difficult at all Somewhat difficult Somewhat difficult      Data saved with a previous flowsheet row definition    Review of Systems  Musculoskeletal:  Positive for back pain and gait problem.  All other systems reviewed and are negative.      Objective:   Physical Exam    PE: Constitution: Appropriate appearance for age. No apparent distress  +Obese Resp: No respiratory distress. No accessory muscle usage. on RA and CTAB Cardio: Well perfused appearance. Tr peripheral edema. Abdomen: Nondistended. Nontender.   Psych: Appropriate mood and affect. Neuro: AAOx4. No apparent cognitive deficits    Neurologic Exam:    Sensory exam: revealed normal sensation in all dermatomal regions in bilateral upper extremities, bilateral lower extremities, and with reduced sensation to light touch in bilateral lateral thighs Motor exam: strength 4-/5 proximally and 5-/5 distally throughout  Coordination: Fine motor coordination was normal.   Gait: Stable with RW, forward flexed, reduced stride     MSK: Back Exam:   Inspection: Pelvis was   even.  Lumbar lordotic curvature was increased.  There was  no evidence of scoliosis.  Palpation: Palpatory exam revealed ttp at the b/l PSIS, SI joints, GTBS, lateral thighs, and lumbar paraspinals . There was  no evidence of spasm.    Special/provocative testing:    Facet loading: + (very non-specific)   TTP at paraspinals: +(sensitive for facet pain...if no ttp then likely not facet pain)         Assessment & Plan:   Kelsey Simmons is a 63 y.o. year old female  who  has a past medical history of Allergy, Anxiety, Arthritis, Bilateral sacroiliitis, Chronic back pain, Depression (2020), Diabetes mellitus without complication (HCC), Fibromyalgia, Headache(784.0), History of kidney stones (2020), Hypercholesterolemia, Hypertension, Lumbar spondylosis, Lumbosacral neuritis, Myofascial pain, Paresthesia of lower extremity, Sleep apnea, Substance abuse (HCC), and Trochanteric bursitis.   They are presenting to PM&R clinic for follow up related to chronic neck, low back pain w/ lumbosacral radiculopathy, and fibromylgia pain .     Assessment and Plan  Chronic low back pain with lumbar radiculopathy Recent exacerbation from lifting injury. Pain persists despite treatment. Improvement noted but incomplete. X-ray normal, MRI considered if no improvement. NSAID use discussed due to gastric bypass history. - Stopped tizanidine due to drowsiness. - Refilled tramadol  for six months. - Continue stretching and exercises. - Use ice and heat as needed. - Apply Voltaren gel up to four times daily. - Avoid ibuprofen  or Aleve; use Tylenol  up to 3000 mg daily. - Consider MRI if no improvement in a couple of weeks. - She had >85% pain reduction for 6 months afte her last injection S1 ESI with Dr. Carilyn - will appeal denial for this - Consider trigger point injections after six weeks from last steroid injection.  Fibromyalgia - Continue gabapentin  as prescribed.  Status post gastric bypass surgery  with NSAID precautions Advised against regular NSAID use due to gastrointestinal risk. Occasional NSAID use discussed with caution. - Avoid regular NSAID use; use Tylenol  for pain management. - Monitor for gastrointestinal symptoms if NSAIDs are used occasionally.

## 2024-01-06 ENCOUNTER — Ambulatory Visit: Payer: Self-pay

## 2024-01-06 NOTE — Telephone Encounter (Signed)
 FYI Only or Action Required?: FYI only for provider: appointment scheduled on 01/07/24 at [place].  Patient was last seen in primary care on 05/28/2023 by de [place], Quintin PARAS, MD.  Called Nurse Triage reporting Back Pain.  Symptoms began a week ago.  Interventions attempted: OTC medications: tylenol  and ibuprofen .  Symptoms are: unchanged.  Triage Disposition: See PCP When Office is Open (Within 3 Days)  Patient/caregiver understands and will follow disposition?: Yes  Copied from CRM [id number]. Topic: Clinical - Red Word Triage >> Jan 06, 2024  1:09 PM Amy B wrote: Red Word that prompted transfer to Nurse Triage: Sciatic pain right hip radiating to lower back, unable to walk Reason for Disposition  [1] Pain radiates into the thigh or further down the leg AND [2] one leg  Answer Assessment - Initial Assessment Questions Has used tizanidine in the past, but just make her sleep.  Had an injection of toradol  months ago that helped  1. ONSET: When did the pain begin? (e.g., minutes, hours, days)     One week ago 2. LOCATION: Where does it hurt? (upper, mid or lower back)     Low back  3. SEVERITY: How bad is the pain?  (e.g., Scale 1-10; mild, moderate, or severe)     Causes difficulty walking 4. PATTERN: Is the pain constant? (e.g., yes, no; constant, intermittent)      intermittent 5. RADIATION: Does the pain shoot into your legs or somewhere else?     Right hip and right thigh 6. CAUSE:  What do you think is causing the back pain?      sciatic 7. BACK OVERUSE:  Any recent lifting of heavy objects, strenuous work or exercise?     Attempted to lift adjustable base of bed about a month ago 8. MEDICINES: What have you taken so far for the pain? (e.g., nothing, acetaminophen , NSAIDS)     Tylenol  and ibuprofen , eases pain minimally  Protocols used: Back Pain-A-AH

## 2024-01-07 ENCOUNTER — Ambulatory Visit: Admitting: Family Medicine

## 2024-01-07 VITALS — BP 114/76 | HR 104 | Temp 97.6°F | Wt 189.0 lb

## 2024-01-07 DIAGNOSIS — M5416 Radiculopathy, lumbar region: Secondary | ICD-10-CM

## 2024-01-07 DIAGNOSIS — M5430 Sciatica, unspecified side: Secondary | ICD-10-CM | POA: Diagnosis not present

## 2024-01-07 MED ORDER — METHYLPREDNISOLONE ACETATE 80 MG/ML IJ SUSP
80.0000 mg | Freq: Once | INTRAMUSCULAR | Status: AC
  Administered 2024-01-07: 18:00:00 80 mg via INTRAMUSCULAR

## 2024-01-07 MED ORDER — SCOPOLAMINE 1 MG/3DAYS TD PT72: 1.0000 | MEDICATED_PATCH | TRANSDERMAL | 0 refills | Status: AC

## 2024-01-07 MED ORDER — METHOCARBAMOL 500 MG PO TABS: 500.0000 mg | ORAL_TABLET | Freq: Four times a day (QID) | ORAL | 1 refills | Status: AC | PRN

## 2024-01-07 NOTE — Progress Notes (Signed)
 Established Patient Office Visit  Subjective   Patient ID: Kelsey Simmons, female    DOB: 02/28/1960  Age: 63 y.o. MRN: [id number]  Chief Complaint  Patient presents with   Back Pain    HPI   Ms. Tison is seen as a work in today with possible sciatica type symptoms on the right side.  She has had some past history of back pain but no previous back surgeries.  She states on 11/17 she was lifting portion of her bed when she noticed some mid lumbar back pain.  She has had somewhat similar pain in the past.  Her current sciatica type symptoms started about a week ago.  She describes sharp pain which radiates into the buttock down about mid thigh and occasionally to the calf.  No numbness or weakness.  No urine or stool incontinence.  She also has chronic back pain and is followed by pain management and apparently treated with tramadol .  She had similar flareup with sciatica type symptoms 2024 which did respond to intramuscular steroid.  Patient relates she went to urgent care back in November and had oral steroids and tizanidine but did not see much response.  She had extreme sedation with the tizanidine.  She has tolerated Robaxin  better in the past.  She feels like she is having some secondary muscle spasm in her lumbar area  She had a sister are getting ready to travel and couple weeks to [place].  She is requesting scopolamine  patch to prevent motion sickness.  She does not have any history of glaucoma.  No history of small bowel obstruction.  Past Medical History:  Diagnosis Date   Allergy    Anxiety    Arthritis    Bilateral sacroiliitis    Chronic back pain    Depression 2020   in therapy but not on medications   Diabetes mellitus without complication (HCC)    type 2    Fibromyalgia    Headache(784.0)    HX MIGRAINES   History of kidney stones 2020   left parenchymal stone identified on CT 11/23/18   kidney cyst   Hypercholesterolemia    Hypertension    Lumbar  spondylosis    Lumbosacral neuritis    Myofascial pain    Paresthesia of lower extremity    Sleep apnea    HAS NOT USED C PAP SINCE 2007 DUE TO WT LOSS    Substance abuse (HCC)    Trochanteric bursitis    left hip   Past Surgical History:  Procedure Laterality Date   ABDOMINAL HYSTERECTOMY  01/22/1995   BUNIONECTOMY  01/21/2010   CESAREAN SECTION     COLONOSCOPY     GASTRIC BYPASS  01/22/2004   JOINT REPLACEMENT     RTKA 7'15   OVARY SURGERY     ROBOTIC ASSISTED LAPAROSCOPIC LYSIS OF ADHESION N/A 04/19/2019   Procedure: XI ROBOTIC ASSISTED LAPAROSCOPIC LYSIS OF ADHESION;  Surgeon: Viktoria Comer JONELLE, MD;  Location: [facility];  Service: Gynecology;  Laterality: N/A;   ROBOTIC ASSISTED SALPINGO OOPHERECTOMY Left 04/19/2019   Procedure: XI ROBOTIC ASSISTED SALPINGO OOPHORECTOMY;  Surgeon: Viktoria Comer JONELLE, MD;  Location: [facility];  Service: Gynecology;  Laterality: Left;   TOTAL KNEE ARTHROPLASTY Right 08/18/2013   Procedure: RIGHT TOTAL KNEE ARTHROPLASTY;  Surgeon: Tanda DELENA Heading, MD;  Location: [facility];  Service: Orthopedics;  Laterality: Right;   TOTAL KNEE ARTHROPLASTY Left 04/13/2014   Procedure: LEFT TOTAL KNEE ARTHROPLASTY;  Surgeon: Tanda Heading, MD;  Location: [facility];  Service: Orthopedics;  Laterality: Left;   VENTRAL HERNIA REPAIR N/A 01/11/2019   Procedure: LAPAROSCOPIC VENTRAL HERNIA REPAIR WITH MESH;  Surgeon: Paola Dreama SAILOR, MD;  Location: [facility];  Service: General;  Laterality: N/A;    reports that she has never smoked. She has never been exposed to tobacco smoke. She has never used smokeless tobacco. She reports that she does not currently use alcohol. She reports that she does not currently use drugs after having used the following drugs: Crack cocaine. family history includes Alcohol abuse in her father; Arthritis in her mother and sister; Cancer in her father and maternal grandmother; Diabetes in her brother, mother, and sister; Diabetes Mellitus II in her  brother, mother, and sister; Early death in her father; Hypercholesterolemia in her brother; Hypertension in her father, mother, and sister; Lung cancer in her father; Lupus in her mother; Miscarriages / Stillbirths in her mother; Obesity in her sister. Allergies[1]  Review of Systems  Constitutional:  Negative for chills and fever.  Cardiovascular:  Negative for chest pain.  Musculoskeletal:  Positive for back pain.  Neurological:  Negative for sensory change and focal weakness.      Objective:     BP 114/76   Pulse (!) 104   Temp 97.6 F (36.4 C) (Oral)   Wt 189 lb (85.7 kg)   SpO2 98%   BMI 35.71 kg/m  BP Readings from Last 3 Encounters:  01/07/24 114/76  12/22/23 101/62  08/05/23 117/64   Wt Readings from Last 3 Encounters:  01/07/24 189 lb (85.7 kg)  12/22/23 184 lb (83.5 kg)  08/05/23 198 lb 12.8 oz (90.2 kg)      Physical Exam Vitals reviewed.  Constitutional:      General: She is not in acute distress.    Appearance: She is not ill-appearing.  Cardiovascular:     Rate and Rhythm: Normal rate and regular rhythm.  Musculoskeletal:     Comments: Straight leg raises are negative bilaterally.  No localized spinal tenderness.  Neurological:     Mental Status: She is alert.     Comments: Has full strength with plantarflexion, dorsiflexion, and knee extension bilaterally.  She has 2+ reflexes knee and ankle bilaterally.  Normal sensory impairment to touch.      No results found for any visits on 01/07/24.    The 10-year ASCVD risk score (Arnett DK, et al., 2019) is: 8.4%    Assessment & Plan:   Problem List Items Addressed This Visit   None Visit Diagnoses       Sciatica, unspecified laterality    -  Primary   Relevant Medications   methocarbamol  (ROBAXIN ) 500 MG tablet   methylPREDNISolone  acetate (DEPO-MEDROL ) injection 80 mg (Completed)     1 week history of right sided sciatica symptoms.  She has had some intermittent lumbar pain since  12-08-2023 after lifting bedpost at home.  None focal neuroexam currently.  She has background of chronic back pain followed by pain management on tramadol .  -Recommend Depo-Medrol  80 mg IM - Robaxin  500 mg every 6 hours as needed for muscle spasm. - Follow-up immediately for any progressive weakness, numbness, or progressive pain. - Follow-up with primary if not improving over the next week - Patient requesting scopolamine  patch for upcoming travel.  No known contraindications.  Wrote for #4 patches 1 patch behind the ear every 72 hours as needed for prevention of motion sickness.  No follow-ups on file.    Wolm Scarlet,  MD     [1]  Allergies Allergen Reactions   Topiramate Other (See Comments)    Other reaction(s): double vision   Chlorhexidine  Gluconate Rash   Tape Rash    DERMABOND

## 2024-01-10 ENCOUNTER — Other Ambulatory Visit: Payer: Self-pay

## 2024-01-10 ENCOUNTER — Encounter (HOSPITAL_COMMUNITY): Payer: Self-pay | Admitting: *Deleted

## 2024-01-10 ENCOUNTER — Emergency Department (HOSPITAL_COMMUNITY)
Admission: EM | Admit: 2024-01-10 | Discharge: 2024-01-11 | Disposition: A | Attending: Emergency Medicine | Admitting: Emergency Medicine

## 2024-01-10 ENCOUNTER — Emergency Department (HOSPITAL_COMMUNITY)

## 2024-01-10 ENCOUNTER — Ambulatory Visit (HOSPITAL_COMMUNITY)

## 2024-01-10 DIAGNOSIS — E119 Type 2 diabetes mellitus without complications: Secondary | ICD-10-CM | POA: Diagnosis not present

## 2024-01-10 DIAGNOSIS — M62838 Other muscle spasm: Secondary | ICD-10-CM | POA: Diagnosis not present

## 2024-01-10 DIAGNOSIS — M79604 Pain in right leg: Secondary | ICD-10-CM | POA: Diagnosis present

## 2024-01-10 DIAGNOSIS — Z96651 Presence of right artificial knee joint: Secondary | ICD-10-CM | POA: Diagnosis not present

## 2024-01-10 DIAGNOSIS — R2241 Localized swelling, mass and lump, right lower limb: Secondary | ICD-10-CM | POA: Insufficient documentation

## 2024-01-10 LAB — D-DIMER, QUANTITATIVE: D-Dimer, Quant: 0.38 ug{FEU}/mL (ref 0.00–0.50)

## 2024-01-10 MED ORDER — DIAZEPAM 5 MG PO TABS
5.0000 mg | ORAL_TABLET | Freq: Once | ORAL | Status: AC
  Administered 2024-01-10: 5 mg via ORAL
  Filled 2024-01-10: qty 1

## 2024-01-10 NOTE — Discharge Instructions (Addendum)
 Your workup this evening was reassuring with no signs of blood clot.  I recommend following up with your orthopedic and primary care teams for further management of your muscle spasms/cramps as needed.  You may continue to take your previously prescribed Robaxin .  Return to the emergency department if you develop any life-threatening symptoms.

## 2024-01-10 NOTE — ED Provider Triage Note (Signed)
 Emergency Medicine Provider Triage Evaluation Note  Kelsey Simmons , a 63 y.o. female  was evaluated in triage.  Pt complains of radicular pain. Endorse pain to R lumbosacral region that radiates down R leg for 2 weeks.  This started after she did some heavy lifting.  Have been seen by ortho and was prescribed prednisone  and muscle relaxant 3 days ago but without relief.  No fall or injury, no bowel/bladder incontinence or saddle anesthesia.  Has pain contract  Review of Systems  Positive: As above Negative: As above  Physical Exam  BP 122/72   Pulse 97   Temp 98 F (36.7 C)   Resp 18   Ht 5' 1 (1.549 m)   Wt 85.7 kg   SpO2 100%   BMI 35.70 kg/m  Gen:   Awake, no distress   Resp:  Normal effort  MSK:   Moves extremities without difficulty  Other:    Medical Decision Making  Medically screening exam initiated at 6:15 PM.  Appropriate orders placed.  Kelsey Simmons was informed that the remainder of the evaluation will be completed by another provider, this initial triage assessment does not replace that evaluation, and the importance of remaining in the ED until their evaluation is complete.     Nivia Colon, PA-C 01/10/24 1816

## 2024-01-10 NOTE — ED Triage Notes (Signed)
 Gastric bypass years ago some meds she cannot take because of this

## 2024-01-10 NOTE — ED Triage Notes (Signed)
 She has been seen by ortho after this last episode of back pain

## 2024-01-10 NOTE — ED Provider Notes (Signed)
 " [place] EMERGENCY DEPARTMENT AT [facility] Provider Note   CSN: [id number] Arrival date & time: 01/10/24  1721     Patient presents with: Leg Pain and Back Pain   Kelsey Simmons is a 63 y.o. female.  Patient with past medical history significant for right TKA, chronic pain on tramadol  and gabapentin  with pain management agreement, GERD, fibromyalgia, type II DM presents to the emergency department complaining of right sided leg pain.  Patient states that a few weeks ago she was moving a piece of furniture when she felt that she hurt her lower back.  She was seen by orthopedics and primary care and thought to possibly have sciatica.  Today she is presenting due to muscle spasms in the right thigh.  She has pain in the right buttock and right quadricep region.  She denies any specific injury to the area.  The pain comes in spasms.  She denies burning pain, denies radiation down the leg to the leg.  The current pain does not seem to originate in the low back.    Leg Pain Associated symptoms: back pain   Back Pain Associated symptoms: leg pain        Prior to Admission medications  Medication Sig Start Date End Date Taking? Authorizing Provider  albuterol  (VENTOLIN  HFA) 108 (90 Base) MCG/ACT inhaler Inhale 2 puffs into the lungs every 6 (six) hours as needed for wheezing or shortness of breath. 10/30/22   Kassie Acquanetta Bradley, MD  amLODipine  (NORVASC ) 5 MG tablet Take 0.5 tablets (2.5 mg total) by mouth daily. 02/26/23   Caudle, Thersia Bitters, FNP  atorvastatin  (LIPITOR) 20 MG tablet Take 1 tablet (20 mg total) by mouth daily. 02/26/23   Caudle, Thersia Bitters, FNP  azelastine  (ASTELIN ) 0.1 % nasal spray Place 2 sprays into both nostrils 2 (two) times daily. Use in each nostril as directed 07/22/23   Tobie Eldora NOVAK, MD  Blood Glucose Monitoring Suppl DEVI 1 each by Does not apply route in the morning, at noon, and at bedtime. One Touch Verio Flex per patients insurance 09/25/22   de [place],  Quintin PARAS, MD  buPROPion  (WELLBUTRIN  XL) 300 MG 24 hr tablet Take 1 tablet (300 mg total) by mouth every morning. 11/19/23 02/17/24  Arfeen, Leni DASEN, MD  gabapentin  (NEURONTIN ) 600 MG tablet Take 1 tablet (600 mg total) by mouth 3 (three) times daily. 02/26/23   Caudle, Thersia Bitters, FNP  hydrochlorothiazide  (HYDRODIURIL ) 25 MG tablet Take 1 tablet (25 mg total) by mouth daily. 08/21/23   de [place], Quintin PARAS, MD  ipratropium (ATROVENT ) 0.03 % nasal spray Place 2 sprays into both nostrils 2 (two) times daily. 10/30/22   Kassie Acquanetta Bradley, MD  lamoTRIgine  (LAMICTAL ) 100 MG tablet Take 1 tablet (100 mg total) by mouth daily. 11/19/23   Arfeen, Leni DASEN, MD  Lancets (ONETOUCH DELICA PLUS LANCET33G) MISC USE 1 LANCET IN THE [place], AT NOON AND AT BEDTIME 01/07/23   de [place], Quintin PARAS, MD  methocarbamol  (ROBAXIN ) 500 MG tablet Take 1 tablet (500 mg total) by mouth every 6 (six) hours as needed for muscle spasms. 01/07/24   Burchette, Wolm ORN, MD  methocarbamol  (ROBAXIN -750) 750 MG tablet Take 1 tablet (750 mg total) by mouth every 8 (eight) hours as needed for muscle spasms. 09/04/22   Emeline Joesph BROCKS, DO  MOUNJARO  5 MG/0.5ML Pen INJECT 5 MG UNDER THE SKIN WEEKLY 11/04/23   de [place], Quintin PARAS, MD  [facility] VERIO test strip USE  1 STRIP IN THE MORNING, AT [facility] AND AT BEDTIME 08/14/23   de [place], Quintin PARAS, MD  QULIPTA  60 MG TABS TAKE 1 TABLET BY MOUTH EVERY DAY 10/22/23   de [place], Quintin PARAS, MD  scopolamine  (TRANSDERM-SCOP) 1 MG/3DAYS Place 1 patch (1 mg total) onto the skin every 3 (three) days. 01/07/24   Burchette, Wolm ORN, MD  traMADol  (ULTRAM ) 50 MG tablet Take 1 tablet (50 mg total) by mouth every 8 (eight) hours as needed. 12/22/23   Emeline Joesph BROCKS, DO    Allergies: Topiramate, Chlorhexidine  gluconate, and Tape    Review of Systems  Musculoskeletal:  Positive for back pain.    Updated Vital Signs BP (!) 114/59   Pulse 84   Temp 98 F (36.7 C)   Resp 15   Ht 5' 1 (1.549 m)   Wt 85.7 kg   SpO2  100%   BMI 35.70 kg/m   Physical Exam Vitals and nursing note reviewed.  HENT:     Head: Normocephalic and atraumatic.  Eyes:     Pupils: Pupils are equal, round, and reactive to light.  Pulmonary:     Effort: Pulmonary effort is normal. No respiratory distress.  Musculoskeletal:        General: No signs of injury.     Cervical back: Normal range of motion.     Comments: Straight leg raises are negative bilaterally.  No localized spinal tenderness. Tender knot in right buttock and right quadricep.   Skin:    General: Skin is dry.  Neurological:     Mental Status: She is alert.  Psychiatric:        Speech: Speech normal.        Behavior: Behavior normal.     (all labs ordered are listed, but only abnormal results are displayed) Labs Reviewed  D-DIMER, QUANTITATIVE    EKG: None  Radiology: DG Hip Unilat W or Wo Pelvis 2-3 Views Right Result Date: 01/10/2024 CLINICAL DATA:  Pain after lifting EXAM: DG HIP (WITH OR WITHOUT PELVIS) 2-3V RIGHT COMPARISON:  None Available. FINDINGS: Pubic symphysis and rami appear intact. The SI joints are non widened. No fracture or malalignment. Mild right hip degenerative change IMPRESSION: Mild right hip degenerative change. Electronically Signed   By: Luke Bun M.D.   On: 01/10/2024 19:13     Procedures   Medications Ordered in the ED  diazepam  (VALIUM ) tablet 5 mg (5 mg Oral Given 01/10/24 2257)                                    Medical Decision Making Amount and/or Complexity of Data Reviewed Labs: ordered.  Risk Prescription drug management.   This patient presents to the ED for concern of right leg pain, this involves an extensive number of treatment options, and is a complaint that carries with it a high risk of complications and morbidity.  The differential diagnosis includes fracture, dislocation, muscle cramp, soft tissue injury, sciatica, DVT, others   Co morbidities / Chronic conditions that complicate the  patient evaluation  As noted in HPI   Additional history obtained:  Additional history obtained from EMR External records from outside source obtained and reviewed including family medicine notes   Lab Tests:  I Ordered, and personally interpreted labs.  The pertinent results include: Negative D-dimer   Imaging Studies ordered:  I ordered imaging studies including plain films of the right  hip I independently visualized and interpreted imaging which showed mild arthritis I agree with the radiologist interpretation    Problem List / ED Course / Critical interventions / Medication management   I ordered medication including Valium  Reevaluation of the patient after these medicines showed that the patient improved   Test / Admission - Considered:  Patient's presentation does not seem consistent at this time with sciatica.  There is no radiation of symptoms pain seems to come and spasms and she can feel muscle knots in the painful areas.  Her hip x-ray was negative.  No sign of fracture or dislocation.  No known injury.  Patient treated for spasms with a dose of Valium .  D-dimer was negative showing no clot burden.  Plan to have patient follow-up with her orthopedic and primary care team for further evaluation as needed.  Patient stable for discharge home.      Final diagnoses:  Muscle spasm of right leg    ED Discharge Orders     None          Logan Ubaldo KATHEE DEVONNA 01/10/24 2346  "

## 2024-01-10 NOTE — ED Notes (Signed)
Pt ambulatory to restroom with minimal assistance

## 2024-01-10 NOTE — ED Triage Notes (Signed)
 Patient reports sciatic pain attack starting after thanksgiving causing significant issues with ADLs. Patient is on pain contract d/t RA and other conditions as well as muscle relaxers and gabapentin  and reports these have not been able to get the pain to a tolerable level.

## 2024-01-17 ENCOUNTER — Other Ambulatory Visit (HOSPITAL_BASED_OUTPATIENT_CLINIC_OR_DEPARTMENT_OTHER): Payer: Self-pay | Admitting: Family Medicine

## 2024-01-17 DIAGNOSIS — G43109 Migraine with aura, not intractable, without status migrainosus: Secondary | ICD-10-CM

## 2024-02-03 ENCOUNTER — Encounter: Admitting: Physical Medicine & Rehabilitation

## 2024-02-04 ENCOUNTER — Ambulatory Visit (HOSPITAL_BASED_OUTPATIENT_CLINIC_OR_DEPARTMENT_OTHER): Payer: Self-pay | Admitting: Family Medicine

## 2024-02-04 ENCOUNTER — Encounter (HOSPITAL_BASED_OUTPATIENT_CLINIC_OR_DEPARTMENT_OTHER): Payer: Self-pay | Admitting: Family Medicine

## 2024-02-04 VITALS — BP 110/75 | HR 81 | Temp 97.8°F | Resp 18 | Ht 61.0 in | Wt 184.0 lb

## 2024-02-04 DIAGNOSIS — E119 Type 2 diabetes mellitus without complications: Secondary | ICD-10-CM

## 2024-02-04 DIAGNOSIS — I10 Essential (primary) hypertension: Secondary | ICD-10-CM

## 2024-02-04 DIAGNOSIS — Z7985 Long-term (current) use of injectable non-insulin antidiabetic drugs: Secondary | ICD-10-CM | POA: Diagnosis not present

## 2024-02-04 LAB — POCT GLYCOSYLATED HEMOGLOBIN (HGB A1C): Hemoglobin A1C: 5.5 % (ref 4.0–5.6)

## 2024-02-04 NOTE — Assessment & Plan Note (Signed)
 Hemoglobin A1c has been well-controlled in the past; due for recheck today.  She denies any new symptoms such as polyuria or polydipsia.  She continues with Mounjaro .  No reported GI issues with Mounjaro . A1c today in office - .  Can continue with current medication regimen, no changes today Check urine ACR today Foot exam at next office visit.

## 2024-02-04 NOTE — Assessment & Plan Note (Signed)
 Blood pressure controlled in office today.  We will continue with current medication regimen and plan for close monitoring.  Recommend intermittent monitoring at home, DASH diet

## 2024-02-04 NOTE — Progress Notes (Signed)
" ° ° °  Procedures performed today:    None.  Independent interpretation of notes and tests performed by another provider:   None.  Brief History, Exam, Impression, and Recommendations:    BP 110/75 (BP Location: Left Arm, Patient Position: Sitting, Cuff Size: Large)   Pulse 81   Temp 97.8 F (36.6 C) (Oral)   Resp 18   Ht 5' 1 (1.549 m)   Wt 184 lb (83.5 kg)   SpO2 100%   BMI 34.77 kg/m   Overdue for follow-up, last seen in May 2025 for diabetes.  Discussed the use of AI scribe software for clinical note transcription with the patient, who gave verbal consent to proceed.  History of Present Illness Kelsey Simmons is a 64 year old female who presents for routine follow-up and medication management.  Her A1c levels have fluctuated over the past five years, reaching as high as 6.7%, with the most recent level at 5.8%.  She recently refilled her Qulipta  medication and has other maintenance medications due for refill on the 23rd of this month. She experiences confusion with overlapping refills from different pharmacies.  She has received recent vaccinations, including flu, COVID, and hepatitis A, in preparation for an upcoming trip to [place] from January 21st to February 2nd. She previously received a tetanus shot and was informed she does not need another for ten years.  She is currently using Mounjaro  and requires a prescription for motion sickness medication for her trip. A previous doctor had planned to provide this prescription due to the proximity of her next appointment.  She is due for a urine test to check for protein, which is distinct from another test she undergoes due to tramadol  use.  Type 2 diabetes mellitus without complication, without long-term current use of insulin (HCC) Assessment & Plan: Hemoglobin A1c has been well-controlled in the past; due for recheck today.  She denies any new symptoms such as polyuria or polydipsia.  She continues with Mounjaro .  No  reported GI issues with Mounjaro . A1c today in office - .  Can continue with current medication regimen, no changes today Check urine ACR today Foot exam at next office visit.  Orders: -     Microalbumin / creatinine urine ratio -     POCT glycosylated hemoglobin (Hb A1C)  Primary hypertension Assessment & Plan: Blood pressure controlled in office today.  We will continue with current medication regimen and plan for close monitoring.  Recommend intermittent monitoring at home, DASH diet   Return in about 4 months (around 06/03/2024) for diabetes, hypertension.   ___________________________________________ Kelsey Cremer de [place], MD, ABFM, CAQSM Primary Care and Sports Medicine [facility] "

## 2024-02-05 LAB — MICROALBUMIN / CREATININE URINE RATIO
Creatinine, Urine: 116.9 mg/dL
Microalb/Creat Ratio: 5 mg/g{creat} (ref 0–29)
Microalbumin, Urine: 5.9 ug/mL

## 2024-02-09 ENCOUNTER — Other Ambulatory Visit: Payer: Self-pay | Admitting: Family Medicine

## 2024-02-09 DIAGNOSIS — Z1231 Encounter for screening mammogram for malignant neoplasm of breast: Secondary | ICD-10-CM

## 2024-02-13 ENCOUNTER — Other Ambulatory Visit (HOSPITAL_BASED_OUTPATIENT_CLINIC_OR_DEPARTMENT_OTHER): Payer: Self-pay | Admitting: Family Medicine

## 2024-02-17 ENCOUNTER — Ambulatory Visit (HOSPITAL_BASED_OUTPATIENT_CLINIC_OR_DEPARTMENT_OTHER): Payer: Self-pay | Admitting: Family Medicine

## 2024-02-23 ENCOUNTER — Telehealth (HOSPITAL_COMMUNITY): Admitting: Psychiatry

## 2024-02-24 ENCOUNTER — Encounter (HOSPITAL_BASED_OUTPATIENT_CLINIC_OR_DEPARTMENT_OTHER): Payer: Self-pay | Admitting: Family Medicine

## 2024-02-24 ENCOUNTER — Encounter

## 2024-02-24 DIAGNOSIS — Z1231 Encounter for screening mammogram for malignant neoplasm of breast: Secondary | ICD-10-CM

## 2024-02-25 ENCOUNTER — Telehealth (HOSPITAL_COMMUNITY): Admitting: Psychiatry

## 2024-02-26 NOTE — Progress Notes (Unsigned)
" °  PROCEDURE RECORD  Physical Medicine and Rehabilitation   Name: Kelsey Simmons DOB:[birth year] MRN: [id number]  Date:02/26/2024  Physician: Prentice Compton, MD    Nurse/CMA: Floria Brandau RN  Allergies: Allergies[1]  Consent Signed: {yes wn:685467}  Is patient diabetic? {yes no:314532}  CBG today? ***  Pregnant: {yes no:314532} LMP: No LMP recorded. Patient has had a hysterectomy. (age [age])  Anticoagulants: {Yes/No:19989} Anti-inflammatory: {Yes/No:19989} Antibiotics: {Yes/No:19989}  Procedure: Sacral 1 epidural steroid injection Position: Prone Start Time: ***  End Time: ***  Fluoro Time: ***  RN/CMA Designer, [employer]    Time *** ***    BP *** ***    Pulse *** ***    Respirations *** ***    O2 Sat *** ***    S/S *** ***    Pain Level *** ***     D/C home with ***, patient A & O X 3, D/C instructions reviewed, and sits independently.           [1]  Allergies Allergen Reactions   Topiramate Other (See Comments)    Other reaction(s): double vision   Chlorhexidine  Gluconate Rash   Tape Rash    DERMABOND   "

## 2024-02-27 ENCOUNTER — Encounter: Admitting: Physical Medicine & Rehabilitation

## 2024-02-27 ENCOUNTER — Encounter: Payer: Self-pay | Admitting: Physical Medicine & Rehabilitation

## 2024-02-27 VITALS — BP 137/85 | HR 64 | Ht 61.0 in | Wt 185.0 lb

## 2024-02-27 DIAGNOSIS — M5416 Radiculopathy, lumbar region: Secondary | ICD-10-CM

## 2024-02-27 MED ORDER — DEXAMETHASONE SOD PHOSPHATE PF 10 MG/ML IJ SOLN
20.0000 mg | Freq: Once | INTRAMUSCULAR | Status: AC
  Administered 2024-02-27: 20 mg

## 2024-02-27 MED ORDER — LIDOCAINE HCL 1 % IJ SOLN
10.0000 mL | Freq: Once | INTRAMUSCULAR | Status: AC
  Administered 2024-02-27: 10 mL

## 2024-02-27 MED ORDER — IOHEXOL 180 MG/ML  SOLN
3.0000 mL | Freq: Once | INTRAMUSCULAR | Status: AC
  Administered 2024-02-27: 3 mL

## 2024-02-27 MED ORDER — LIDOCAINE HCL (PF) 1 % IJ SOLN
4.0000 mL | Freq: Once | INTRAMUSCULAR | Status: AC
  Administered 2024-02-27: 4 mL

## 2024-02-27 NOTE — Progress Notes (Signed)
 Bilateral S1 transforaminal epidural steroid injection under fluoroscopic guidance with contrast enhancement  Indication: Lumbosacral radiculitis is not relieved by medication management or other conservative care and interfering with self-care and mobility. Reviewed last MRI from 2019 demonstrating Bilateral S1 and Right L5 impingement .  Prior ESI at another office with good relief  Informed consent was obtained after describing risk and benefits of the procedure with the patient, this includes bleeding, bruising, infection, paralysis and medication side effects.  The patient wishes to proceed and has given written consent.  Patient was placed in prone position.  The lumbar area was marked and prepped with Betadine.  It was entered with a 25-gauge 1-1/2 inch needle and one mL of 1% lidocaine  was injected into the skin and subcutaneous tissue.  Then a 22-gauge 5 spinal needle was inserted into the left  S1 sacral foramen under AP, lateral, and oblique view.  Once needle tip was within the foramen on lateral views an dnor exceeding 6 o clock position on th epedical on AP viewed Isovue 200 was injected x 2ml Then a solution containing one mL of 10 mg per mL dexamethasone  and 2 mL of 1% lidocaine  was injected.  The patient tolerated procedure well.  This same procedure was performed on the right side with same needle , injectate and technique.  Post procedure instructions were given.  Please see post procedure form.   Pre injection back pain /10 Post injection back pain /10

## 2024-02-27 NOTE — Addendum Note (Signed)
 Addended by: Hilery Wintle W on: 02/27/2024 04:11 PM   Modules accepted: Orders

## 2024-02-27 NOTE — Patient Instructions (Signed)

## 2024-03-01 ENCOUNTER — Telehealth (HOSPITAL_COMMUNITY): Admitting: Psychiatry
